# Patient Record
Sex: Male | Born: 1945
Health system: Southern US, Community
[De-identification: ages and names within clinical notes are randomized; demographics above are authoritative.]

## PROBLEM LIST (undated history)

## (undated) DIAGNOSIS — L720 Epidermal cyst: Secondary | ICD-10-CM

## (undated) DIAGNOSIS — Z79899 Other long term (current) drug therapy: Secondary | ICD-10-CM

## (undated) DIAGNOSIS — I2510 Atherosclerotic heart disease of native coronary artery without angina pectoris: Secondary | ICD-10-CM

## (undated) DIAGNOSIS — R0600 Dyspnea, unspecified: Secondary | ICD-10-CM

## (undated) DIAGNOSIS — I499 Cardiac arrhythmia, unspecified: Secondary | ICD-10-CM

## (undated) DIAGNOSIS — L02213 Cutaneous abscess of chest wall: Secondary | ICD-10-CM

## (undated) DIAGNOSIS — E785 Hyperlipidemia, unspecified: Secondary | ICD-10-CM

## (undated) DIAGNOSIS — K219 Gastro-esophageal reflux disease without esophagitis: Secondary | ICD-10-CM

## (undated) DIAGNOSIS — R222 Localized swelling, mass and lump, trunk: Secondary | ICD-10-CM

## (undated) DIAGNOSIS — I4891 Unspecified atrial fibrillation: Secondary | ICD-10-CM

## (undated) DIAGNOSIS — I517 Cardiomegaly: Secondary | ICD-10-CM

## (undated) DIAGNOSIS — S21109A Unspecified open wound of unspecified front wall of thorax without penetration into thoracic cavity, initial encounter: Secondary | ICD-10-CM

## (undated) DIAGNOSIS — C801 Malignant (primary) neoplasm, unspecified: Secondary | ICD-10-CM

## (undated) DIAGNOSIS — I25118 Atherosclerotic heart disease of native coronary artery with other forms of angina pectoris: Secondary | ICD-10-CM

## (undated) DIAGNOSIS — M1990 Unspecified osteoarthritis, unspecified site: Secondary | ICD-10-CM

## (undated) DIAGNOSIS — I480 Paroxysmal atrial fibrillation: Secondary | ICD-10-CM

## (undated) HISTORY — PX: CARDIAC CATHETERIZATION: SHX172

## (undated) HISTORY — DX: Epidermal cyst: L72.0

## (undated) HISTORY — PX: CHOLECYSTECTOMY: SHX55

## (undated) HISTORY — DX: Unspecified atrial fibrillation: I48.91

## (undated) HISTORY — PX: CYST EXCISION: SHX5701

## (undated) HISTORY — DX: Atherosclerotic heart disease of native coronary artery with other forms of angina pectoris: I25.118

## (undated) HISTORY — DX: Unspecified osteoarthritis, unspecified site: M19.90

## (undated) HISTORY — DX: Unspecified open wound of unspecified front wall of thorax without penetration into thoracic cavity, initial encounter: S21.109A

## (undated) HISTORY — DX: Cutaneous abscess of chest wall: L02.213

## (undated) HISTORY — DX: Paroxysmal atrial fibrillation: I48.0

## (undated) HISTORY — DX: Gastro-esophageal reflux disease without esophagitis: K21.9

## (undated) HISTORY — DX: Cardiomegaly: I51.7

## (undated) HISTORY — DX: Other long term (current) drug therapy: Z79.899

## (undated) HISTORY — DX: Hyperlipidemia, unspecified: E78.5

## (undated) HISTORY — PX: APPENDECTOMY: SHX54

## (undated) HISTORY — DX: Localized swelling, mass and lump, trunk: R22.2

---

## 2010-04-28 HISTORY — PX: LAPAROSCOPIC LYSIS INTESTINAL ADHESIONS: SUR778

## 2011-08-14 DIAGNOSIS — Z79899 Other long term (current) drug therapy: Secondary | ICD-10-CM | POA: Diagnosis not present

## 2011-08-14 DIAGNOSIS — E782 Mixed hyperlipidemia: Secondary | ICD-10-CM | POA: Diagnosis not present

## 2012-01-27 DIAGNOSIS — J209 Acute bronchitis, unspecified: Secondary | ICD-10-CM | POA: Diagnosis not present

## 2012-01-27 DIAGNOSIS — J019 Acute sinusitis, unspecified: Secondary | ICD-10-CM | POA: Diagnosis not present

## 2012-02-16 DIAGNOSIS — Z1211 Encounter for screening for malignant neoplasm of colon: Secondary | ICD-10-CM | POA: Diagnosis not present

## 2012-02-16 DIAGNOSIS — E782 Mixed hyperlipidemia: Secondary | ICD-10-CM | POA: Diagnosis not present

## 2012-02-16 DIAGNOSIS — Z23 Encounter for immunization: Secondary | ICD-10-CM | POA: Diagnosis not present

## 2012-02-16 DIAGNOSIS — Z79899 Other long term (current) drug therapy: Secondary | ICD-10-CM | POA: Diagnosis not present

## 2012-02-19 DIAGNOSIS — Z1211 Encounter for screening for malignant neoplasm of colon: Secondary | ICD-10-CM | POA: Diagnosis not present

## 2012-02-25 DIAGNOSIS — D1800 Hemangioma unspecified site: Secondary | ICD-10-CM | POA: Diagnosis not present

## 2012-02-25 DIAGNOSIS — D485 Neoplasm of uncertain behavior of skin: Secondary | ICD-10-CM | POA: Diagnosis not present

## 2012-05-03 DIAGNOSIS — B354 Tinea corporis: Secondary | ICD-10-CM | POA: Diagnosis not present

## 2012-05-03 DIAGNOSIS — N400 Enlarged prostate without lower urinary tract symptoms: Secondary | ICD-10-CM | POA: Diagnosis not present

## 2012-07-30 DIAGNOSIS — L259 Unspecified contact dermatitis, unspecified cause: Secondary | ICD-10-CM | POA: Diagnosis not present

## 2012-07-30 DIAGNOSIS — L820 Inflamed seborrheic keratosis: Secondary | ICD-10-CM | POA: Diagnosis not present

## 2012-09-15 DIAGNOSIS — K648 Other hemorrhoids: Secondary | ICD-10-CM | POA: Diagnosis not present

## 2012-10-01 DIAGNOSIS — D126 Benign neoplasm of colon, unspecified: Secondary | ICD-10-CM | POA: Diagnosis not present

## 2012-10-01 DIAGNOSIS — Z7982 Long term (current) use of aspirin: Secondary | ICD-10-CM | POA: Diagnosis not present

## 2012-10-01 DIAGNOSIS — Z1211 Encounter for screening for malignant neoplasm of colon: Secondary | ICD-10-CM | POA: Diagnosis not present

## 2012-10-01 DIAGNOSIS — Z79899 Other long term (current) drug therapy: Secondary | ICD-10-CM | POA: Diagnosis not present

## 2012-10-01 DIAGNOSIS — E7800 Pure hypercholesterolemia, unspecified: Secondary | ICD-10-CM | POA: Diagnosis not present

## 2012-10-01 DIAGNOSIS — K648 Other hemorrhoids: Secondary | ICD-10-CM | POA: Diagnosis not present

## 2012-10-27 DIAGNOSIS — Z79899 Other long term (current) drug therapy: Secondary | ICD-10-CM | POA: Diagnosis not present

## 2012-10-27 DIAGNOSIS — N139 Obstructive and reflux uropathy, unspecified: Secondary | ICD-10-CM | POA: Diagnosis not present

## 2012-10-27 DIAGNOSIS — N401 Enlarged prostate with lower urinary tract symptoms: Secondary | ICD-10-CM | POA: Diagnosis not present

## 2012-10-27 DIAGNOSIS — E782 Mixed hyperlipidemia: Secondary | ICD-10-CM | POA: Diagnosis not present

## 2012-10-27 DIAGNOSIS — N138 Other obstructive and reflux uropathy: Secondary | ICD-10-CM | POA: Diagnosis not present

## 2012-10-27 DIAGNOSIS — I4891 Unspecified atrial fibrillation: Secondary | ICD-10-CM | POA: Diagnosis not present

## 2012-12-23 DIAGNOSIS — E785 Hyperlipidemia, unspecified: Secondary | ICD-10-CM | POA: Diagnosis not present

## 2012-12-23 DIAGNOSIS — I4891 Unspecified atrial fibrillation: Secondary | ICD-10-CM | POA: Diagnosis not present

## 2013-02-11 DIAGNOSIS — Z23 Encounter for immunization: Secondary | ICD-10-CM | POA: Diagnosis not present

## 2013-03-01 DIAGNOSIS — H2510 Age-related nuclear cataract, unspecified eye: Secondary | ICD-10-CM | POA: Diagnosis not present

## 2013-03-01 DIAGNOSIS — H524 Presbyopia: Secondary | ICD-10-CM | POA: Diagnosis not present

## 2013-03-01 DIAGNOSIS — H547 Unspecified visual loss: Secondary | ICD-10-CM | POA: Diagnosis not present

## 2013-07-22 DIAGNOSIS — J301 Allergic rhinitis due to pollen: Secondary | ICD-10-CM | POA: Diagnosis not present

## 2013-11-10 DIAGNOSIS — N138 Other obstructive and reflux uropathy: Secondary | ICD-10-CM | POA: Diagnosis not present

## 2013-11-10 DIAGNOSIS — N139 Obstructive and reflux uropathy, unspecified: Secondary | ICD-10-CM | POA: Diagnosis not present

## 2013-11-10 DIAGNOSIS — L2089 Other atopic dermatitis: Secondary | ICD-10-CM | POA: Diagnosis not present

## 2013-11-10 DIAGNOSIS — N401 Enlarged prostate with lower urinary tract symptoms: Secondary | ICD-10-CM | POA: Diagnosis not present

## 2013-11-10 DIAGNOSIS — Z125 Encounter for screening for malignant neoplasm of prostate: Secondary | ICD-10-CM | POA: Diagnosis not present

## 2013-11-10 DIAGNOSIS — Z79899 Other long term (current) drug therapy: Secondary | ICD-10-CM | POA: Diagnosis not present

## 2013-11-10 DIAGNOSIS — E782 Mixed hyperlipidemia: Secondary | ICD-10-CM | POA: Diagnosis not present

## 2013-12-26 DIAGNOSIS — I4891 Unspecified atrial fibrillation: Secondary | ICD-10-CM | POA: Diagnosis not present

## 2013-12-26 DIAGNOSIS — E785 Hyperlipidemia, unspecified: Secondary | ICD-10-CM | POA: Diagnosis not present

## 2013-12-26 DIAGNOSIS — Z79899 Other long term (current) drug therapy: Secondary | ICD-10-CM | POA: Diagnosis not present

## 2014-03-02 DIAGNOSIS — H2513 Age-related nuclear cataract, bilateral: Secondary | ICD-10-CM | POA: Diagnosis not present

## 2014-03-10 DIAGNOSIS — Z23 Encounter for immunization: Secondary | ICD-10-CM | POA: Diagnosis not present

## 2014-08-25 DIAGNOSIS — L821 Other seborrheic keratosis: Secondary | ICD-10-CM | POA: Diagnosis not present

## 2014-08-25 DIAGNOSIS — B351 Tinea unguium: Secondary | ICD-10-CM | POA: Diagnosis not present

## 2014-08-25 DIAGNOSIS — L578 Other skin changes due to chronic exposure to nonionizing radiation: Secondary | ICD-10-CM | POA: Diagnosis not present

## 2014-08-25 DIAGNOSIS — L300 Nummular dermatitis: Secondary | ICD-10-CM | POA: Diagnosis not present

## 2014-08-25 DIAGNOSIS — L853 Xerosis cutis: Secondary | ICD-10-CM | POA: Diagnosis not present

## 2014-12-07 DIAGNOSIS — Z125 Encounter for screening for malignant neoplasm of prostate: Secondary | ICD-10-CM | POA: Diagnosis not present

## 2014-12-07 DIAGNOSIS — E782 Mixed hyperlipidemia: Secondary | ICD-10-CM | POA: Diagnosis not present

## 2014-12-07 DIAGNOSIS — N401 Enlarged prostate with lower urinary tract symptoms: Secondary | ICD-10-CM | POA: Diagnosis not present

## 2014-12-07 DIAGNOSIS — Z23 Encounter for immunization: Secondary | ICD-10-CM | POA: Diagnosis not present

## 2014-12-07 DIAGNOSIS — Z0000 Encounter for general adult medical examination without abnormal findings: Secondary | ICD-10-CM | POA: Diagnosis not present

## 2014-12-07 DIAGNOSIS — I4891 Unspecified atrial fibrillation: Secondary | ICD-10-CM | POA: Diagnosis not present

## 2014-12-07 DIAGNOSIS — Z79899 Other long term (current) drug therapy: Secondary | ICD-10-CM | POA: Diagnosis not present

## 2015-01-10 ENCOUNTER — Encounter: Payer: Self-pay | Admitting: Cardiology

## 2015-01-10 DIAGNOSIS — I480 Paroxysmal atrial fibrillation: Secondary | ICD-10-CM

## 2015-01-10 DIAGNOSIS — Z79899 Other long term (current) drug therapy: Secondary | ICD-10-CM | POA: Insufficient documentation

## 2015-01-10 HISTORY — DX: Paroxysmal atrial fibrillation: I48.0

## 2015-01-10 HISTORY — DX: Other long term (current) drug therapy: Z79.899

## 2015-03-08 DIAGNOSIS — H2513 Age-related nuclear cataract, bilateral: Secondary | ICD-10-CM | POA: Diagnosis not present

## 2015-03-20 DIAGNOSIS — J069 Acute upper respiratory infection, unspecified: Secondary | ICD-10-CM | POA: Diagnosis not present

## 2015-04-09 DIAGNOSIS — L300 Nummular dermatitis: Secondary | ICD-10-CM | POA: Diagnosis not present

## 2015-09-26 DIAGNOSIS — K648 Other hemorrhoids: Secondary | ICD-10-CM | POA: Diagnosis not present

## 2015-10-12 DIAGNOSIS — Z9049 Acquired absence of other specified parts of digestive tract: Secondary | ICD-10-CM | POA: Diagnosis not present

## 2015-10-12 DIAGNOSIS — Z86010 Personal history of colonic polyps: Secondary | ICD-10-CM | POA: Diagnosis not present

## 2015-10-12 DIAGNOSIS — I4891 Unspecified atrial fibrillation: Secondary | ICD-10-CM | POA: Diagnosis not present

## 2015-10-12 DIAGNOSIS — K648 Other hemorrhoids: Secondary | ICD-10-CM | POA: Diagnosis not present

## 2015-10-12 DIAGNOSIS — Z1211 Encounter for screening for malignant neoplasm of colon: Secondary | ICD-10-CM | POA: Diagnosis not present

## 2015-10-12 DIAGNOSIS — Z79899 Other long term (current) drug therapy: Secondary | ICD-10-CM | POA: Diagnosis not present

## 2015-10-12 DIAGNOSIS — K644 Residual hemorrhoidal skin tags: Secondary | ICD-10-CM | POA: Diagnosis not present

## 2015-11-14 DIAGNOSIS — L723 Sebaceous cyst: Secondary | ICD-10-CM | POA: Diagnosis not present

## 2015-12-04 DIAGNOSIS — L02213 Cutaneous abscess of chest wall: Secondary | ICD-10-CM | POA: Insufficient documentation

## 2015-12-04 DIAGNOSIS — L720 Epidermal cyst: Secondary | ICD-10-CM

## 2015-12-04 DIAGNOSIS — L723 Sebaceous cyst: Secondary | ICD-10-CM | POA: Diagnosis not present

## 2015-12-04 DIAGNOSIS — R222 Localized swelling, mass and lump, trunk: Secondary | ICD-10-CM

## 2015-12-04 HISTORY — DX: Localized swelling, mass and lump, trunk: R22.2

## 2015-12-04 HISTORY — DX: Epidermal cyst: L72.0

## 2015-12-04 HISTORY — DX: Cutaneous abscess of chest wall: L02.213

## 2015-12-18 DIAGNOSIS — L723 Sebaceous cyst: Secondary | ICD-10-CM | POA: Diagnosis not present

## 2016-01-03 DIAGNOSIS — L2089 Other atopic dermatitis: Secondary | ICD-10-CM | POA: Diagnosis not present

## 2016-01-03 DIAGNOSIS — Z125 Encounter for screening for malignant neoplasm of prostate: Secondary | ICD-10-CM | POA: Diagnosis not present

## 2016-01-03 DIAGNOSIS — E782 Mixed hyperlipidemia: Secondary | ICD-10-CM | POA: Diagnosis not present

## 2016-01-03 DIAGNOSIS — I4891 Unspecified atrial fibrillation: Secondary | ICD-10-CM | POA: Diagnosis not present

## 2016-01-03 DIAGNOSIS — L409 Psoriasis, unspecified: Secondary | ICD-10-CM | POA: Diagnosis not present

## 2016-01-03 DIAGNOSIS — Z0000 Encounter for general adult medical examination without abnormal findings: Secondary | ICD-10-CM | POA: Diagnosis not present

## 2016-01-03 DIAGNOSIS — Z79899 Other long term (current) drug therapy: Secondary | ICD-10-CM | POA: Diagnosis not present

## 2016-01-15 DIAGNOSIS — L723 Sebaceous cyst: Secondary | ICD-10-CM | POA: Diagnosis not present

## 2016-01-19 DIAGNOSIS — M5432 Sciatica, left side: Secondary | ICD-10-CM | POA: Diagnosis not present

## 2016-03-10 ENCOUNTER — Encounter: Payer: Self-pay | Admitting: Cardiology

## 2016-03-10 DIAGNOSIS — I480 Paroxysmal atrial fibrillation: Secondary | ICD-10-CM | POA: Diagnosis not present

## 2016-03-10 DIAGNOSIS — Z6829 Body mass index (BMI) 29.0-29.9, adult: Secondary | ICD-10-CM | POA: Diagnosis not present

## 2016-03-10 DIAGNOSIS — Z79899 Other long term (current) drug therapy: Secondary | ICD-10-CM | POA: Diagnosis not present

## 2016-03-11 DIAGNOSIS — L723 Sebaceous cyst: Secondary | ICD-10-CM | POA: Diagnosis not present

## 2016-03-11 DIAGNOSIS — L720 Epidermal cyst: Secondary | ICD-10-CM | POA: Diagnosis not present

## 2016-03-13 DIAGNOSIS — Z79899 Other long term (current) drug therapy: Secondary | ICD-10-CM | POA: Diagnosis not present

## 2016-03-13 DIAGNOSIS — I480 Paroxysmal atrial fibrillation: Secondary | ICD-10-CM | POA: Diagnosis not present

## 2016-03-13 DIAGNOSIS — H2513 Age-related nuclear cataract, bilateral: Secondary | ICD-10-CM | POA: Diagnosis not present

## 2016-03-13 DIAGNOSIS — H524 Presbyopia: Secondary | ICD-10-CM | POA: Diagnosis not present

## 2016-03-25 DIAGNOSIS — R222 Localized swelling, mass and lump, trunk: Secondary | ICD-10-CM | POA: Diagnosis not present

## 2016-04-01 DIAGNOSIS — S21109A Unspecified open wound of unspecified front wall of thorax without penetration into thoracic cavity, initial encounter: Secondary | ICD-10-CM

## 2016-04-01 HISTORY — DX: Unspecified open wound of unspecified front wall of thorax without penetration into thoracic cavity, initial encounter: S21.109A

## 2016-04-29 DIAGNOSIS — L723 Sebaceous cyst: Secondary | ICD-10-CM | POA: Diagnosis not present

## 2016-04-29 DIAGNOSIS — S21109S Unspecified open wound of unspecified front wall of thorax without penetration into thoracic cavity, sequela: Secondary | ICD-10-CM | POA: Diagnosis not present

## 2016-09-15 DIAGNOSIS — Z6828 Body mass index (BMI) 28.0-28.9, adult: Secondary | ICD-10-CM | POA: Diagnosis not present

## 2016-09-15 DIAGNOSIS — R1032 Left lower quadrant pain: Secondary | ICD-10-CM | POA: Diagnosis not present

## 2017-01-05 DIAGNOSIS — J309 Allergic rhinitis, unspecified: Secondary | ICD-10-CM | POA: Diagnosis not present

## 2017-01-05 DIAGNOSIS — Z23 Encounter for immunization: Secondary | ICD-10-CM | POA: Diagnosis not present

## 2017-01-05 DIAGNOSIS — Z6828 Body mass index (BMI) 28.0-28.9, adult: Secondary | ICD-10-CM | POA: Diagnosis not present

## 2017-01-15 DIAGNOSIS — E785 Hyperlipidemia, unspecified: Secondary | ICD-10-CM

## 2017-01-15 DIAGNOSIS — I517 Cardiomegaly: Secondary | ICD-10-CM

## 2017-01-15 DIAGNOSIS — M1990 Unspecified osteoarthritis, unspecified site: Secondary | ICD-10-CM

## 2017-01-15 DIAGNOSIS — K219 Gastro-esophageal reflux disease without esophagitis: Secondary | ICD-10-CM

## 2017-01-15 HISTORY — DX: Gastro-esophageal reflux disease without esophagitis: K21.9

## 2017-01-15 HISTORY — DX: Cardiomegaly: I51.7

## 2017-01-15 HISTORY — DX: Hyperlipidemia, unspecified: E78.5

## 2017-01-15 HISTORY — DX: Unspecified osteoarthritis, unspecified site: M19.90

## 2017-02-16 ENCOUNTER — Telehealth: Payer: Self-pay | Admitting: Cardiology

## 2017-02-16 ENCOUNTER — Other Ambulatory Visit: Payer: Self-pay

## 2017-02-16 MED ORDER — METOPROLOL SUCCINATE ER 50 MG PO TB24: 50.0000 mg | ORAL_TABLET | Freq: Every day | ORAL | 0 refills | Status: DC

## 2017-02-16 NOTE — Telephone Encounter (Signed)
°*  STAT* If patient is at the pharmacy, call can be transferred to refill team.   1. Which medications need to be refilled? (please list name of each medication and dose if known) Metoprolol 50mg   2. Which pharmacy/location (including street and city if local pharmacy) is medication to be sent to?  [facility]   3. Do they need a 30 day or 90 day supply?30  Patient is scheduled for 03/11/17

## 2017-02-16 NOTE — Telephone Encounter (Signed)
Refill sent.

## 2017-03-11 ENCOUNTER — Ambulatory Visit: Payer: Self-pay | Admitting: Cardiology

## 2017-03-12 ENCOUNTER — Ambulatory Visit (INDEPENDENT_AMBULATORY_CARE_PROVIDER_SITE_OTHER): Payer: Medicare Other | Admitting: Cardiology

## 2017-03-12 ENCOUNTER — Encounter: Payer: Self-pay | Admitting: Cardiology

## 2017-03-12 VITALS — BP 130/80 | HR 60 | Ht 72.0 in | Wt 223.0 lb

## 2017-03-12 DIAGNOSIS — Z79899 Other long term (current) drug therapy: Secondary | ICD-10-CM | POA: Diagnosis not present

## 2017-03-12 DIAGNOSIS — I480 Paroxysmal atrial fibrillation: Secondary | ICD-10-CM | POA: Diagnosis not present

## 2017-03-12 DIAGNOSIS — E785 Hyperlipidemia, unspecified: Secondary | ICD-10-CM

## 2017-03-12 MED ORDER — METOPROLOL SUCCINATE ER 50 MG PO TB24: 50.0000 mg | ORAL_TABLET | ORAL | 0 refills | Status: DC

## 2017-03-12 MED ORDER — FLECAINIDE ACETATE 50 MG PO TABS: 50.0000 mg | ORAL_TABLET | Freq: Two times a day (BID) | ORAL | 3 refills | Status: DC

## 2017-03-12 MED ORDER — SIMVASTATIN 40 MG PO TABS: 40.0000 mg | ORAL_TABLET | Freq: Every day | ORAL | 3 refills | Status: DC

## 2017-03-12 NOTE — Progress Notes (Signed)
Cardiology Office Note:    Date:  03/12/2017   ID:  Steve Frye, DOB 07-08-1945, MRN [id number]  PCP:  Myer Peer, MD  Cardiologist:  Shirlee More, MD    Referring MD: Myer Peer, MD    ASSESSMENT:    1. PAF (paroxysmal atrial fibrillation) ([place])   2. High risk medication use   3. Hyperlipidemia, unspecified hyperlipidemia type    PLAN:    In order of problems listed above:   1. Stable remains in sinus rhythm on the minimum dose of flecainide with a resting heart rate of 50 10 reduce his beta-blocker to every other day I have asked him to check heart rate at home at least once a week and contact me if he is having persistent bradycardia.  EKG shows no evidence of flecainide toxicity drug level be checked today. 2. Stable no evidence of toxicity on EKG check flecainide level 3. Stable check CMP for safety lipid profile for efficacy and continue his intermediate intensity statin.    Next appointment: One year in the [place] office   Medication Adjustments/Labs and Tests Ordered: Current medicines are reviewed at length with the patient today.  Concerns regarding medicines are outlined above.  Orders Placed This Encounter  Procedures  . Flecainide level  . Comprehensive Metabolic Panel (CMET)  . Lipid Profile  . EKG 12-Lead   Meds ordered this encounter  Medications  . DISCONTD: metoprolol succinate (TOPROL-XL) 50 MG 24 hr tablet    Sig: Take 1 tablet (50 mg total) every other day by mouth.    Dispense:  30 tablet    Refill:  0  . simvastatin (ZOCOR) 40 MG tablet    Sig: Take 1 tablet (40 mg total) daily by mouth.    Dispense:  90 tablet    Refill:  3  . metoprolol succinate (TOPROL-XL) 50 MG 24 hr tablet    Sig: Take 1 tablet (50 mg total) every other day by mouth.    Dispense:  90 tablet    Refill:  0  . flecainide (TAMBOCOR) 50 MG tablet    Sig: Take 1 tablet (50 mg total) 2 (two) times daily by mouth.    Dispense:  180 tablet    Refill:  3     Chief Complaint  Patient presents with  . Follow-up    atrial fibrillation    History of Present Illness:    Steve Frye is a 71 y.o. male with a hx of paroxysmal atrial fibrillation CHADS vasc 2 =1  last seen 1 year ago.. Compliance with diet, lifestyle and medications: Yes He has had no palpitation rapid heart rhythms syncope exercise intolerance chest pain shortness of breath or TIA.  He has had no perceived side effects from his cardiac medications.  He is unaware of a resting heart rate of 51 bpm today Past Medical History:  Diagnosis Date  . Arthritis 01/15/2017  . Chest mass 12/04/2015  . Cutaneous abscess of chest wall 12/04/2015  . Epidermoid cyst of skin 12/04/2015  . GERD (gastroesophageal reflux disease) 01/15/2017  . High risk medication use 01/10/2015   Overview:  Overview:  flecanide  . Hyperlipidemia 01/15/2017  . LAE (left atrial enlargement) 01/15/2017  . Open wound of chest wall 04/01/2016  . PAF (paroxysmal atrial fibrillation) ([place]) 01/10/2015   Overview:  CHADS2 vasc score=1    Past Surgical History:  Procedure Laterality Date  . APPENDECTOMY    . CHOLECYSTECTOMY    . LAPAROSCOPIC LYSIS INTESTINAL  ADHESIONS  2012    Current Medications: Current Meds  Medication Sig  . aspirin EC 81 MG tablet Take 81 mg daily by mouth.  . bifidobacterium infantis (ALIGN) capsule Take 1 capsule daily by mouth.  . finasteride (PROSCAR) 5 MG tablet Take 5 mg daily by mouth.  . flecainide (TAMBOCOR) 50 MG tablet Take 1 tablet (50 mg total) 2 (two) times daily by mouth.  . fluticasone (FLONASE) 50 MCG/ACT nasal spray Place 1 spray 2 (two) times daily into both nostrils.  . metoprolol succinate (TOPROL-XL) 50 MG 24 hr tablet Take 1 tablet (50 mg total) every other day by mouth.  . Nutritional Supplements (FRUIT & VEGETABLE DAILY PO) Take 2 tablets daily by mouth.  . simvastatin (ZOCOR) 40 MG tablet Take 1 tablet (40 mg total) daily by mouth.  . [DISCONTINUED] flecainide (TAMBOCOR)  50 MG tablet Take 50 mg 2 (two) times daily by mouth.  . [DISCONTINUED] metoprolol succinate (TOPROL-XL) 50 MG 24 hr tablet Take 1 tablet (50 mg total) by mouth daily.  . [DISCONTINUED] metoprolol succinate (TOPROL-XL) 50 MG 24 hr tablet Take 1 tablet (50 mg total) every other day by mouth.  . [DISCONTINUED] simvastatin (ZOCOR) 40 MG tablet Take 1 tablet daily by mouth.     Allergies:   Penicillins   Social History   Socioeconomic History  . Marital status: Married    Spouse name: None  . Number of children: None  . Years of education: None  . Highest education level: None  Social Needs  . Financial resource strain: None  . Food insecurity - worry: None  . Food insecurity - inability: None  . Transportation needs - medical: None  . Transportation needs - non-medical: None  Occupational History  . None  Tobacco Use  . Smoking status: Former Smoker    Years: 20.00    Types: Cigarettes    Last attempt to quit: 01/10/1979    Years since quitting: 38.1  . Smokeless tobacco: Never Used  Substance and Sexual Activity  . Alcohol use: No  . Drug use: No  . Sexual activity: None  Other Topics Concern  . None  Social History Narrative  . None     Family History: The patient's family history includes Heart disease in his father; Heart failure in his father. ROS:   Please see the history of present illness.    All other systems reviewed and are negative.  EKGs/Labs/Other Studies Reviewed:    The following studies were reviewed today:  EKG:  EKG ordered today.  The ekg ordered today demonstrates sinus rhythm normal including conduction intervals  Recent Labs: No results found for requested labs within last 8760 hours.  Recent Lipid Panel No results found for: CHOL, TRIG, HDL, CHOLHDL, VLDL, LDLCALC, LDLDIRECT  Physical Exam:    VS:  BP 130/80 (BP Location: Right Arm, Patient Position: Sitting)   Pulse 60   Ht 6' (1.829 m)   Wt 223 lb (101.2 kg)   SpO2 97%   BMI 30.24  kg/m     Wt Readings from Last 3 Encounters:  03/12/17 223 lb (101.2 kg)     GEN:  Well nourished, well developed in no acute distress HEENT: Normal NECK: No JVD; No carotid bruits LYMPHATICS: No lymphadenopathy CARDIAC: RRR, no murmurs, rubs, gallops RESPIRATORY:  Clear to auscultation without rales, wheezing or rhonchi  ABDOMEN: Soft, non-tender, non-distended MUSCULOSKELETAL:  No edema; No deformity  SKIN: Warm and dry NEUROLOGIC:  Alert and oriented x 3  PSYCHIATRIC:  Normal affect    Signed, Shirlee More, MD  03/12/2017 10:04 AM    [place]

## 2017-03-12 NOTE — Patient Instructions (Signed)
Medication Instructions:  Your physician has recommended you make the following change in your medication:  DECREASE metoprolol to every other day  Labwork: Your physician recommends that you return for lab work in: today. CMP, lipid, flecainide  Testing/Procedures: You had an EKG today.  Follow-Up: Your physician wants you to follow-up in: 1 year. You will receive a reminder letter in the mail two months in advance. If you don't receive a letter, please call our office to schedule the follow-up appointment.  Any Other Special Instructions Will Be Listed Below (If Applicable).     If you need a refill on your cardiac medications before your next appointment, please call your pharmacy.

## 2017-03-14 LAB — LIPID PANEL
CHOL/HDL RATIO: 3.8 ratio (ref 0.0–5.0)
Cholesterol, Total: 150 mg/dL (ref 100–199)
HDL: 40 mg/dL (ref >39)
LDL Calculated: 86 mg/dL (ref 0–99)
Triglycerides: 122 mg/dL (ref 0–149)
VLDL CHOLESTEROL CAL: 24 mg/dL (ref 5–40)

## 2017-03-14 LAB — COMPREHENSIVE METABOLIC PANEL
ALT: 29 IU/L (ref 0–44)
AST: 21 IU/L (ref 0–40)
Albumin/Globulin Ratio: 1.6 (ref 1.2–2.2)
Albumin: 4.2 g/dL (ref 3.5–4.8)
Alkaline Phosphatase: 73 IU/L (ref 39–117)
BUN/Creatinine Ratio: 9 — ABNORMAL LOW (ref 10–24)
BUN: 9 mg/dL (ref 8–27)
Bilirubin Total: 0.8 mg/dL (ref 0.0–1.2)
CALCIUM: 8.9 mg/dL (ref 8.6–10.2)
CO2: 24 mmol/L (ref 20–29)
Chloride: 103 mmol/L (ref 96–106)
Creatinine, Ser: 0.95 mg/dL (ref 0.76–1.27)
GFR, EST AFRICAN AMERICAN: 92 mL/min/{1.73_m2} (ref >59)
GFR, EST NON AFRICAN AMERICAN: 80 mL/min/{1.73_m2} (ref >59)
GLUCOSE: 106 mg/dL — AB (ref 65–99)
Globulin, Total: 2.6 g/dL (ref 1.5–4.5)
Potassium: 4.5 mmol/L (ref 3.5–5.2)
Sodium: 138 mmol/L (ref 134–144)
TOTAL PROTEIN: 6.8 g/dL (ref 6.0–8.5)

## 2017-03-14 LAB — FLECAINIDE LEVEL: Flecainide: 0.21 ug/mL (ref 0.20–1.00)

## 2017-07-02 DIAGNOSIS — H2513 Age-related nuclear cataract, bilateral: Secondary | ICD-10-CM | POA: Diagnosis not present

## 2017-07-02 DIAGNOSIS — H524 Presbyopia: Secondary | ICD-10-CM | POA: Diagnosis not present

## 2017-07-14 DIAGNOSIS — J310 Chronic rhinitis: Secondary | ICD-10-CM | POA: Diagnosis not present

## 2017-07-14 DIAGNOSIS — J0100 Acute maxillary sinusitis, unspecified: Secondary | ICD-10-CM | POA: Diagnosis not present

## 2017-07-14 DIAGNOSIS — J309 Allergic rhinitis, unspecified: Secondary | ICD-10-CM | POA: Diagnosis not present

## 2017-07-14 DIAGNOSIS — K219 Gastro-esophageal reflux disease without esophagitis: Secondary | ICD-10-CM | POA: Diagnosis not present

## 2017-08-14 ENCOUNTER — Other Ambulatory Visit: Payer: Self-pay

## 2017-08-14 ENCOUNTER — Telehealth: Payer: Self-pay | Admitting: Cardiology

## 2017-08-14 DIAGNOSIS — I480 Paroxysmal atrial fibrillation: Secondary | ICD-10-CM

## 2017-08-14 MED ORDER — METOPROLOL SUCCINATE ER 50 MG PO TB24: 50.0000 mg | ORAL_TABLET | ORAL | 1 refills | Status: DC

## 2017-08-14 NOTE — Telephone Encounter (Signed)
[facility] [address] [facility] [place]

## 2017-08-14 NOTE — Telephone Encounter (Signed)
Med refill sent

## 2017-08-21 DIAGNOSIS — L259 Unspecified contact dermatitis, unspecified cause: Secondary | ICD-10-CM | POA: Diagnosis not present

## 2017-09-28 DIAGNOSIS — C44619 Basal cell carcinoma of skin of left upper limb, including shoulder: Secondary | ICD-10-CM | POA: Diagnosis not present

## 2017-09-28 DIAGNOSIS — R233 Spontaneous ecchymoses: Secondary | ICD-10-CM | POA: Diagnosis not present

## 2017-10-07 DIAGNOSIS — C44619 Basal cell carcinoma of skin of left upper limb, including shoulder: Secondary | ICD-10-CM | POA: Diagnosis not present

## 2018-02-08 DIAGNOSIS — Z6827 Body mass index (BMI) 27.0-27.9, adult: Secondary | ICD-10-CM | POA: Diagnosis not present

## 2018-02-08 DIAGNOSIS — Z23 Encounter for immunization: Secondary | ICD-10-CM | POA: Diagnosis not present

## 2018-02-08 DIAGNOSIS — J309 Allergic rhinitis, unspecified: Secondary | ICD-10-CM | POA: Diagnosis not present

## 2018-02-08 DIAGNOSIS — Z0000 Encounter for general adult medical examination without abnormal findings: Secondary | ICD-10-CM | POA: Diagnosis not present

## 2018-02-17 DIAGNOSIS — J301 Allergic rhinitis due to pollen: Secondary | ICD-10-CM | POA: Diagnosis not present

## 2018-03-18 ENCOUNTER — Other Ambulatory Visit: Payer: Self-pay

## 2018-03-18 MED ORDER — SIMVASTATIN 40 MG PO TABS: 40.0000 mg | ORAL_TABLET | Freq: Every day | ORAL | 0 refills | Status: DC

## 2018-03-19 ENCOUNTER — Other Ambulatory Visit: Payer: Self-pay

## 2018-03-24 ENCOUNTER — Telehealth: Payer: Self-pay

## 2018-03-24 MED ORDER — FLECAINIDE ACETATE 50 MG PO TABS: 50.0000 mg | ORAL_TABLET | Freq: Two times a day (BID) | ORAL | 0 refills | Status: DC

## 2018-03-24 NOTE — Telephone Encounter (Signed)
Rx sent to pharmacy as requested.

## 2018-05-04 NOTE — Progress Notes (Signed)
Cardiology Office Note:    Date:  05/05/2018   ID:  Steve Frye, DOB 12-Feb-1946, MRN [id number]  PCP:  Myer Peer, MD  Cardiologist:  Shirlee More, MD    Referring MD: Myer Peer, MD    ASSESSMENT:    1. PAF (paroxysmal atrial fibrillation) ([place])   2. High risk medication use   3. Hyperlipidemia, unspecified hyperlipidemia type    PLAN:    In order of problems listed above:  1. Stable no clinical recurrence tolerates flecainide continue the same no evidence of toxicity and EKG and presently is not anticoagulated and I would not consider unless he has clinical recurrence.  He is a very low stroke risk and continue to take low-dose aspirin. 2. Stable no evidence of toxicity check CMP for liver function 3. Continue his statin he is well over a year from his last labs I will draw a CMP and lipid profile that can be shared with his primary care physician and continue his intermediate intensity statin   Next appointment: 1 year   Medication Adjustments/Labs and Tests Ordered: Current medicines are reviewed at length with the patient today.  Concerns regarding medicines are outlined above.  Orders Placed This Encounter  Procedures  . Comp Met (CMET)  . Lipid Profile  . EKG 12-Lead   No orders of the defined types were placed in this encounter.   Chief Complaint  Patient presents with  . Follow-up    on flecanide  . Atrial Fibrillation    History of Present Illness:    Steve Frye is a 73 y.o. male with a hx of PAF CHADSvasc 2 of 1 on flecanide last seen 03/12/17. Compliance with diet, lifestyle and medications: Yes  He is in a good lifestyle and exercises regularly and has had no exercise intolerance edema shortness of breath chest pain palpitations syncope or TIA.  He tolerates flecainide without side effects and has had no indication of recurrent atrial fibrillation Past Medical History:  Diagnosis Date  . Arthritis 01/15/2017  . Chest mass 12/04/2015  .  Cutaneous abscess of chest wall 12/04/2015  . Epidermoid cyst of skin 12/04/2015  . GERD (gastroesophageal reflux disease) 01/15/2017  . High risk medication use 01/10/2015   Overview:  Overview:  flecanide  . Hyperlipidemia 01/15/2017  . LAE (left atrial enlargement) 01/15/2017  . Open wound of chest wall 04/01/2016  . PAF (paroxysmal atrial fibrillation) ([place]) 01/10/2015   Overview:  CHADS2 vasc score=1    Past Surgical History:  Procedure Laterality Date  . APPENDECTOMY    . CHOLECYSTECTOMY    . CYST EXCISION    . LAPAROSCOPIC LYSIS INTESTINAL ADHESIONS  2012    Current Medications: Current Meds  Medication Sig  . aspirin EC 81 MG tablet Take 81 mg daily by mouth.  Marland Kitchen azelastine (ASTELIN) 0.1 % nasal spray Place 1 spray into both nostrils 2 (two) times daily.  . bifidobacterium infantis (ALIGN) capsule Take 1 capsule daily by mouth.  . fexofenadine (ALLEGRA) 180 MG tablet Take 180 mg by mouth daily.  . finasteride (PROSCAR) 5 MG tablet Take 5 mg daily by mouth.  . flecainide (TAMBOCOR) 50 MG tablet Take 1 tablet (50 mg total) by mouth 2 (two) times daily.  . fluticasone (FLONASE) 50 MCG/ACT nasal spray Place 1 spray 2 (two) times daily into both nostrils.  . metoprolol succinate (TOPROL-XL) 50 MG 24 hr tablet Take 1 tablet (50 mg total) by mouth every other day.  . montelukast (SINGULAIR) 10  MG tablet Take 10 mg by mouth at bedtime.  . Nutritional Supplements (FRUIT & VEGETABLE DAILY PO) Take 2 tablets daily by mouth.  . simvastatin (ZOCOR) 40 MG tablet Take 1 tablet (40 mg total) by mouth daily.     Allergies:   Penicillins   Social History   Socioeconomic History  . Marital status: Married    Spouse name: Not on file  . Number of children: Not on file  . Years of education: Not on file  . Highest education level: Not on file  Occupational History  . Not on file  Social Needs  . Financial resource strain: Not on file  . Food insecurity:    Worry: Not on file    Inability:  Not on file  . Transportation needs:    Medical: Not on file    Non-medical: Not on file  Tobacco Use  . Smoking status: Former Smoker    Years: 20.00    Types: Cigarettes    Last attempt to quit: 01/10/1979    Years since quitting: 39.3  . Smokeless tobacco: Never Used  Substance and Sexual Activity  . Alcohol use: No  . Drug use: No  . Sexual activity: Not on file  Lifestyle  . Physical activity:    Days per week: Not on file    Minutes per session: Not on file  . Stress: Not on file  Relationships  . Social connections:    Talks on phone: Not on file    Gets together: Not on file    Attends religious service: Not on file    Active member of club or organization: Not on file    Attends meetings of clubs or organizations: Not on file    Relationship status: Not on file  Other Topics Concern  . Not on file  Social History Narrative  . Not on file     Family History: The patient's family history includes Heart disease in his father; Heart failure in his father. ROS:   Please see the history of present illness.    All other systems reviewed and are negative.  EKGs/Labs/Other Studies Reviewed:    The following studies were reviewed today:  EKG:  EKG ordered today.  The ekg ordered today demonstrates SRTh and normal EKG  Recent Labs: No results found for requested labs within last 8760 hours.  Recent Lipid Panel    Component Value Date/Time   CHOL 150 03/12/2017 1034   TRIG 122 03/12/2017 1034   HDL 40 03/12/2017 1034   CHOLHDL 3.8 03/12/2017 1034   LDLCALC 86 03/12/2017 1034    Physical Exam:    VS:  BP 130/72 (BP Location: Right Arm, Patient Position: Sitting, Cuff Size: Normal)   Pulse 64   Ht 6' (1.829 m)   Wt 217 lb 2 oz (98.5 kg)   SpO2 96%   BMI 29.45 kg/m     Wt Readings from Last 3 Encounters:  05/05/18 217 lb 2 oz (98.5 kg)  03/12/17 223 lb (101.2 kg)     GEN:  Well nourished, well developed in no acute distress HEENT: Normal NECK: No  JVD; No carotid bruits LYMPHATICS: No lymphadenopathy CARDIAC: RRR, no murmurs, rubs, gallops RESPIRATORY:  Clear to auscultation without rales, wheezing or rhonchi  ABDOMEN: Soft, non-tender, non-distended MUSCULOSKELETAL:  No edema; No deformity  SKIN: Warm and dry NEUROLOGIC:  Alert and oriented x 3 PSYCHIATRIC:  Normal affect    Signed, Shirlee More, MD  05/05/2018 5:37 PM  [facility]

## 2018-05-05 ENCOUNTER — Ambulatory Visit (INDEPENDENT_AMBULATORY_CARE_PROVIDER_SITE_OTHER): Payer: Medicare Other | Admitting: Cardiology

## 2018-05-05 ENCOUNTER — Encounter: Payer: Self-pay | Admitting: Cardiology

## 2018-05-05 VITALS — BP 130/72 | HR 64 | Ht 72.0 in | Wt 217.1 lb

## 2018-05-05 DIAGNOSIS — Z79899 Other long term (current) drug therapy: Secondary | ICD-10-CM

## 2018-05-05 DIAGNOSIS — I480 Paroxysmal atrial fibrillation: Secondary | ICD-10-CM

## 2018-05-05 DIAGNOSIS — E785 Hyperlipidemia, unspecified: Secondary | ICD-10-CM

## 2018-05-05 NOTE — Patient Instructions (Addendum)
Medication Instructions:  Your physician recommends that you continue on your current medications as directed. Please refer to the Current Medication list given to you today.  If you need a refill on your cardiac medications before your next appointment, please call your pharmacy.   Lab work: You had a CMP drawn today.  If you have labs (blood work) drawn today and your tests are completely normal, you will receive your results only by: Marland Kitchen MyChart Message (if you have MyChart) OR . A paper copy in the mail If you have any lab test that is abnormal or we need to change your treatment, we will call you to review the results.  Testing/Procedures: None  Follow-Up: At [facility], you and your health needs are our priority.  As part of our continuing mission to provide you with exceptional heart care, we have created designated Provider Care Teams.  These Care Teams include your primary Cardiologist (physician) and Advanced Practice Providers (APPs -  Physician Assistants and Nurse Practitioners) who all work together to provide you with the care you need, when you need it. You will need a follow up appointment in 1 years.  Please call our office 2 months in advance to schedule this appointment.     Atrial Fibrillation  Atrial fibrillation is a type of heartbeat that is irregular or fast (rapid). If you have this condition, your heart beats without any order. This makes it hard for your heart to pump blood in a normal way. Having this condition gives you more risk for stroke, heart failure, and other heart problems. Atrial fibrillation may start all of a sudden and then stop on its own, or it may become a long-lasting problem. What are the causes? This condition may be caused by heart conditions, such as:  High blood pressure.  Heart failure.  Heart valve disease.  Heart surgery. Other causes include:  Pneumonia.  Obstructive sleep apnea.  Lung cancer.  Thyroid  disease.  Drinking too much alcohol. Sometimes the cause is not known. What increases the risk? You are more likely to develop this condition if:  You smoke.  You are older.  You have diabetes.  You are overweight.  You have a family history of this condition.  You exercise often and hard. What are the signs or symptoms? Common symptoms of this condition include:  A feeling like your heart is beating very fast.  Chest pain.  Feeling short of breath.  Feeling light-headed or weak.  Getting tired easily. Follow these instructions at home: Medicines  Take over-the-counter and prescription medicines only as told by your doctor.  If your doctor gives you a blood-thinning medicine, take it exactly as told. Taking too much of it can cause bleeding. Taking too little of it does not protect you against clots. Clots can cause a stroke. Lifestyle      Do not use any tobacco products. These include cigarettes, chewing tobacco, and e-cigarettes. If you need help quitting, ask your doctor.  Do not drink alcohol.  Do not drink beverages that have caffeine. These include coffee, soda, and tea.  Follow diet instructions as told by your doctor.  Exercise regularly as told by your doctor. General instructions  If you have a condition that causes breathing to stop for a short period of time (apnea), treat it as told by your doctor.  Keep a healthy weight. Do not use diet pills unless your doctor says they are safe for you. Diet pills may make heart  problems worse.  Keep all follow-up visits as told by your doctor. This is important. Contact a doctor if:  You notice a change in the speed, rhythm, or strength of your heartbeat.  You are taking a blood-thinning medicine and you see more bruising.  You get tired more easily when you move or exercise.  You have a sudden change in weight. Get help right away if:   You have pain in your chest or your belly (abdomen).  You  have trouble breathing.  You have blood in your vomit, poop, or pee (urine).  You have any signs of a stroke. "BE FAST" is an easy way to remember the main warning signs: ? B - Balance. Signs are dizziness, sudden trouble walking, or loss of balance. ? E - Eyes. Signs are trouble seeing or a change in how you see. ? F - Face. Signs are sudden weakness or loss of feeling in the face, or the face or eyelid drooping on one side. ? A - Arms. Signs are weakness or loss of feeling in an arm. This happens suddenly and usually on one side of the body. ? S - Speech. Signs are sudden trouble speaking, slurred speech, or trouble understanding what people say. ? T - Time. Time to call emergency services. Write down what time symptoms started.  You have other signs of a stroke, such as: ? A sudden, very bad headache with no known cause. ? Feeling sick to your stomach (nausea). ? Throwing up (vomiting). ? Jerky movements you cannot control (seizure). These symptoms may be an emergency. Do not wait to see if the symptoms will go away. Get medical help right away. Call your local emergency services (911 in the [place] Do not drive yourself to the hospital. Summary  Atrial fibrillation is a type of heartbeat that is irregular or fast (rapid).  You are at higher risk of this condition if you smoke, are older, have diabetes, or are overweight.  Follow your doctor's instructions about medicines, diet, exercise, and follow-up visits.  Get help right away if you think that you have signs of a stroke. This information is not intended to replace advice given to you by your health care provider. Make sure you discuss any questions you have with your health care provider. Document Released: 01/22/2008 Document Revised: 06/05/2017 Document Reviewed: 06/05/2017 Elsevier Interactive Patient Education  2019 [employer].

## 2018-05-06 LAB — COMPREHENSIVE METABOLIC PANEL
A/G RATIO: 1.7 (ref 1.2–2.2)
ALT: 26 IU/L (ref 0–44)
AST: 22 IU/L (ref 0–40)
Albumin: 4.1 g/dL (ref 3.5–4.8)
Alkaline Phosphatase: 72 IU/L (ref 39–117)
BILIRUBIN TOTAL: 0.5 mg/dL (ref 0.0–1.2)
BUN/Creatinine Ratio: 16 (ref 10–24)
BUN: 15 mg/dL (ref 8–27)
CO2: 23 mmol/L (ref 20–29)
Calcium: 9.2 mg/dL (ref 8.6–10.2)
Chloride: 103 mmol/L (ref 96–106)
Creatinine, Ser: 0.95 mg/dL (ref 0.76–1.27)
GFR calc Af Amer: 91 mL/min/{1.73_m2} (ref >59)
GFR calc non Af Amer: 79 mL/min/{1.73_m2} (ref >59)
Globulin, Total: 2.4 g/dL (ref 1.5–4.5)
Glucose: 103 mg/dL — ABNORMAL HIGH (ref 65–99)
POTASSIUM: 4.2 mmol/L (ref 3.5–5.2)
SODIUM: 138 mmol/L (ref 134–144)
Total Protein: 6.5 g/dL (ref 6.0–8.5)

## 2018-05-20 ENCOUNTER — Other Ambulatory Visit: Payer: Self-pay | Admitting: Cardiology

## 2018-05-31 DIAGNOSIS — J329 Chronic sinusitis, unspecified: Secondary | ICD-10-CM | POA: Diagnosis not present

## 2018-05-31 DIAGNOSIS — J309 Allergic rhinitis, unspecified: Secondary | ICD-10-CM | POA: Diagnosis not present

## 2018-05-31 DIAGNOSIS — E782 Mixed hyperlipidemia: Secondary | ICD-10-CM | POA: Diagnosis not present

## 2018-05-31 DIAGNOSIS — I4891 Unspecified atrial fibrillation: Secondary | ICD-10-CM | POA: Diagnosis not present

## 2018-06-12 ENCOUNTER — Other Ambulatory Visit: Payer: Self-pay | Admitting: Cardiology

## 2018-06-15 ENCOUNTER — Other Ambulatory Visit: Payer: Self-pay | Admitting: *Deleted

## 2018-06-15 MED ORDER — FLECAINIDE ACETATE 50 MG PO TABS: 50.0000 mg | ORAL_TABLET | Freq: Two times a day (BID) | ORAL | 3 refills | Status: DC

## 2018-06-21 DIAGNOSIS — J3489 Other specified disorders of nose and nasal sinuses: Secondary | ICD-10-CM | POA: Diagnosis not present

## 2018-06-21 DIAGNOSIS — R0981 Nasal congestion: Secondary | ICD-10-CM | POA: Diagnosis not present

## 2018-06-21 DIAGNOSIS — J342 Deviated nasal septum: Secondary | ICD-10-CM | POA: Diagnosis not present

## 2018-06-21 DIAGNOSIS — J309 Allergic rhinitis, unspecified: Secondary | ICD-10-CM | POA: Diagnosis not present

## 2018-06-21 DIAGNOSIS — J343 Hypertrophy of nasal turbinates: Secondary | ICD-10-CM | POA: Diagnosis not present

## 2018-06-21 DIAGNOSIS — H6121 Impacted cerumen, right ear: Secondary | ICD-10-CM | POA: Diagnosis not present

## 2018-06-30 DIAGNOSIS — H25813 Combined forms of age-related cataract, bilateral: Secondary | ICD-10-CM | POA: Diagnosis not present

## 2018-06-30 DIAGNOSIS — H40002 Preglaucoma, unspecified, left eye: Secondary | ICD-10-CM | POA: Diagnosis not present

## 2018-07-12 DIAGNOSIS — Z6827 Body mass index (BMI) 27.0-27.9, adult: Secondary | ICD-10-CM | POA: Diagnosis not present

## 2018-07-12 DIAGNOSIS — J329 Chronic sinusitis, unspecified: Secondary | ICD-10-CM | POA: Diagnosis not present

## 2018-07-12 DIAGNOSIS — J3089 Other allergic rhinitis: Secondary | ICD-10-CM | POA: Diagnosis not present

## 2018-08-09 ENCOUNTER — Other Ambulatory Visit: Payer: Self-pay

## 2018-08-09 DIAGNOSIS — I480 Paroxysmal atrial fibrillation: Secondary | ICD-10-CM

## 2018-08-09 MED ORDER — METOPROLOL SUCCINATE ER 50 MG PO TB24: 50.0000 mg | ORAL_TABLET | ORAL | 1 refills | Status: DC

## 2018-08-11 ENCOUNTER — Other Ambulatory Visit: Payer: Self-pay

## 2018-08-11 ENCOUNTER — Encounter: Payer: Self-pay | Admitting: Allergy and Immunology

## 2018-08-11 ENCOUNTER — Ambulatory Visit (INDEPENDENT_AMBULATORY_CARE_PROVIDER_SITE_OTHER): Payer: Medicare Other | Admitting: Allergy and Immunology

## 2018-08-11 VITALS — BP 140/88 | HR 56 | Temp 97.7°F | Resp 17 | Ht 70.5 in | Wt 213.0 lb

## 2018-08-11 DIAGNOSIS — K219 Gastro-esophageal reflux disease without esophagitis: Secondary | ICD-10-CM

## 2018-08-11 DIAGNOSIS — R0981 Nasal congestion: Secondary | ICD-10-CM

## 2018-08-11 DIAGNOSIS — J3089 Other allergic rhinitis: Secondary | ICD-10-CM | POA: Diagnosis not present

## 2018-08-11 MED ORDER — FAMOTIDINE 40 MG PO TABS: ORAL_TABLET | ORAL | 5 refills | Status: DC

## 2018-08-11 NOTE — Patient Instructions (Addendum)
  1.  Allergen avoidance measures  2.  Treat and prevent inflammation:   A. OTC Nasacort - 1 spray each nostril two times per day  3. Treat and prevent reflux:   A. Famotidine 40mg  tablet daily  B. Minimize caffeine and chocolate consumption.  4. Blood - CBC w/diff, area 2 aeroallergen profile  5. Stop montelukast, azelastine nasal spray, flonase nasal spray  6. If needed:   A. Nasal saline  B. OTC antihistamine  7. Return to clinic in 3 weeks or earlier if problem

## 2018-08-11 NOTE — Progress Notes (Signed)
[place] - [place] - [place] - [place] - [place]   Dear Dr. Venetia Maxon,  Thank you for referring Steve Frye to the [facility] on 08/11/2018.   Below is a summation of this patient's evaluation and recommendations.  Thank you for your referral. I will keep you informed about this patient's response to treatment.   If you have any questions please do not hesitate to contact me.   Sincerely,  Jiles Prows, MD Allergy / Immunology [place]   ______________________________________________________________________    NEW PATIENT NOTE  Referring Provider: Street, Sharon Mt, * Primary Provider: Street, Sharon Mt, MD Date of office visit: 08/11/2018    Subjective:   Chief Complaint:  Steve Frye (DOB: Aug 27, 1945) is a 73 y.o. male who presents to the clinic on 08/11/2018 with a chief complaint of Allergic Rhinitis  and Allergy Testing .     HPI: Steve Frye presents to this clinic in evaluation of allergies.  He has a long history of seasonal allergic rhinitis that changed its presentation about 2 years ago.  At that point in time he developed persistent nasal congestion along with occasional sneezing and clear rhinorrhea.  There was no significant environmental change that occurred at that point in time or any type of medication change that occurred at that point in time that may account for his persistent nasal congestion and other symptoms.  He has been treated with Flonase and nasal antihistamine and a leukotriene modifier and an oral antihistamine which has not resulted in good control of the symptoms.  He has also been treated with several systemic steroids including the administration of a systemic steroid this year.  He does not have any associated anosmia or headaches or ugly nasal discharge and he can sleep through the night without significant nasal disturbance.  He does  have a history of reflux that was more active in the past then it is presently.  About every 2 or 3 weeks he will develop episodes of regurgitation if he eats a "spicy meal".  He is using a probiotic to treat this issue at this point.  Past Medical History:  Diagnosis Date  . Arthritis 01/15/2017  . Atrial fibrillation ([place])   . Chest mass 12/04/2015  . Cutaneous abscess of chest wall 12/04/2015  . Epidermoid cyst of skin 12/04/2015  . GERD (gastroesophageal reflux disease) 01/15/2017  . High risk medication use 01/10/2015   Overview:  Overview:  flecanide  . Hyperlipidemia 01/15/2017  . LAE (left atrial enlargement) 01/15/2017  . Open wound of chest wall 04/01/2016  . PAF (paroxysmal atrial fibrillation) ([place]) 01/10/2015   Overview:  CHADS2 vasc score=1    Past Surgical History:  Procedure Laterality Date  . APPENDECTOMY    . CHOLECYSTECTOMY    . CYST EXCISION    . LAPAROSCOPIC LYSIS INTESTINAL ADHESIONS  2012    Allergies as of 08/11/2018      Reactions   Penicillins Hives      Medication List      aspirin EC 81 MG tablet Take 81 mg daily by mouth.   azelastine 0.1 % nasal spray Commonly known as:  ASTELIN Place 1 spray into both nostrils 2 (two) times daily.   bifidobacterium infantis capsule Take 1 capsule daily by mouth.   fexofenadine 180 MG tablet Commonly known as:  ALLEGRA Take 180 mg by mouth daily.   finasteride 5 MG tablet Commonly known as:  PROSCAR Take 5 mg daily by mouth.   flecainide 50 MG tablet Commonly known as:  TAMBOCOR Take 1 tablet (50 mg total) by mouth 2 (two) times daily.   fluticasone 50 MCG/ACT nasal spray Commonly known as:  FLONASE Place 1 spray 2 (two) times daily into both nostrils.   FRUIT & VEGETABLE DAILY PO Take 2 tablets daily by mouth.   metoprolol succinate 50 MG 24 hr tablet Commonly known as:  TOPROL-XL Take 1 tablet (50 mg total) by mouth every other day.   montelukast 10 MG tablet Commonly known as:  SINGULAIR Take 10  mg by mouth at bedtime.   simvastatin 40 MG tablet Commonly known as:  ZOCOR TAKE 1 TABLET BY MOUTH EVERY DAY       Review of systems negative except as noted in HPI / PMHx or noted below:  Review of Systems  Constitutional: Negative.   HENT: Negative.   Eyes: Negative.   Respiratory: Negative.   Cardiovascular: Negative.   Gastrointestinal: Negative.   Genitourinary: Negative.   Musculoskeletal: Negative.   Skin: Negative.   Neurological: Negative.   Endo/Heme/Allergies: Negative.   Psychiatric/Behavioral: Negative.     Family History  Problem Relation Age of Onset  . Heart disease Father   . Heart failure Father     Social History   Socioeconomic History  . Marital status: Married    Spouse name: Not on file  . Number of children: Not on file  . Years of education: Not on file  . Highest education level: Not on file  Occupational History  . Not on file  Social Needs  . Financial resource strain: Not on file  . Food insecurity:    Worry: Not on file    Inability: Not on file  . Transportation needs:    Medical: Not on file    Non-medical: Not on file  Tobacco Use  . Smoking status: Former Smoker    Years: 20.00    Types: Cigarettes    Last attempt to quit: 01/10/1979    Years since quitting: 39.6  . Smokeless tobacco: Never Used  Substance and Sexual Activity  . Alcohol use: No  . Drug use: No  . Sexual activity: Not on file  Lifestyle  . Physical activity:    Days per week: Not on file    Minutes per session: Not on file  . Stress: Not on file  Relationships  . Social connections:    Talks on phone: Not on file    Gets together: Not on file    Attends religious service: Not on file    Active member of club or organization: Not on file    Attends meetings of clubs or organizations: Not on file    Relationship status: Not on file  . Intimate partner violence:    Fear of current or ex partner: Not on file    Emotionally abused: Not on file     Physically abused: Not on file    Forced sexual activity: Not on file  Other Topics Concern  . Not on file  Social History Narrative  . Not on file    Environmental and Social history  Lives in a house with a dry environment, no animals located inside the household, no carpet in the bedroom, plastic on the bed, plastic on the pillow, and no smoking ongoing with inside the household.  Objective:   Vitals:   08/11/18 0904  BP: 140/88  Pulse: (!) 56  Resp: 17  Temp: 97.7 F (36.5 C)  SpO2: 97%   Height: 5' 10.5" (179.1 cm) Weight: 213 lb (96.6 kg)  Physical Exam Constitutional:      Appearance: He is not diaphoretic.  HENT:     Head: Normocephalic. No right periorbital erythema or left periorbital erythema.     Right Ear: Tympanic membrane, ear canal and external ear normal.     Left Ear: Tympanic membrane, ear canal and external ear normal.     Nose: Mucosal edema present. No rhinorrhea.     Mouth/Throat:     Pharynx: No oropharyngeal exudate.  Eyes:     General: Lids are normal.     Conjunctiva/sclera: Conjunctivae normal.     Pupils: Pupils are equal, round, and reactive to light.  Neck:     Thyroid: No thyromegaly.     Trachea: Trachea normal. No tracheal deviation.  Cardiovascular:     Rate and Rhythm: Normal rate and regular rhythm.     Heart sounds: Normal heart sounds, S1 normal and S2 normal. No murmur.  Pulmonary:     Effort: Pulmonary effort is normal. No respiratory distress.     Breath sounds: No stridor. No wheezing or rales.  Chest:     Chest wall: No tenderness.  Abdominal:     General: There is no distension.     Palpations: Abdomen is soft. There is no mass.     Tenderness: There is no abdominal tenderness. There is no guarding or rebound.  Musculoskeletal:        General: No tenderness.  Lymphadenopathy:     Head:     Right side of head: No tonsillar adenopathy.     Left side of head: No tonsillar adenopathy.     Cervical: No cervical  adenopathy.  Skin:    Coloration: Skin is not pale.     Findings: No erythema or rash.     Nails: There is no clubbing.   Neurological:     Mental Status: He is alert.     Diagnostics: Allergy skin tests were performed.  He demonstrated hypersensitivity to trees and molds.  Assessment and Plan:    1. Perennial allergic rhinitis   2. Chronic nasal congestion   3. Gastroesophageal reflux disease, esophagitis presence not specified     1.  Allergen avoidance measures  2.  Treat and prevent inflammation:   A. OTC Nasacort - 1 spray each nostril two times per day  3. Treat and prevent reflux:   A. Famotidine 40mg  tablet daily  B. Minimize caffeine and chocolate consumption.  4. Blood - CBC w/diff, area 2 aeroallergen profile  5. Stop montelukast, azelastine nasal spray, flonase nasal spray  6. If needed:   A. Nasal saline  B. OTC antihistamine  7. Return to clinic in 3 weeks or earlier if problem  Syris has an atopic component to some of his airway issue but I think there is other matters ongoing as well.  He does have reflux and he may be having some degree of upper airway irritation from acid exposure and we will treat him for reflux as noted above.  He may be having a iatrogenic irritant effect of his upper airway with the use of alcohol contained within [facility] and will eliminate that medication.  We will screen his blood for aero allergen hypersensitivity and make sure that his congestive issue is not tied up with mucosal engorgement sometimes found with polycythemia.  I will see him back in  this clinic in 3 weeks to make a determination about further evaluation and treatment pending his response to the therapy noted above and the results of his diagnostic testing.  Jiles Prows, MD Allergy / Immunology [facility]

## 2018-08-12 ENCOUNTER — Encounter: Payer: Self-pay | Admitting: Allergy and Immunology

## 2018-08-13 LAB — CBC WITH DIFFERENTIAL
Basophils Absolute: 0 10*3/uL (ref 0.0–0.2)
Basos: 0 %
EOS (ABSOLUTE): 0.4 10*3/uL (ref 0.0–0.4)
Eos: 4 %
Hematocrit: 44.5 % (ref 37.5–51.0)
Hemoglobin: 15.5 g/dL (ref 13.0–17.7)
Lymphocytes Absolute: 2.7 10*3/uL (ref 0.7–3.1)
Lymphs: 30 %
MCH: 30.8 pg (ref 26.6–33.0)
MCHC: 34.8 g/dL (ref 31.5–35.7)
MCV: 89 fL (ref 79–97)
Monocytes Absolute: 0.6 10*3/uL (ref 0.1–0.9)
Monocytes: 7 %
Neutrophils Absolute: 5.3 10*3/uL (ref 1.4–7.0)
Neutrophils: 59 %
RBC: 5.03 x10E6/uL (ref 4.14–5.80)
RDW: 13.4 % (ref 11.6–15.4)
WBC: 9 10*3/uL (ref 3.4–10.8)

## 2018-08-13 LAB — ALLERGENS W/TOTAL IGE AREA 2

## 2018-09-01 ENCOUNTER — Ambulatory Visit (INDEPENDENT_AMBULATORY_CARE_PROVIDER_SITE_OTHER): Payer: Medicare Other | Admitting: Allergy and Immunology

## 2018-09-01 ENCOUNTER — Other Ambulatory Visit: Payer: Self-pay

## 2018-09-01 ENCOUNTER — Encounter: Payer: Self-pay | Admitting: Allergy and Immunology

## 2018-09-01 VITALS — BP 158/90 | HR 60 | Resp 16

## 2018-09-01 DIAGNOSIS — J3089 Other allergic rhinitis: Secondary | ICD-10-CM | POA: Diagnosis not present

## 2018-09-01 DIAGNOSIS — R0981 Nasal congestion: Secondary | ICD-10-CM

## 2018-09-01 DIAGNOSIS — K219 Gastro-esophageal reflux disease without esophagitis: Secondary | ICD-10-CM | POA: Diagnosis not present

## 2018-09-01 NOTE — Progress Notes (Signed)
[place] - [place] - [place]   Follow-up Note  Referring Provider: Street, Sharon Mt, * Primary Provider: Street, Sharon Mt, MD Date of Office Visit: 09/01/2018  Subjective:   Steve Frye (DOB: Jul 20, 1945) is a 73 y.o. male who returns to the [facility] on 09/01/2018 in re-evaluation of the following:  HPI: [name] returns to this clinic in reevaluation of rhinitis and possible reflux induced respiratory disease.  His last evaluation was 11 August 2018 at which point time we attempted to address each issue.  Delfino is much better at this point in time.  His major complaint was persistent nasal congestion and rhinorrhea especially upon awakening in the morning.  At this point he has very little nasal congestion or rhinorrhea while utilizing a therapy of a nasal steroid other than Flonase and addressing issues with reflux by using a H2 receptor blocker and consolidating his caffeine by 50%.  Allergies as of 09/01/2018      Reactions   Penicillins Hives      Medication List      aspirin EC 81 MG tablet Take 81 mg daily by mouth.   bifidobacterium infantis capsule Take 1 capsule daily by mouth.   famotidine 40 MG tablet Commonly known as:  PEPCID Take 1 tablet by mouth once daily   finasteride 5 MG tablet Commonly known as:  PROSCAR Take 5 mg daily by mouth.   flecainide 50 MG tablet Commonly known as:  TAMBOCOR Take 1 tablet (50 mg total) by mouth 2 (two) times daily.   FRUIT & VEGETABLE DAILY PO Take 2 tablets daily by mouth.   metoprolol succinate 50 MG 24 hr tablet Commonly known as:  TOPROL-XL Take 1 tablet (50 mg total) by mouth every other day.   Nasacort Allergy 24HR 55 MCG/ACT Aero nasal inhaler Generic drug:  triamcinolone Place 2 sprays into the nose daily.   simvastatin 40 MG tablet Commonly known as:  ZOCOR TAKE 1 TABLET BY MOUTH EVERY DAY       Past Medical History:  Diagnosis Date  . Arthritis  01/15/2017  . Atrial fibrillation ([place])   . Chest mass 12/04/2015  . Cutaneous abscess of chest wall 12/04/2015  . Epidermoid cyst of skin 12/04/2015  . GERD (gastroesophageal reflux disease) 01/15/2017  . High risk medication use 01/10/2015   Overview:  Overview:  flecanide  . Hyperlipidemia 01/15/2017  . LAE (left atrial enlargement) 01/15/2017  . Open wound of chest wall 04/01/2016  . PAF (paroxysmal atrial fibrillation) ([place]) 01/10/2015   Overview:  CHADS2 vasc score=1    Past Surgical History:  Procedure Laterality Date  . APPENDECTOMY    . CHOLECYSTECTOMY    . CYST EXCISION    . LAPAROSCOPIC LYSIS INTESTINAL ADHESIONS  2012    Review of systems negative except as noted in HPI / PMHx or noted below:  Review of Systems  Constitutional: Negative.   HENT: Negative.   Eyes: Negative.   Respiratory: Negative.   Cardiovascular: Negative.   Gastrointestinal: Negative.   Genitourinary: Negative.   Musculoskeletal: Negative.   Skin: Negative.   Neurological: Negative.   Endo/Heme/Allergies: Negative.   Psychiatric/Behavioral: Negative.      Objective:   Vitals:   09/01/18 0905  BP: (!) 158/90  Pulse: 60  Resp: 16  SpO2: 98%          Physical Exam Constitutional:      Appearance: He is not diaphoretic.  HENT:  Head: Normocephalic.     Right Ear: Tympanic membrane, ear canal and external ear normal.     Left Ear: Tympanic membrane, ear canal and external ear normal.     Nose: Nose normal. No mucosal edema or rhinorrhea.     Mouth/Throat:     Pharynx: Uvula midline. No oropharyngeal exudate.  Eyes:     Conjunctiva/sclera: Conjunctivae normal.  Neck:     Thyroid: No thyromegaly.     Trachea: Trachea normal. No tracheal tenderness or tracheal deviation.  Cardiovascular:     Rate and Rhythm: Normal rate and regular rhythm.     Heart sounds: Normal heart sounds, S1 normal and S2 normal. No murmur.  Pulmonary:     Effort: No respiratory distress.     Breath sounds:  Normal breath sounds. No stridor. No wheezing or rales.  Lymphadenopathy:     Head:     Right side of head: No tonsillar adenopathy.     Left side of head: No tonsillar adenopathy.     Cervical: No cervical adenopathy.  Skin:    Findings: No erythema or rash.     Nails: There is no clubbing.   Neurological:     Mental Status: He is alert.     Diagnostics:    Results of blood tests obtained 11 August 2018 identified WBC 9.0, absolute eosinophil 400, absolute lymphocyte 2700, hemoglobin 15.5 with MCV 89, IgE 70 7U/mL, no IgE directed against a screening panel of aeroallergens,  Assessment and Plan:   1. Perennial allergic rhinitis   2. Chronic nasal congestion   3. Gastroesophageal reflux disease, esophagitis presence not specified     1.  Continue to Treat and prevent inflammation:   A. OTC Nasacort - 1 spray each nostril two times per day  2. Continue to Treat and prevent reflux:   A. Famotidine 40mg  tablet daily  B. Minimize caffeine and chocolate consumption.  3. If needed:   A. Nasal saline  B. OTC antihistamine  4. Return to clinic in 12 weeks or earlier if problem  [name] appears to be doing very well on his current plan which is a big improvement given his treatment failure with previous medical therapy.  He will remain on the therapy noted above and I will see him back in this clinic in 12 weeks and maybe there will be an opportunity to consolidate his treatment at that point.  Allena Katz, MD Allergy / Immunology [place]

## 2018-09-01 NOTE — Patient Instructions (Addendum)
  1.  Continue to Treat and prevent inflammation:   A. OTC Nasacort - 1 spray each nostril two times per day  2. Continue to Treat and prevent reflux:   A. Famotidine 40mg  tablet daily  B. Minimize caffeine and chocolate consumption.  3. If needed:   A. Nasal saline  B. OTC antihistamine  4. Return to clinic in 12 weeks or earlier if problem

## 2018-09-02 ENCOUNTER — Encounter: Payer: Self-pay | Admitting: Allergy and Immunology

## 2018-09-15 DIAGNOSIS — L300 Nummular dermatitis: Secondary | ICD-10-CM | POA: Diagnosis not present

## 2018-09-15 DIAGNOSIS — L853 Xerosis cutis: Secondary | ICD-10-CM | POA: Diagnosis not present

## 2018-09-15 DIAGNOSIS — L299 Pruritus, unspecified: Secondary | ICD-10-CM | POA: Diagnosis not present

## 2018-12-02 ENCOUNTER — Ambulatory Visit (INDEPENDENT_AMBULATORY_CARE_PROVIDER_SITE_OTHER): Payer: Medicare Other | Admitting: Allergy and Immunology

## 2018-12-02 ENCOUNTER — Other Ambulatory Visit: Payer: Self-pay

## 2018-12-02 ENCOUNTER — Encounter: Payer: Self-pay | Admitting: Allergy and Immunology

## 2018-12-02 VITALS — BP 138/90 | HR 67 | Temp 98.4°F | Resp 16

## 2018-12-02 DIAGNOSIS — J3089 Other allergic rhinitis: Secondary | ICD-10-CM

## 2018-12-02 DIAGNOSIS — K219 Gastro-esophageal reflux disease without esophagitis: Secondary | ICD-10-CM

## 2018-12-02 NOTE — Progress Notes (Signed)
[place] - [place] - [place]   Follow-up Note  Referring Provider: Street, Sharon Mt, * Primary Provider: Street, Sharon Mt, MD Date of Office Visit: 12/02/2018  Subjective:   Steve Frye (DOB: [birth year]) is a 73 y.o. male who returns to the [facility] on 12/02/2018 in re-evaluation of the following:  HPI: Steve Frye returns to this clinic in evaluation of rhinitis and a history of reflux induced respiratory disease.  He was last seen in this clinic on 01 Sep 2018.  He has continued to do quite well with his nose and his throat and drainage.  He does have a little congestion in the morning but otherwise feels as though his response to medical therapy has been very good.  Currently he uses a nasal steroid twice a day and continues to use famotidine.  He is careful about caffeine use although still continues to drink caffeine 1 time per day.  Allergies as of 12/02/2018      Reactions   Penicillins Hives      Medication List      aspirin EC 81 MG tablet Take 81 mg daily by mouth.   bifidobacterium infantis capsule Take 1 capsule daily by mouth.   famotidine 40 MG tablet Commonly known as: PEPCID Take 1 tablet by mouth once daily   finasteride 5 MG tablet Commonly known as: PROSCAR Take 5 mg daily by mouth.   flecainide 50 MG tablet Commonly known as: TAMBOCOR Take 1 tablet (50 mg total) by mouth 2 (two) times daily.   FRUIT & VEGETABLE DAILY PO Take 2 tablets daily by mouth.   metoprolol succinate 50 MG 24 hr tablet Commonly known as: TOPROL-XL Take 1 tablet (50 mg total) by mouth every other day.   Nasacort Allergy 24HR 55 MCG/ACT Aero nasal inhaler Generic drug: triamcinolone Place 2 sprays into the nose daily.   simvastatin 40 MG tablet Commonly known as: ZOCOR TAKE 1 TABLET BY MOUTH EVERY DAY       Past Medical History:  Diagnosis Date  . Arthritis 01/15/2017  . Atrial fibrillation ([place])   . Chest mass  12/04/2015  . Cutaneous abscess of chest wall 12/04/2015  . Epidermoid cyst of skin 12/04/2015  . GERD (gastroesophageal reflux disease) 01/15/2017  . High risk medication use 01/10/2015   Overview:  Overview:  flecanide  . Hyperlipidemia 01/15/2017  . LAE (left atrial enlargement) 01/15/2017  . Open wound of chest wall 04/01/2016  . PAF (paroxysmal atrial fibrillation) ([place]) 01/10/2015   Overview:  CHADS2 vasc score=1    Past Surgical History:  Procedure Laterality Date  . APPENDECTOMY    . CHOLECYSTECTOMY    . CYST EXCISION    . LAPAROSCOPIC LYSIS INTESTINAL ADHESIONS  2012    Review of systems negative except as noted in HPI / PMHx or noted below:  Review of Systems  Constitutional: Negative.   HENT: Negative.   Eyes: Negative.   Respiratory: Negative.   Cardiovascular: Negative.   Gastrointestinal: Negative.   Genitourinary: Negative.   Musculoskeletal: Negative.   Skin: Negative.   Neurological: Negative.   Endo/Heme/Allergies: Negative.   Psychiatric/Behavioral: Negative.      Objective:   Vitals:   12/02/18 0839  BP: 138/90  Pulse: 67  Resp: 16  Temp: 98.4 F (36.9 C)  SpO2: 97%          Physical Exam Constitutional:      Appearance: He is not diaphoretic.  HENT:  Head: Normocephalic.     Right Ear: Tympanic membrane, ear canal and external ear normal.     Left Ear: Tympanic membrane, ear canal and external ear normal.     Nose: Nose normal. No mucosal edema or rhinorrhea.     Mouth/Throat:     Pharynx: Uvula midline. No oropharyngeal exudate.  Eyes:     Conjunctiva/sclera: Conjunctivae normal.  Neck:     Thyroid: No thyromegaly.     Trachea: Trachea normal. No tracheal tenderness or tracheal deviation.  Cardiovascular:     Rate and Rhythm: Normal rate and regular rhythm.     Heart sounds: Normal heart sounds, S1 normal and S2 normal. No murmur.  Pulmonary:     Effort: No respiratory distress.     Breath sounds: Normal breath sounds. No stridor.  No wheezing or rales.  Lymphadenopathy:     Head:     Right side of head: No tonsillar adenopathy.     Left side of head: No tonsillar adenopathy.     Cervical: No cervical adenopathy.  Skin:    Findings: No erythema or rash.     Nails: There is no clubbing.   Neurological:     Mental Status: He is alert.     Diagnostics: none  Assessment and Plan:   1. Perennial allergic rhinitis   2. Gastroesophageal reflux disease, esophagitis presence not specified     1.  Continue to Treat and prevent inflammation:   A. OTC Nasacort - 1 spray each nostril 1-2 times per day  2. Continue to Treat and prevent reflux:   A. Famotidine 40mg  tablet daily  B. Minimize caffeine and chocolate consumption.  3. If needed:   A. Nasal saline  B. OTC antihistamine  4. Return to clinic in 6 months or earlier if problem  5.  Obtain fall flu vaccine (and COVID vaccine)  Steve Frye appears to be doing quite well on his current therapy which includes anti-inflammatory agents for his airway and therapy directed against reflux and he will continue on this plan and assuming he continues to do well with this plan I will see him back in this clinic in 6 months or earlier if there is a problem.  Allena Katz, MD Allergy / Immunology [place]

## 2018-12-02 NOTE — Patient Instructions (Signed)
  1.  Continue to Treat and prevent inflammation:   A. OTC Nasacort - 1 spray each nostril 1-2 times per day  2. Continue to Treat and prevent reflux:   A. Famotidine 40mg  tablet daily  B. Minimize caffeine and chocolate consumption.  3. If needed:   A. Nasal saline  B. OTC antihistamine  4. Return to clinic in 6 months or earlier if problem  5.  Obtain fall flu vaccine (and COVID vaccine)

## 2018-12-03 DIAGNOSIS — C4442 Squamous cell carcinoma of skin of scalp and neck: Secondary | ICD-10-CM | POA: Diagnosis not present

## 2018-12-03 DIAGNOSIS — B351 Tinea unguium: Secondary | ICD-10-CM | POA: Diagnosis not present

## 2018-12-03 DIAGNOSIS — B353 Tinea pedis: Secondary | ICD-10-CM | POA: Diagnosis not present

## 2018-12-06 ENCOUNTER — Encounter: Payer: Self-pay | Admitting: Allergy and Immunology

## 2018-12-07 ENCOUNTER — Telehealth: Payer: Self-pay | Admitting: *Deleted

## 2018-12-07 NOTE — Telephone Encounter (Signed)
Called patient and advised that since Ranitidine off market no other meds that same but can look over the counter to find it

## 2018-12-07 NOTE — Telephone Encounter (Signed)
Patient states that we prescribed Famotidine but his pharmacy does not have any in stock and is not sure when they will get any in. Patient is wondering if we can substitute with something else. Pharmacy is [facility] on [address]. Patient would like a call back to let him know what we decide.

## 2018-12-28 DIAGNOSIS — C4442 Squamous cell carcinoma of skin of scalp and neck: Secondary | ICD-10-CM | POA: Diagnosis not present

## 2019-02-08 DIAGNOSIS — R233 Spontaneous ecchymoses: Secondary | ICD-10-CM | POA: Diagnosis not present

## 2019-02-08 DIAGNOSIS — L821 Other seborrheic keratosis: Secondary | ICD-10-CM | POA: Diagnosis not present

## 2019-02-08 DIAGNOSIS — D1801 Hemangioma of skin and subcutaneous tissue: Secondary | ICD-10-CM | POA: Diagnosis not present

## 2019-02-08 DIAGNOSIS — Z23 Encounter for immunization: Secondary | ICD-10-CM | POA: Diagnosis not present

## 2019-02-08 DIAGNOSIS — C44319 Basal cell carcinoma of skin of other parts of face: Secondary | ICD-10-CM | POA: Diagnosis not present

## 2019-02-25 ENCOUNTER — Telehealth: Payer: Self-pay | Admitting: Cardiology

## 2019-02-25 NOTE — Telephone Encounter (Signed)
Has questions about stopping asprin

## 2019-02-28 NOTE — Telephone Encounter (Signed)
Patient called back and reports that he is scheduled for mohs surgery (skin cancer removal on his forehead) on 03/09/2019. Patient is wanting to know if he can stop his aspirin 5 days before this procedure. Please advise. Thanks!

## 2019-02-28 NOTE — Telephone Encounter (Signed)
Left message to return call 

## 2019-03-01 ENCOUNTER — Other Ambulatory Visit: Payer: Self-pay | Admitting: Cardiology

## 2019-03-01 NOTE — Telephone Encounter (Signed)
Yes

## 2019-03-01 NOTE — Telephone Encounter (Signed)
Patient advised that he can hold aspirin 5 days prior to Mohs surgery on 03/09/2019.  Patient agreed to plan and verbalized understanding. No further questions.

## 2019-03-09 DIAGNOSIS — D0439 Carcinoma in situ of skin of other parts of face: Secondary | ICD-10-CM | POA: Diagnosis not present

## 2019-03-23 ENCOUNTER — Other Ambulatory Visit: Payer: Self-pay

## 2019-04-16 ENCOUNTER — Other Ambulatory Visit: Payer: Self-pay | Admitting: Allergy and Immunology

## 2019-05-02 NOTE — Progress Notes (Signed)
Cardiology Office Note:    Date:  05/03/2019   ID:  Steve Frye, DOB November 12, 1945, MRN [id number]  PCP:  Street, Sharon Mt, MD  Cardiologist:  Shirlee More, MD    Referring MD: [address], Sharon Mt, *    ASSESSMENT:    1. PAF (paroxysmal atrial fibrillation) ([place])   2. High risk medication use   3. Hyperlipidemia, unspecified hyperlipidemia type   4. Educated about COVID-19 virus infection    PLAN:    In order of problems listed above:  1. Stable no clinical recurrence continue low-dose flecainide beta-blocker aspirin.  I encouraged him to purchase the smart phone adapter and if we have documented atrial fibrillation we need to transition to anticoagulant. 2. Continue flecainide no evidence of toxicity 3. Stable lipids are ideal continue his intermediate intensity statin 4. Courage him to use medical eye protection with a high evidence of Covid in our community   Next appointment: 1year   Medication Adjustments/Labs and Tests Ordered: Current medicines are reviewed at length with the patient today.  Concerns regarding medicines are outlined above.  No orders of the defined types were placed in this encounter.  No orders of the defined types were placed in this encounter.   Chief Complaint  Patient presents with  . Follow-up    He is on flecainide with  . Atrial Fibrillation    History of Present Illness:    Steve Frye is a 74 y.o. male with a hx of PAF CHADSvasc 2 of 1 on flecanide last seen 05/05/2018. Compliance with diet, lifestyle and medications: Yes  He is unaware of any recurrence of atrial fibrillation.  He has good healthcare knowledge and after discussion we will by the smart phone adapter and screen his heart rhythm 3 times a week at home if having atrial fibrillation he need anticoagulation.  He has had no TIA chest pain palpitation or syncope.  He has been seen by allergy for nasal symptoms ask if he can use Singulair and I do not see a contra  indication.  Recent labs performed at his primary care office are exemplary he takes a statin his lipids are at target cholesterol 146 HDL 39 LDL 64 A1c 5.5%.  He has had no side effects from flecainide visual changes headache and has no GI symptoms from aspirin. Past Medical History:  Diagnosis Date  . Arthritis 01/15/2017  . Atrial fibrillation ([place])   . Chest mass 12/04/2015  . Cutaneous abscess of chest wall 12/04/2015  . Epidermoid cyst of skin 12/04/2015  . GERD (gastroesophageal reflux disease) 01/15/2017  . High risk medication use 01/10/2015   Overview:  Overview:  flecanide  . Hyperlipidemia 01/15/2017  . LAE (left atrial enlargement) 01/15/2017  . Open wound of chest wall 04/01/2016  . PAF (paroxysmal atrial fibrillation) ([place]) 01/10/2015   Overview:  CHADS2 vasc score=1    Past Surgical History:  Procedure Laterality Date  . APPENDECTOMY    . CHOLECYSTECTOMY    . CYST EXCISION    . LAPAROSCOPIC LYSIS INTESTINAL ADHESIONS  2012    Current Medications: Current Meds  Medication Sig  . aspirin EC 81 MG tablet Take 81 mg daily by mouth.  . bifidobacterium infantis (ALIGN) capsule Take 1 capsule daily by mouth.  . famotidine (PEPCID) 40 MG tablet TAKE 1 TABLET BY MOUTH EVERY DAY  . finasteride (PROSCAR) 5 MG tablet Take 5 mg daily by mouth.  . flecainide (TAMBOCOR) 50 MG tablet Take 1 tablet (50 mg total) by mouth  2 (two) times daily.  . Loratadine (CLARITIN PO) Take by mouth.  . metoprolol succinate (TOPROL-XL) 50 MG 24 hr tablet Take 1 tablet (50 mg total) by mouth every other day.  . Nutritional Supplements (FRUIT & VEGETABLE DAILY PO) Take 2 tablets daily by mouth.  . simvastatin (ZOCOR) 40 MG tablet TAKE 1 TABLET BY MOUTH EVERY DAY  . triamcinolone (NASACORT ALLERGY 24HR) 55 MCG/ACT AERO nasal inhaler Place 2 sprays into the nose daily.     Allergies:   Penicillins   Social History   Socioeconomic History  . Marital status: Married    Spouse name: Not on file  . Number  of children: Not on file  . Years of education: Not on file  . Highest education level: Not on file  Occupational History  . Not on file  Tobacco Use  . Smoking status: Former Smoker    Years: 20.00    Types: Cigarettes    Quit date: 01/10/1979    Years since quitting: 40.3  . Smokeless tobacco: Never Used  Substance and Sexual Activity  . Alcohol use: No  . Drug use: No  . Sexual activity: Not on file  Other Topics Concern  . Not on file  Social History Narrative  . Not on file   Social Determinants of Health   Financial Resource Strain:   . Difficulty of Paying Living Expenses: Not on file  Food Insecurity:   . Worried About Charity fundraiser in the Last Year: Not on file  . Ran Out of Food in the Last Year: Not on file  Transportation Needs:   . Lack of Transportation (Medical): Not on file  . Lack of Transportation (Non-Medical): Not on file  Physical Activity:   . Days of Exercise per Week: Not on file  . Minutes of Exercise per Session: Not on file  Stress:   . Feeling of Stress : Not on file  Social Connections:   . Frequency of Communication with Friends and Family: Not on file  . Frequency of Social Gatherings with Friends and Family: Not on file  . Attends Religious Services: Not on file  . Active Member of Clubs or Organizations: Not on file  . Attends Archivist Meetings: Not on file  . Marital Status: Not on file     Family History: The patient's family history includes Heart disease in his father; Heart failure in his father. ROS:   Please see the history of present illness.    All other systems reviewed and are negative.  EKGs/Labs/Other Studies Reviewed:    The following studies were reviewed today:  EKG:  EKG ordered today and personally reviewed.  The ekg ordered today demonstrates sinus rhythm EKG is normal QRS duration is 104 ms no evidence of toxicity  Recent Labs: 05/05/2018: ALT 26; BUN 15; Creatinine, Ser 0.95; Potassium 4.2;  Sodium 138 08/11/2018: Hemoglobin 15.5  Recent Lipid Panel    Component Value Date/Time   CHOL 150 03/12/2017 1034   TRIG 122 03/12/2017 1034   HDL 40 03/12/2017 1034   CHOLHDL 3.8 03/12/2017 1034   LDLCALC 86 03/12/2017 1034    Physical Exam:    VS:  BP 134/72 (BP Location: Left Arm, Patient Position: Sitting, Cuff Size: Large)   Pulse 60   Ht 6' (1.829 m)   Wt 219 lb (99.3 kg)   SpO2 96%   BMI 29.70 kg/m     Wt Readings from Last 3 Encounters:  05/03/19 219  lb (99.3 kg)  08/11/18 213 lb (96.6 kg)  05/05/18 217 lb 2 oz (98.5 kg)     GEN:  Well nourished, well developed in no acute distress HEENT: Normal NECK: No JVD; No carotid bruits LYMPHATICS: No lymphadenopathy CARDIAC: RRR, no murmurs, rubs, gallops RESPIRATORY:  Clear to auscultation without rales, wheezing or rhonchi  ABDOMEN: Soft, non-tender, non-distended MUSCULOSKELETAL:  No edema; No deformity  SKIN: Warm and dry NEUROLOGIC:  Alert and oriented x 3 PSYCHIATRIC:  Normal affect    Signed, Shirlee More, MD  05/03/2019 10:14 AM    [place]

## 2019-05-03 ENCOUNTER — Ambulatory Visit (INDEPENDENT_AMBULATORY_CARE_PROVIDER_SITE_OTHER): Payer: Medicare Other | Admitting: Cardiology

## 2019-05-03 ENCOUNTER — Other Ambulatory Visit: Payer: Self-pay

## 2019-05-03 ENCOUNTER — Encounter: Payer: Self-pay | Admitting: Cardiology

## 2019-05-03 VITALS — BP 134/72 | HR 60 | Ht 72.0 in | Wt 219.0 lb

## 2019-05-03 DIAGNOSIS — E785 Hyperlipidemia, unspecified: Secondary | ICD-10-CM | POA: Diagnosis not present

## 2019-05-03 DIAGNOSIS — Z79899 Other long term (current) drug therapy: Secondary | ICD-10-CM

## 2019-05-03 DIAGNOSIS — I480 Paroxysmal atrial fibrillation: Secondary | ICD-10-CM

## 2019-05-03 DIAGNOSIS — Z7189 Other specified counseling: Secondary | ICD-10-CM

## 2019-05-03 DIAGNOSIS — Z5181 Encounter for therapeutic drug level monitoring: Secondary | ICD-10-CM

## 2019-05-03 MED ORDER — FLECAINIDE ACETATE 50 MG PO TABS: 50.0000 mg | ORAL_TABLET | Freq: Two times a day (BID) | ORAL | 3 refills | Status: DC

## 2019-05-03 NOTE — Patient Instructions (Addendum)
Medication Instructions:  Your physician recommends that you continue on your current medications as directed. Please refer to the Current Medication list given to you today.  *If you need a refill on your cardiac medications before your next appointment, please call your pharmacy*  Lab Work: None ordered   If you have labs (blood work) drawn today and your tests are completely normal, you will receive your results only by: Marland Kitchen MyChart Message (if you have MyChart) OR . A paper copy in the mail If you have any lab test that is abnormal or we need to change your treatment, we will call you to review the results.  Testing/Procedures: None ordered   Follow-Up: At [facility], you and your health needs are our priority.  As part of our continuing mission to provide you with exceptional heart care, we have created designated Provider Care Teams.  These Care Teams include your primary Cardiologist (physician) and Advanced Practice Providers (APPs -  Physician Assistants and Nurse Practitioners) who all work together to provide you with the care you need, when you need it.  Your next appointment:   12 month(s)  The format for your next appointment:   In Person  Provider:   Shirlee More, MD  Other Instructions [email or web address] [email or web address]        FDA-cleared, clinical grade mobile EKG monitor: Jodelle Red is the most clinically-validated mobile EKG used by the world's leading cardiac care medical professionals With Basic service, know instantly if your heart rhythm is normal or if atrial fibrillation is detected, and email the last single EKG recording to yourself or your doctor Premium service, available for purchase through the Kardia app for $9.99 per month or $99 per year, includes unlimited history and storage of your EKG recordings, a monthly EKG summary report to share with your doctor, along with the ability to track your blood pressure, activity and  weight Includes one KardiaMobile phone clip FREE SHIPPING: Standard delivery 1-3 business days. Orders placed by 11:00am PST will ship that afternoon. Otherwise, will ship next business day. All orders ship via [employer] from [place], [place] on line at [employer] or at [employer] on [address]

## 2019-05-04 ENCOUNTER — Encounter: Payer: Self-pay | Admitting: Allergy and Immunology

## 2019-05-04 ENCOUNTER — Ambulatory Visit (INDEPENDENT_AMBULATORY_CARE_PROVIDER_SITE_OTHER): Payer: Medicare Other | Admitting: Allergy and Immunology

## 2019-05-04 VITALS — BP 148/78 | HR 64 | Temp 97.6°F | Resp 16

## 2019-05-04 DIAGNOSIS — K219 Gastro-esophageal reflux disease without esophagitis: Secondary | ICD-10-CM | POA: Diagnosis not present

## 2019-05-04 DIAGNOSIS — J3089 Other allergic rhinitis: Secondary | ICD-10-CM | POA: Diagnosis not present

## 2019-05-04 DIAGNOSIS — R0981 Nasal congestion: Secondary | ICD-10-CM

## 2019-05-04 NOTE — Progress Notes (Signed)
[facility] [place] [facility] [place]   Follow-up Note  Referring Provider: Street, Sharon Mt, * Primary Provider: Street, Sharon Mt, MD Date of Office Visit: 05/04/2019  Subjective:   Steve Frye (DOB: December 20, 1945) is a 74 y.o. male who returns to the [facility] [place] on 05/04/2019 in re-evaluation of the following:  HPI: Ravion returns to this clinic in evaluation of his persistent rhinitis and a history of reflux induced respiratory disease.  He was last seen in this clinic on 02 December 2018 at which point in time he was doing very well on his therapy.  He has slowly redeveloped significant nasal congestion especially in the evening and the morning when he wakes up.  He does continue to use Nasacort on a consistent basis.  He has been using a "Vicks salve" under his nose on several evenings.  He uses saline all day.  He has not had any anosmia or ugly nasal discharge or headaches.  He continues on his therapy for reflux which is working very well and he has not had any significant issues with his throat.  He did receive the flu vaccine this year.  Allergies as of 05/04/2019      Reactions   Penicillins Hives      Medication List      aspirin EC 81 MG tablet Take 81 mg daily by mouth.   bifidobacterium infantis capsule Take 1 capsule daily by mouth.   CLARITIN PO Take by mouth.   famotidine 40 MG tablet Commonly known as: PEPCID TAKE 1 TABLET BY MOUTH EVERY DAY   finasteride 5 MG tablet Commonly known as: PROSCAR Take 5 mg daily by mouth.   flecainide 50 MG tablet Commonly known as: TAMBOCOR Take 1 tablet (50 mg total) by mouth 2 (two) times daily.   FRUIT & VEGETABLE DAILY PO Take 2 tablets daily by mouth.   metoprolol succinate 50 MG 24 hr tablet Commonly known as: TOPROL-XL Take 1 tablet (50 mg total) by mouth every other day.   Nasacort Allergy 24HR 55 MCG/ACT Aero nasal inhaler Generic drug: triamcinolone Place 2  sprays into the nose daily.   simvastatin 40 MG tablet Commonly known as: ZOCOR TAKE 1 TABLET BY MOUTH EVERY DAY       Past Medical History:  Diagnosis Date  . Arthritis 01/15/2017  . Atrial fibrillation ([place])   . Chest mass 12/04/2015  . Cutaneous abscess of chest wall 12/04/2015  . Epidermoid cyst of skin 12/04/2015  . GERD (gastroesophageal reflux disease) 01/15/2017  . High risk medication use 01/10/2015   Overview:  Overview:  flecanide  . Hyperlipidemia 01/15/2017  . LAE (left atrial enlargement) 01/15/2017  . Open wound of chest wall 04/01/2016  . PAF (paroxysmal atrial fibrillation) ([place]) 01/10/2015   Overview:  CHADS2 vasc score=1    Past Surgical History:  Procedure Laterality Date  . APPENDECTOMY    . CHOLECYSTECTOMY    . CYST EXCISION    . LAPAROSCOPIC LYSIS INTESTINAL ADHESIONS  2012    Review of systems negative except as noted in HPI / PMHx or noted below:  Review of Systems  Constitutional: Negative.   HENT: Negative.   Eyes: Negative.   Respiratory: Negative.   Cardiovascular: Negative.   Gastrointestinal: Negative.   Genitourinary: Negative.   Musculoskeletal: Negative.   Skin: Negative.   Neurological: Negative.   Endo/Heme/Allergies: Negative.   Psychiatric/Behavioral: Negative.      Objective:   Vitals:   05/04/19  0840  BP: (!) 148/78  Pulse: 64  Resp: 16  Temp: 97.6 F (36.4 C)  SpO2: 97%          Physical Exam Constitutional:      Appearance: He is not diaphoretic.  HENT:     Head: Normocephalic.     Right Ear: Tympanic membrane, ear canal and external ear normal.     Left Ear: Tympanic membrane, ear canal and external ear normal.     Nose: Septal deviation and mucosal edema present. No rhinorrhea.     Mouth/Throat:     Pharynx: Uvula midline. No oropharyngeal exudate.  Eyes:     Conjunctiva/sclera: Conjunctivae normal.  Neck:     Thyroid: No thyromegaly.     Trachea: Trachea normal. No tracheal tenderness or tracheal  deviation.  Cardiovascular:     Rate and Rhythm: Normal rate and regular rhythm.     Heart sounds: Normal heart sounds, S1 normal and S2 normal. No murmur.  Pulmonary:     Effort: No respiratory distress.     Breath sounds: Normal breath sounds. No stridor. No wheezing or rales.  Lymphadenopathy:     Head:     Right side of head: No tonsillar adenopathy.     Left side of head: No tonsillar adenopathy.     Cervical: No cervical adenopathy.  Skin:    Findings: No erythema or rash.     Nails: There is no clubbing.  Neurological:     Mental Status: He is alert.     Diagnostics: none  Assessment and Plan:   1. Perennial allergic rhinitis   2. Chronic nasal congestion   3. Gastroesophageal reflux disease, unspecified whether esophagitis present     1.  Utilize the following medications every night:   A.  Afrin-1 spray single nostril.  Alternate nostrils every evening  B.  Nasacort-2 sprays each nostril  2. Continue to Treat and prevent reflux:   A. Famotidine 40mg  tablet daily  B. Minimize caffeine and chocolate consumption.  3. If needed:   A. Nasal saline  B. OTC antihistamine  4.  Consider a course of immunotherapy  5. Return to clinic in 6 months or earlier if problem  6.  Obtain Covid vaccine  Cher will utilize a combination of a nasal decongestant spray and Nasacort.  With the use of Nasacort we should not get in any problem with rebound effect from using his nasal decongestant spray.  We will have him use a very low dose of Afrin alternating his nostrils every other day.  He will continue on therapy for reflux.  If he fails medical therapy he would be a candidate for immunotherapy and we have given him literature on this form of treatment during today's visit.  Allena Katz, MD Allergy / Immunology [place]

## 2019-05-04 NOTE — Patient Instructions (Addendum)
  1.  Utilize the following medications every night:   A.  Afrin-1 spray single nostril.  Alternate nostrils every evening  B.  Nasacort-2 sprays each nostril  2. Continue to Treat and prevent reflux:   A. Famotidine 40mg  tablet daily  B. Minimize caffeine and chocolate consumption.  3. If needed:   A. Nasal saline  B. OTC antihistamine  4.  Consider a course of immunotherapy  5. Return to clinic in 6 months or earlier if problem  6.  Obtain Covid vaccine

## 2019-05-05 ENCOUNTER — Encounter: Payer: Self-pay | Admitting: Allergy and Immunology

## 2019-07-21 ENCOUNTER — Other Ambulatory Visit: Payer: Self-pay | Admitting: Cardiology

## 2019-07-21 DIAGNOSIS — I480 Paroxysmal atrial fibrillation: Secondary | ICD-10-CM

## 2019-08-10 ENCOUNTER — Telehealth: Payer: Self-pay | Admitting: Cardiology

## 2019-08-10 NOTE — Telephone Encounter (Signed)
Called and scheduled [place] at [place] with you at 10:20.  Patient verbalized understanding, gave address and contact if concerns.   Thanks!

## 2019-08-10 NOTE — Telephone Encounter (Signed)
Contacted patient, he states for the past 2 weeks, he has had some SOB when doing any exertion. He states that when he is mowing- or walking long distance this is when it occurs the worse.  Patient denies chest pain, or swelling at this time.  He does have appointment on 4/19- I advised patient to keep this appointment. He is unaware if he is going in and out of AFIB at this time, I did advise patient if he became have severe difficulty breathing or unable to catch his breath, or any chest pains to go to the ER to be evaluated-  But I would send a message to MD to make aware and call back with any other recommendations.

## 2019-08-10 NOTE — Telephone Encounter (Signed)
Thank you, can you offer an appointment tomorrow Friday at [facility] if you come here I am a little concerned he may be in atrial fibrillation and heart failure if he does not want to drive to med center I will see him next week.

## 2019-08-10 NOTE — Telephone Encounter (Signed)
Pt c/o Shortness Of Breath: STAT if SOB developed within the last 24 hours or pt is noticeably SOB on the phone  1. Are you currently SOB (can you hear that pt is SOB on the phone)? no  2. How long have you been experiencing SOB? Last 2 weeks  3. Are you SOB when sitting or when up moving around? Moving around  4. Are you currently experiencing any other symptoms? No  Patient states he has been having SOB for the last 2 week and states he is not having any other symptoms. He has an appointment 08/15/2019.

## 2019-08-11 ENCOUNTER — Other Ambulatory Visit: Payer: Self-pay

## 2019-08-11 ENCOUNTER — Encounter: Payer: Self-pay | Admitting: Cardiology

## 2019-08-11 ENCOUNTER — Ambulatory Visit (INDEPENDENT_AMBULATORY_CARE_PROVIDER_SITE_OTHER): Payer: Medicare Other | Admitting: Cardiology

## 2019-08-11 ENCOUNTER — Ambulatory Visit (INDEPENDENT_AMBULATORY_CARE_PROVIDER_SITE_OTHER): Payer: Medicare Other

## 2019-08-11 VITALS — BP 136/70 | HR 57 | Ht 72.0 in | Wt 220.0 lb

## 2019-08-11 DIAGNOSIS — I480 Paroxysmal atrial fibrillation: Secondary | ICD-10-CM | POA: Diagnosis not present

## 2019-08-11 DIAGNOSIS — R0609 Other forms of dyspnea: Secondary | ICD-10-CM

## 2019-08-11 DIAGNOSIS — E785 Hyperlipidemia, unspecified: Secondary | ICD-10-CM | POA: Diagnosis not present

## 2019-08-11 DIAGNOSIS — Z79899 Other long term (current) drug therapy: Secondary | ICD-10-CM | POA: Diagnosis not present

## 2019-08-11 DIAGNOSIS — R0600 Dyspnea, unspecified: Secondary | ICD-10-CM | POA: Diagnosis not present

## 2019-08-11 DIAGNOSIS — R079 Chest pain, unspecified: Secondary | ICD-10-CM | POA: Diagnosis not present

## 2019-08-11 LAB — TROPONIN T: Troponin T (Highly Sensitive): 7 ng/L (ref 0–22)

## 2019-08-11 LAB — PRO B NATRIURETIC PEPTIDE: NT-Pro BNP: 178 pg/mL (ref 0–376)

## 2019-08-11 NOTE — Patient Instructions (Signed)
Medication Instructions:  Your physician recommends that you continue on your current medications as directed. Please refer to the Current Medication list given to you today.  *If you need a refill on your cardiac medications before your next appointment, please call your pharmacy*   Lab Work: Your physician recommends that you return for lab work in: TODAY STAT Troponin/ProBNP If you have labs (blood work) drawn today and your tests are completely normal, you will receive your results only by: Marland Kitchen MyChart Message (if you have MyChart) OR . A paper copy in the mail If you have any lab test that is abnormal or we need to change your treatment, we will call you to review the results.   Testing/Procedures: Your physician has requested that you have an echocardiogram. Echocardiography is a painless test that uses sound waves to create images of your heart. It provides your doctor with information about the size and shape of your heart and how well your heart's chambers and valves are working. This procedure takes approximately one hour. There are no restrictions for this procedure.  A zio monitor was ordered today. It will remain on for 7 days. You will then return monitor and event diary in provided box. It takes 1-2 weeks for report to be downloaded and returned to [place]. We will call you with the results. If monitor falls off or has orange flashing light, please call Zio for further instructions.     [facility] [address] [place], [place] [address] Phone:  [phone]    Please arrive 15 minutes prior to your appointment time for registration and insurance purposes.  The test will take approximately 3 to 4 hours to complete; you may bring reading material.  If someone comes with you to your appointment, they will need to remain in the main lobby due to limited space in the testing area. **If you are pregnant or breastfeeding, please notify the nuclear lab  prior to your appointment**  How to prepare for your Myocardial Perfusion Test: . Do not eat or drink 3 hours prior to your test, except you may have water. . Do not consume products containing caffeine (regular or decaffeinated) 12 hours prior to your test. (ex: coffee, chocolate, sodas, tea). Do bring a list of your current medications with you.  If not listed below, you may take your medications as normal. . Do not take metoprolol (Lopressor, Toprol) for 24 hours prior to the test.  Bring the medication to your appointment as you may be required to take it once the test is complete. . Do wear comfortable clothes (no dresses or overalls) and walking shoes, tennis shoes preferred (No heels or open toe shoes are allowed). . Do NOT wear cologne, perfume, aftershave, or lotions (deodorant is allowed). . If these instructions are not followed, your test will have to be rescheduled.  Please report to [address] for your test.  If you have questions or concerns about your appointment, you can call the [facility] at [phone].  If you cannot keep your appointment, please provide 24 hours notification to the Nuclear Lab, to avoid a possible $50 charge to your account.      Follow-Up: At [facility], you and your health needs are our priority.  As part of our continuing mission to provide you with exceptional heart care, we have created designated Provider Care Teams.  These Care Teams include your primary Cardiologist (physician) and Advanced Practice  Providers (APPs -  Physician Assistants and Nurse Practitioners) who all work together to provide you with the care you need, when you need it.  We recommend signing up for the patient portal called "MyChart".  Sign up information is provided on this After Visit Summary.  MyChart is used to connect with patients for Virtual Visits (Telemedicine).  Patients are able to view lab/test results, encounter  notes, upcoming appointments, etc.  Non-urgent messages can be sent to your provider as well.   To learn more about what you can do with MyChart, go to [email or web address].    Your next appointment:   2 week(s)  The format for your next appointment:   In Person  Provider:   Shirlee More, MD   Other Instructions

## 2019-08-11 NOTE — Progress Notes (Signed)
Cardiology Office Note:    Date:  08/11/2019   ID:  Steve Frye, DOB 1946-01-09, MRN [id number]  PCP:  Street, Sharon Mt, MD  Cardiologist:  Shirlee More, MD    Referring MD: [address], Sharon Mt, *    ASSESSMENT:    1. Dyspnea on exertion   2. PAF (paroxysmal atrial fibrillation) ([place])   3. High risk medication use   4. Hyperlipidemia, unspecified hyperlipidemia type   5. Chest pain of uncertain etiology    PLAN:    In order of problems listed above:  1. Differential diagnosis anginal equivalent versus recurrent atrial fibrillation versus cardiomyopathy in the setting of atrial fibrillation.  He will have a quick evaluation including a high-sensitivity troponin today along with a proBNP level to screen for heart failure and echocardiogram myocardial perfusion study and in the time being continue his flecainide treatment.  If high-sensitivity troponin is abnormal we will treat his acute coronary syndrome necessitating a shift from outpatient to inpatient evaluation.  Laboratory tests will be phoned to me as a stat result.. 2. Continue his statin lipids are at target   Next appointment: 2 weeks   Medication Adjustments/Labs and Tests Ordered: Current medicines are reviewed at length with the patient today.  Concerns regarding medicines are outlined above.  No orders of the defined types were placed in this encounter.  No orders of the defined types were placed in this encounter.   Chief Complaint  Patient presents with  . Shortness of Breath    History of Present Illness:    Steve Frye is a 74 y.o. male with a hx of  PAF CHADSvasc 2 of 1 on flecanide last seen by me 05/03/2019 in sinus rhythm..  He is being seen today at his request having called our office yesterday complaints of shortness of breath Compliance with diet, lifestyle and medications: Yes  In general he is done very well.  He has had 2 episodes in the last week with physical effort he develops severe  limiting shortness of breath and caused him to stop and rest pain he recovered.  He wondered if he is back in atrial fibrillation he had no palpitation with that the episode was brief he has had no fever cough or wheezing and has had no orthopnea or edema.  He has had remote CAD evaluations and echocardiogram probably done in the range of 10 years that were normal.  He is not having typical angina.  For further evaluation we will check a high-sensitivity troponin and if elevated treat him as acute coronary syndrome.  He will quickly have a myocardial perfusion study and echocardiogram and will apply a 7-day ZIO to see if he has recurrent atrial fibrillation along all this done quickly because of his flecainide therapy he is in sinus rhythm today and his EKG is normal. Past Medical History:  Diagnosis Date  . Arthritis 01/15/2017  . Atrial fibrillation ([place])   . Chest mass 12/04/2015  . Cutaneous abscess of chest wall 12/04/2015  . Epidermoid cyst of skin 12/04/2015  . GERD (gastroesophageal reflux disease) 01/15/2017  . High risk medication use 01/10/2015   Overview:  Overview:  flecanide  . Hyperlipidemia 01/15/2017  . LAE (left atrial enlargement) 01/15/2017  . Open wound of chest wall 04/01/2016  . PAF (paroxysmal atrial fibrillation) ([place]) 01/10/2015   Overview:  CHADS2 vasc score=1    Past Surgical History:  Procedure Laterality Date  . APPENDECTOMY    . CHOLECYSTECTOMY    . CYST  EXCISION    . LAPAROSCOPIC LYSIS INTESTINAL ADHESIONS  2012    Current Medications: Current Meds  Medication Sig  . aspirin EC 81 MG tablet Take 81 mg daily by mouth.  . bifidobacterium infantis (ALIGN) capsule Take 1 capsule daily by mouth.  . famotidine (PEPCID) 40 MG tablet TAKE 1 TABLET BY MOUTH EVERY DAY  . finasteride (PROSCAR) 5 MG tablet Take 5 mg daily by mouth.  . flecainide (TAMBOCOR) 50 MG tablet Take 1 tablet (50 mg total) by mouth 2 (two) times daily.  . Loratadine (CLARITIN PO) Take by mouth.  .  metoprolol succinate (TOPROL-XL) 50 MG 24 hr tablet TAKE 1 TABLET (50 MG TOTAL) BY MOUTH EVERY OTHER DAY.  Marland Kitchen Nutritional Supplements (FRUIT & VEGETABLE DAILY PO) Take 2 tablets daily by mouth.  . simvastatin (ZOCOR) 40 MG tablet TAKE 1 TABLET BY MOUTH EVERY DAY  . triamcinolone (NASACORT ALLERGY 24HR) 55 MCG/ACT AERO nasal inhaler Place 2 sprays into the nose daily.     Allergies:   Penicillins   Social History   Socioeconomic History  . Marital status: Married    Spouse name: Not on file  . Number of children: Not on file  . Years of education: Not on file  . Highest education level: Not on file  Occupational History  . Not on file  Tobacco Use  . Smoking status: Former Smoker    Years: 20.00    Types: Cigarettes    Quit date: 01/10/1979    Years since quitting: 40.6  . Smokeless tobacco: Never Used  Substance and Sexual Activity  . Alcohol use: No  . Drug use: No  . Sexual activity: Not on file  Other Topics Concern  . Not on file  Social History Narrative  . Not on file   Social Determinants of Health   Financial Resource Strain:   . Difficulty of Paying Living Expenses:   Food Insecurity:   . Worried About Charity fundraiser in the Last Year:   . Arboriculturist in the Last Year:   Transportation Needs:   . Film/video editor (Medical):   Marland Kitchen Lack of Transportation (Non-Medical):   Physical Activity:   . Days of Exercise per Week:   . Minutes of Exercise per Session:   Stress:   . Feeling of Stress :   Social Connections:   . Frequency of Communication with Friends and Family:   . Frequency of Social Gatherings with Friends and Family:   . Attends Religious Services:   . Active Member of Clubs or Organizations:   . Attends Archivist Meetings:   Marland Kitchen Marital Status:      Family History: The patient's family history includes Heart disease in his father; Heart failure in his father. ROS:   Please see the history of present illness.    All other  systems reviewed and are negative.  EKGs/Labs/Other Studies Reviewed:    The following studies were reviewed today:  EKG:  EKG ordered today and personally reviewed.  The ekg ordered today demonstrates sinus rhythm normal EKG  Recent Labs:  04/13/2019: Cholesterol 146 LDL at target 64 HDL 39 A1c is normal at 5.5% Creatinine normal 0.90. 08/11/2018: Hemoglobin 15.5  Recent Lipid Panel    Component Value Date/Time   CHOL 150 03/12/2017 1034   TRIG 122 03/12/2017 1034   HDL 40 03/12/2017 1034   CHOLHDL 3.8 03/12/2017 1034   LDLCALC 86 03/12/2017 1034    Physical Exam:  VS:  BP 136/70   Pulse (!) 57   Ht 6' (1.829 m)   Wt 220 lb (99.8 kg)   SpO2 98%   BMI 29.84 kg/m     Wt Readings from Last 3 Encounters:  08/11/19 220 lb (99.8 kg)  05/03/19 219 lb (99.3 kg)  08/11/18 213 lb (96.6 kg)     GEN: He does not look acutely ill well nourished, well developed in no acute distress HEENT: Normal NECK: No JVD; No carotid bruits LYMPHATICS: No lymphadenopathy CARDIAC: RRR, no murmurs, rubs, gallops RESPIRATORY:  Clear to auscultation without rales, wheezing or rhonchi  ABDOMEN: Soft, non-tender, non-distended MUSCULOSKELETAL:  No edema; No deformity  SKIN: Warm and dry NEUROLOGIC:  Alert and oriented x 3 PSYCHIATRIC:  Normal affect    Signed, Shirlee More, MD  08/11/2019 10:33 AM    [place]

## 2019-08-15 ENCOUNTER — Telehealth: Payer: Self-pay

## 2019-08-15 ENCOUNTER — Ambulatory Visit (INDEPENDENT_AMBULATORY_CARE_PROVIDER_SITE_OTHER): Payer: Medicare Other

## 2019-08-15 ENCOUNTER — Other Ambulatory Visit: Payer: Self-pay

## 2019-08-15 ENCOUNTER — Ambulatory Visit: Payer: Medicare Other | Admitting: Cardiology

## 2019-08-15 DIAGNOSIS — R0600 Dyspnea, unspecified: Secondary | ICD-10-CM

## 2019-08-15 NOTE — Telephone Encounter (Signed)
Left a detailed message on patients home phone number.

## 2019-08-15 NOTE — Telephone Encounter (Signed)
-----   Message from Richardo Priest, MD sent at 08/15/2019 12:32 PM EDT ----- Normal or stable result  This is normal good he is pending stress test

## 2019-08-15 NOTE — Progress Notes (Signed)
2D Echocardiogram has been performed. 11:43am 08/15/19 Raquel Sarna Deem Marmol RDCS

## 2019-08-17 ENCOUNTER — Encounter: Payer: Self-pay | Admitting: *Deleted

## 2019-08-17 ENCOUNTER — Telehealth: Payer: Self-pay | Admitting: *Deleted

## 2019-08-17 NOTE — Telephone Encounter (Signed)
Patient given detailed instructions per Myocardial Perfusion Study Information Sheet for the test on 08/23/2019 at 0745. Patient notified to arrive 15 minutes early and that it is imperative to arrive on time for appointment to keep from having the test rescheduled.  If you need to cancel or reschedule your appointment, please call the office within 24 hours of your appointment. . Patient verbalized understanding.Yulieth Carrender, Lucius Conn letter sent with instructions

## 2019-08-23 ENCOUNTER — Ambulatory Visit (INDEPENDENT_AMBULATORY_CARE_PROVIDER_SITE_OTHER): Payer: Medicare Other

## 2019-08-23 ENCOUNTER — Other Ambulatory Visit: Payer: Self-pay

## 2019-08-23 VITALS — Ht 72.0 in | Wt 220.0 lb

## 2019-08-23 DIAGNOSIS — R0600 Dyspnea, unspecified: Secondary | ICD-10-CM | POA: Diagnosis not present

## 2019-08-23 DIAGNOSIS — I480 Paroxysmal atrial fibrillation: Secondary | ICD-10-CM | POA: Diagnosis not present

## 2019-08-23 DIAGNOSIS — R0609 Other forms of dyspnea: Secondary | ICD-10-CM

## 2019-08-23 LAB — MYOCARDIAL PERFUSION IMAGING
LV dias vol: 99 mL (ref 62–150)
LV sys vol: 44 mL
Peak HR: 76 {beats}/min
Rest HR: 52 {beats}/min
SDS: 6
SRS: 1
SSS: 7
TID: 1.17

## 2019-08-23 MED ORDER — TECHNETIUM TC 99M TETROFOSMIN IV KIT
32.1000 | PACK | Freq: Once | INTRAVENOUS | Status: AC | PRN
  Administered 2019-08-23: 32.1 via INTRAVENOUS

## 2019-08-23 MED ORDER — REGADENOSON 0.4 MG/5ML IV SOLN
0.4000 mg | Freq: Once | INTRAVENOUS | Status: AC
  Administered 2019-08-23: 0.4 mg via INTRAVENOUS

## 2019-08-23 MED ORDER — TECHNETIUM TC 99M TETROFOSMIN IV KIT
10.5000 | PACK | Freq: Once | INTRAVENOUS | Status: AC | PRN
  Administered 2019-08-23: 10.5 via INTRAVENOUS

## 2019-08-29 ENCOUNTER — Other Ambulatory Visit: Payer: Self-pay

## 2019-08-29 NOTE — Progress Notes (Signed)
Cardiology Office Note:    Date:  08/30/2019   ID:  Steve Frye, DOB 02-Aug-1945, MRN [id number]  PCP:  Street, Sharon Mt, MD  Cardiologist:  Shirlee More, MD    Referring MD: [address], Sharon Mt, *    ASSESSMENT:    1. Abnormal myocardial perfusion study   2. PAF (paroxysmal atrial fibrillation) ([name])   3. High risk medication use    PLAN:    In order of problems listed above:  1. He did not have acute coronary syndrome, his perfusion study is best viewed is very mildly abnormal I think he is best served by cardiac CTA to define extent and severity of coronary disease I reviewed this with patient and will be arranged in my office.  Also helpful as he is a previous smoker for pulmonary disease in the interim continue aspirin beta-blocker 2. Continue flecainide await results of CTA along with beta-blocker   Next appointment: 6 weeks   Medication Adjustments/Labs and Tests Ordered: Current medicines are reviewed at length with the patient today.  Concerns regarding medicines are outlined above.  No orders of the defined types were placed in this encounter.  No orders of the defined types were placed in this encounter.   No chief complaint on file.   History of Present Illness:    Steve Frye is a 74 y.o. male with a hx of PAF CHADSvasc 2 of 1 on flecanide  last seen 11/20/2019 no shortness of breath and concern of acute coronary syndrome.  He underwent a facilitated evaluation with both normal 1 view and echocardiogram.  His high-sensitivity troponin was low range as well as his N-terminal proBNP.  His myocardial perfusion study showed a small reversible defect I have asked him undergo cardiac CTA is also helpful as he is a previous smoker with 25-pack-year smoking history. Compliance with diet, lifestyle and medications: Yes  I reviewed his testing with him.  Myoview 08/24/2019: Study Highlights  The left ventricular ejection fraction is normal (55-65%).  Nuclear  stress EF: 56%.  There was no ST segment deviation noted during stress.  No T wave inversion was noted during stress.  Defect 1: There is a small reversible defect of mild severity present in the mid anteroseptal location with normal wall motion.  Findings consistent with ischemia.  This is a low risk study.   Echo 08/15/2019 1. Left ventricular ejection fraction, by estimation, is 60 to 65%. The  left ventricle has normal function. The left ventricle has no regional  wall motion abnormalities. There is mild left ventricular hypertrophy.  2. Right ventricular systolic function is normal. The right ventricular  size is normal. There is moderately elevated pulmonary artery systolic  pressure.  3. Left atrial size was mildly dilated.  4. The mitral valve is normal in structure. Mild mitral valve  regurgitation. No evidence of mitral stenosis.    Ref Range & Units 2 wk ago  NT-Pro BNP 0 - 376 pg/mL 178     Ref Range & Units 2 wk ago  Troponin T (Highly Sensitive) 0 - 22 ng/L 7     Past Medical History:  Diagnosis Date  . Arthritis 01/15/2017  . Atrial fibrillation ([place])   . Chest mass 12/04/2015  . Cutaneous abscess of chest wall 12/04/2015  . Epidermoid cyst of skin 12/04/2015  . GERD (gastroesophageal reflux disease) 01/15/2017  . High risk medication use 01/10/2015   Overview:  Overview:  flecanide  . Hyperlipidemia 01/15/2017  . LAE (left atrial  enlargement) 01/15/2017  . Open wound of chest wall 04/01/2016  . PAF (paroxysmal atrial fibrillation) ([place]) 01/10/2015   Overview:  CHADS2 vasc score=1    Past Surgical History:  Procedure Laterality Date  . APPENDECTOMY    . CHOLECYSTECTOMY    . CYST EXCISION    . LAPAROSCOPIC LYSIS INTESTINAL ADHESIONS  2012    Current Medications: Current Meds  Medication Sig  . aspirin EC 81 MG tablet Take 81 mg daily by mouth.  . bifidobacterium infantis (ALIGN) capsule Take 1 capsule daily by mouth.  . famotidine (PEPCID) 40 MG tablet  TAKE 1 TABLET BY MOUTH EVERY DAY  . finasteride (PROSCAR) 5 MG tablet Take 5 mg daily by mouth.  . flecainide (TAMBOCOR) 50 MG tablet Take 1 tablet (50 mg total) by mouth 2 (two) times daily.  . Loratadine (CLARITIN PO) Take by mouth.  . metoprolol succinate (TOPROL-XL) 50 MG 24 hr tablet TAKE 1 TABLET (50 MG TOTAL) BY MOUTH EVERY OTHER DAY.  Marland Kitchen Nutritional Supplements (FRUIT & VEGETABLE DAILY PO) Take 2 tablets daily by mouth.  . simvastatin (ZOCOR) 40 MG tablet TAKE 1 TABLET BY MOUTH EVERY DAY  . triamcinolone (NASACORT ALLERGY 24HR) 55 MCG/ACT AERO nasal inhaler Place 2 sprays into the nose daily.  Marland Kitchen triamcinolone ointment (KENALOG) 0.1 % Apply 1 application topically 3 (three) times daily as needed.     Allergies:   Penicillins   Social History   Socioeconomic History  . Marital status: Married    Spouse name: Not on file  . Number of children: Not on file  . Years of education: Not on file  . Highest education level: Not on file  Occupational History  . Not on file  Tobacco Use  . Smoking status: Former Smoker    Years: 20.00    Types: Cigarettes    Quit date: 01/10/1979    Years since quitting: 40.6  . Smokeless tobacco: Never Used  Substance and Sexual Activity  . Alcohol use: No  . Drug use: No  . Sexual activity: Not on file  Other Topics Concern  . Not on file  Social History Narrative  . Not on file   Social Determinants of Health   Financial Resource Strain:   . Difficulty of Paying Living Expenses:   Food Insecurity:   . Worried About Charity fundraiser in the Last Year:   . Arboriculturist in the Last Year:   Transportation Needs:   . Film/video editor (Medical):   Marland Kitchen Lack of Transportation (Non-Medical):   Physical Activity:   . Days of Exercise per Week:   . Minutes of Exercise per Session:   Stress:   . Feeling of Stress :   Social Connections:   . Frequency of Communication with Friends and Family:   . Frequency of Social Gatherings with  Friends and Family:   . Attends Religious Services:   . Active Member of Clubs or Organizations:   . Attends Archivist Meetings:   Marland Kitchen Marital Status:      Family History: The patient's family history includes Heart disease in his father; Heart failure in his father. ROS:   Please see the history of present illness.    All other systems reviewed and are negative.  EKGs/Labs/Other Studies Reviewed:    The following studies were reviewed today:   Recent Labs: 08/11/2019: NT-Pro BNP 178  Recent Lipid Panel    Component Value Date/Time   CHOL 150 03/12/2017  1034   TRIG 122 03/12/2017 1034   HDL 40 03/12/2017 1034   CHOLHDL 3.8 03/12/2017 1034   LDLCALC 86 03/12/2017 1034    Physical Exam:    VS:  BP 120/70   Pulse 60   Ht 6' (1.829 m)   Wt 219 lb (99.3 kg)   BMI 29.70 kg/m     Wt Readings from Last 3 Encounters:  08/30/19 219 lb (99.3 kg)  08/23/19 220 lb (99.8 kg)  08/11/19 220 lb (99.8 kg)     GEN:  Well nourished, well developed in no acute distress HEENT: Normal NECK: No JVD; No carotid bruits LYMPHATICS: No lymphadenopathy CARDIAC: RRR, no murmurs, rubs, gallops RESPIRATORY:  Clear to auscultation without rales, wheezing or rhonchi  ABDOMEN: Soft, non-tender, non-distended MUSCULOSKELETAL:  No edema; No deformity  SKIN: Warm and dry NEUROLOGIC:  Alert and oriented x 3 PSYCHIATRIC:  Normal affect    Signed, Shirlee More, MD  08/30/2019 8:25 AM    [facility]

## 2019-08-30 ENCOUNTER — Other Ambulatory Visit: Payer: Self-pay

## 2019-08-30 ENCOUNTER — Encounter: Payer: Self-pay | Admitting: Cardiology

## 2019-08-30 ENCOUNTER — Ambulatory Visit (INDEPENDENT_AMBULATORY_CARE_PROVIDER_SITE_OTHER): Payer: Medicare Other | Admitting: Cardiology

## 2019-08-30 VITALS — BP 120/70 | HR 60 | Ht 72.0 in | Wt 219.0 lb

## 2019-08-30 DIAGNOSIS — R931 Abnormal findings on diagnostic imaging of heart and coronary circulation: Secondary | ICD-10-CM

## 2019-08-30 DIAGNOSIS — I480 Paroxysmal atrial fibrillation: Secondary | ICD-10-CM

## 2019-08-30 DIAGNOSIS — R9439 Abnormal result of other cardiovascular function study: Secondary | ICD-10-CM

## 2019-08-30 DIAGNOSIS — Z79899 Other long term (current) drug therapy: Secondary | ICD-10-CM

## 2019-08-30 DIAGNOSIS — R9431 Abnormal electrocardiogram [ECG] [EKG]: Secondary | ICD-10-CM | POA: Diagnosis not present

## 2019-08-30 DIAGNOSIS — R933 Abnormal findings on diagnostic imaging of other parts of digestive tract: Secondary | ICD-10-CM

## 2019-08-30 DIAGNOSIS — R9430 Abnormal result of cardiovascular function study, unspecified: Secondary | ICD-10-CM

## 2019-08-30 LAB — BASIC METABOLIC PANEL
BUN/Creatinine Ratio: 14 (ref 10–24)
BUN: 11 mg/dL (ref 8–27)
CO2: 22 mmol/L (ref 20–29)
Calcium: 9.2 mg/dL (ref 8.6–10.2)
Chloride: 104 mmol/L (ref 96–106)
Creatinine, Ser: 0.81 mg/dL (ref 0.76–1.27)
GFR calc Af Amer: 101 mL/min/{1.73_m2} (ref >59)
GFR calc non Af Amer: 88 mL/min/{1.73_m2} (ref >59)
Glucose: 90 mg/dL (ref 65–99)
Potassium: 4.3 mmol/L (ref 3.5–5.2)
Sodium: 137 mmol/L (ref 134–144)

## 2019-08-30 NOTE — Patient Instructions (Signed)
Medication Instructions:  Your physician recommends that you continue on your current medications as directed. Please refer to the Current Medication list given to you today.  *If you need a refill on your cardiac medications before your next appointment, please call your pharmacy*   Lab Work: Your physician recommends that you return for lab work in: TODAY  BMP If you have labs (blood work) drawn today and your tests are completely normal, you will receive your results only by: Marland Kitchen MyChart Message (if you have MyChart) OR . A paper copy in the mail If you have any lab test that is abnormal or we need to change your treatment, we will call you to review the results.   Testing/Procedures: Your cardiac CT will be scheduled at the below location:   [facility] [address] [place], [place] [address] [phone]   If scheduled at [facility], please arrive at the [facility] main entrance of [facility] 30 minutes prior to test start time. Proceed to the [facility] Radiology Department (first floor) to check-in and test prep.   Please follow these instructions carefully (unless otherwise directed):  Hold all erectile dysfunction medications at least 3 days (72 hrs) prior to test.  On the Night Before the Test: . Be sure to Drink plenty of water. . Do not consume any caffeinated/decaffeinated beverages or chocolate 12 hours prior to your test. . Do not take any antihistamines 12 hours prior to your test.  On the Day of the Test: . Drink plenty of water. Do not drink any water within one hour of the test. . Do not eat any food 4 hours prior to the test. . You may take your regular medications prior to the test.  . Take metoprolol (Lopressor) two hours prior to test.       After the Test: . Drink plenty of water. . After receiving IV contrast, you may experience a mild flushed feeling. This is normal. . On occasion, you may experience a mild rash  up to 24 hours after the test. This is not dangerous. If this occurs, you can take Benadryl 25 mg and increase your fluid intake. . If you experience trouble breathing, this can be serious. If it is severe call 911 IMMEDIATELY. If it is mild, please call our office. . If you take any of these medications: Glipizide/Metformin, Avandament, Glucavance, please do not take 48 hours after completing test unless otherwise instructed.   Once we have confirmed authorization from your insurance company, we will call you to set up a date and time for your test.   For non-scheduling related questions, please contact the cardiac imaging nurse navigator should you have any questions/concerns: Marchia Bond, RN Navigator Cardiac Imaging [facility] [phone] office  For scheduling needs, including cancellations and rescheduling, please call [phone].      Follow-Up: At [facility], you and your health needs are our priority.  As part of our continuing mission to provide you with exceptional heart care, we have created designated Provider Care Teams.  These Care Teams include your primary Cardiologist (physician) and Advanced Practice Providers (APPs -  Physician Assistants and Nurse Practitioners) who all work together to provide you with the care you need, when you need it.  We recommend signing up for the patient portal called "MyChart".  Sign up information is provided on this After Visit Summary.  MyChart is used to connect with patients for Virtual Visits (  Telemedicine).  Patients are able to view lab/test results, encounter notes, upcoming appointments, etc.  Non-urgent messages can be sent to your provider as well.   To learn more about what you can do with MyChart, go to [email or web address].    Your next appointment:   6 week(s)  The format for your next appointment:   In Person  Provider:   Shirlee More, MD   Other Instructions

## 2019-08-31 ENCOUNTER — Telehealth: Payer: Self-pay

## 2019-08-31 NOTE — Telephone Encounter (Signed)
-----   Message from Richardo Priest, MD sent at 08/30/2019  7:44 PM EDT ----- Normal or stable result

## 2019-08-31 NOTE — Telephone Encounter (Signed)
Spoke with patients wife regarding results and recommendation.  She verbalizes understanding and is agreeable to plan of care. Advised patient/wife to call back with any issues or concerns.  

## 2019-09-05 NOTE — Telephone Encounter (Signed)
Afrin should be used on as-needed basis.  Not daily.  I do recommend he use it daily for preventative measures.  He should see his PCP if he needs a daily inhaler discussed that but I do suggest that as long as he is using it as needed he should be fine.  In the meantime find out how he feels after he uses it is -does he have worsening palpitations when he uses medication?

## 2019-09-12 ENCOUNTER — Telehealth: Payer: Self-pay

## 2019-09-12 NOTE — Telephone Encounter (Signed)
-----   Message from Richardo Priest, MD sent at 09/10/2019 11:28 AM EDT ----- Normal or stable result  Good result no episodes of atrial fibrillation

## 2019-09-12 NOTE — Telephone Encounter (Signed)
Follow up   Patient is returning call in reference to test results.

## 2019-09-12 NOTE — Telephone Encounter (Signed)
Left message on patients voicemail to please return our call.   

## 2019-09-12 NOTE — Telephone Encounter (Signed)
Spoke with patient regarding results and recommendation.  Patient verbalizes understanding and is agreeable to plan of care. Advised patient to call back with any issues or concerns.  

## 2019-09-15 ENCOUNTER — Telehealth (HOSPITAL_COMMUNITY): Payer: Self-pay | Admitting: *Deleted

## 2019-09-15 NOTE — Telephone Encounter (Signed)
Attempted to call patient regarding upcoming cardiac CT appointment. Left message on voicemail with name and callback number  Deshannon Seide Tai RN Navigator Cardiac Imaging [facility] [phone] Office [phone] Cell  

## 2019-09-16 ENCOUNTER — Ambulatory Visit (HOSPITAL_COMMUNITY)
Admission: RE | Admit: 2019-09-16 | Discharge: 2019-09-16 | Disposition: A | Discharge status: Home / Self Care | Payer: Medicare Other | Source: Ambulatory Visit | Attending: Cardiology | Admitting: Cardiology

## 2019-09-16 ENCOUNTER — Other Ambulatory Visit: Payer: Self-pay

## 2019-09-16 DIAGNOSIS — I2510 Atherosclerotic heart disease of native coronary artery without angina pectoris: Secondary | ICD-10-CM | POA: Diagnosis not present

## 2019-09-16 DIAGNOSIS — R933 Abnormal findings on diagnostic imaging of other parts of digestive tract: Secondary | ICD-10-CM | POA: Diagnosis present

## 2019-09-16 DIAGNOSIS — R9430 Abnormal result of cardiovascular function study, unspecified: Secondary | ICD-10-CM | POA: Diagnosis present

## 2019-09-16 DIAGNOSIS — R931 Abnormal findings on diagnostic imaging of heart and coronary circulation: Secondary | ICD-10-CM | POA: Insufficient documentation

## 2019-09-16 MED ORDER — NITROGLYCERIN 0.4 MG SL SUBL
SUBLINGUAL_TABLET | SUBLINGUAL | Status: AC
  Filled 2019-09-16: qty 2

## 2019-09-16 MED ORDER — IOHEXOL 350 MG/ML SOLN
100.0000 mL | Freq: Once | INTRAVENOUS | Status: AC | PRN
  Administered 2019-09-16: 100 mL via INTRAVENOUS

## 2019-09-16 MED ORDER — NITROGLYCERIN 0.4 MG SL SUBL
0.8000 mg | SUBLINGUAL_TABLET | Freq: Once | SUBLINGUAL | Status: AC
  Administered 2019-09-16: 0.8 mg via SUBLINGUAL

## 2019-09-16 NOTE — Progress Notes (Signed)
CT scan completed. Tolerated well. D/C home ambulatory with wife, awake and alert. In no distress

## 2019-09-19 ENCOUNTER — Telehealth: Payer: Self-pay

## 2019-09-19 DIAGNOSIS — Z6829 Body mass index (BMI) 29.0-29.9, adult: Secondary | ICD-10-CM

## 2019-09-19 DIAGNOSIS — R931 Abnormal findings on diagnostic imaging of heart and coronary circulation: Secondary | ICD-10-CM

## 2019-09-19 NOTE — H&P (View-Only) (Signed)
Cardiology Office Note:    Date:  09/20/2019   ID:  Steve Frye, DOB 1945/05/21, MRN [id number]  PCP:  Street, Sharon Mt, MD  Cardiologist:  Shirlee More, MD    Referring MD: [address], Sharon Mt, *    ASSESSMENT:    1. Coronary artery disease of native artery of native heart with stable angina pectoris ([place])   2. PAF (paroxysmal atrial fibrillation) ([place])   3. High risk medication use   4. Hyperlipidemia, unspecified hyperlipidemia type    PLAN:    In order of problems listed above:  1. Despite mild ischemia on nuclear perfusion study CTA shows diffuse CAD high risk markers will undergo coronary angiography.  He is not having unstable symptoms.  Continue his aspirin beta-blocker and statin. 2. Angiography if it confirms severe CAD needs revascularization will need to discontinue flecainide. 3. Continue with statin   Next appointment: 6 weeks   Medication Adjustments/Labs and Tests Ordered: Current medicines are reviewed at length with the patient today.  Concerns regarding medicines are outlined above.  Orders Placed This Encounter  Procedures  . CBC  . Basic Metabolic Panel (BMET)  . EKG 12-Lead   No orders of the defined types were placed in this encounter.   Chief Complaint  Patient presents with  . Follow-up    Abnormal cardiac CTA    History of Present Illness:    Steve Frye is a 74 y.o. male with a hx of  PAF CHADSvasc 2 of 1 on flecanide  last seen 08/30/2019 with shortness of breath and a concern of acute coronary syndrome.  He underwent a facilitated evaluation with both troponin and echocardiogram.  His high-sensitivity troponin was low range as well as his N-terminal proBNP.  His myocardial perfusion study showed a small reversible defect  He was last seen 08/30/2019.  He is seen today as a work in with a very abnormal cardiac CTA showing severe multivessel CAD and concern for left main coronary artery stenosis.   IMPRESSION: 1. Coronary artery  calcium score 427 Agatston units. This places the patient in the 63rd percentile for age and gender, suggesting intermediate risk for future cardiac events. 2.  Suspected severe ostial RCA stenosis. 3. Extensive left main plaque, concern for severe stenosis proximally with a small aneurysmal area distally. 4.  Suspect moderate stenosis within the LCx system.Fractional flow reserve is also quite abnormal: 5.  Moderate stenosis proximal LAD.   FINDINGS: FFR 0.58 proximal RCA  FFR 0.7 ostial LAD  FFR 0.66 proximal LCx  IMPRESSION: This study is consistent with severe ostial RCA stenosis and severe stenosis of the left main coronary artery  Compliance with diet, lifestyle and medications: Yes  Review the results of his cardiac CTA with the patient he had actually looked at in my chart and understood it was abnormal.  He has a stable pattern of what seems like typical angina he was cutting the grass pushing a more rounded incline and had tightness in his chest and shortness of breath that resolved with rest.  We have previously checked a troponin is outpatient.  After reviewing the results of his cardiac CTA showing severe ostial right coronary artery stenosis and concerns of left main coronary stenosis risk benefits and options he agrees to undergo coronary angiography.  His wife is present participated in discussion and decision making.  He will be set up for this week.  He has no dye allergy or contraindication antiplatelet therapy.  Renal function is normal.  At  this time I would like A. fib on flecainide if angiography confirms severe CAD requires revascularization will need to be discontinued Past Medical History:  Diagnosis Date  . Arthritis 01/15/2017  . Atrial fibrillation ([place])   . Chest mass 12/04/2015  . Cutaneous abscess of chest wall 12/04/2015  . Epidermoid cyst of skin 12/04/2015  . GERD (gastroesophageal reflux disease) 01/15/2017  . High risk medication use 01/10/2015    Overview:  Overview:  flecanide  . Hyperlipidemia 01/15/2017  . LAE (left atrial enlargement) 01/15/2017  . Open wound of chest wall 04/01/2016  . PAF (paroxysmal atrial fibrillation) ([place]) 01/10/2015   Overview:  CHADS2 vasc score=1    Past Surgical History:  Procedure Laterality Date  . APPENDECTOMY    . CHOLECYSTECTOMY    . CYST EXCISION    . LAPAROSCOPIC LYSIS INTESTINAL ADHESIONS  2012    Current Medications: Current Meds  Medication Sig  . aspirin EC 81 MG tablet Take 81 mg daily by mouth.  . bifidobacterium infantis (ALIGN) capsule Take 1 capsule daily by mouth.  . famotidine (PEPCID) 40 MG tablet TAKE 1 TABLET BY MOUTH EVERY DAY  . finasteride (PROSCAR) 5 MG tablet Take 5 mg daily by mouth.  . flecainide (TAMBOCOR) 50 MG tablet Take 1 tablet (50 mg total) by mouth 2 (two) times daily.  . Loratadine (CLARITIN PO) Take by mouth.  . metoprolol succinate (TOPROL-XL) 50 MG 24 hr tablet TAKE 1 TABLET (50 MG TOTAL) BY MOUTH EVERY OTHER DAY.  Marland Kitchen Nutritional Supplements (FRUIT & VEGETABLE DAILY PO) Take 2 tablets daily by mouth.  . simvastatin (ZOCOR) 40 MG tablet TAKE 1 TABLET BY MOUTH EVERY DAY  . triamcinolone (NASACORT ALLERGY 24HR) 55 MCG/ACT AERO nasal inhaler Place 2 sprays into the nose daily.  Marland Kitchen triamcinolone ointment (KENALOG) 0.1 % Apply 1 application topically 3 (three) times daily as needed.     Allergies:   Penicillins   Social History   Socioeconomic History  . Marital status: Married    Spouse name: Not on file  . Number of children: Not on file  . Years of education: Not on file  . Highest education level: Not on file  Occupational History  . Not on file  Tobacco Use  . Smoking status: Former Smoker    Years: 20.00    Types: Cigarettes    Quit date: 01/10/1979    Years since quitting: 40.7  . Smokeless tobacco: Never Used  Substance and Sexual Activity  . Alcohol use: No  . Drug use: No  . Sexual activity: Not on file  Other Topics Concern  . Not on  file  Social History Narrative  . Not on file   Social Determinants of Health   Financial Resource Strain:   . Difficulty of Paying Living Expenses:   Food Insecurity:   . Worried About Charity fundraiser in the Last Year:   . Arboriculturist in the Last Year:   Transportation Needs:   . Film/video editor (Medical):   Marland Kitchen Lack of Transportation (Non-Medical):   Physical Activity:   . Days of Exercise per Week:   . Minutes of Exercise per Session:   Stress:   . Feeling of Stress :   Social Connections:   . Frequency of Communication with Friends and Family:   . Frequency of Social Gatherings with Friends and Family:   . Attends Religious Services:   . Active Member of Clubs or Organizations:   . Attends  Club or Organization Meetings:   Marland Kitchen Marital Status:      Family History: The patient's family history includes Heart disease in his father; Heart failure in his father. ROS:   Please see the history of present illness.    All other systems reviewed and are negative.  EKGs/Labs/Other Studies Reviewed:    The following studies were reviewed today:  EKG:  EKG ordered today and personally reviewed.  The ekg ordered today demonstrates sinus rhythm no evidence of 1C antiarrhythmic toxicity no ischemic changes  Recent Labs: 08/11/2019: NT-Pro BNP 178 08/30/2019: BUN 11; Creatinine, Ser 0.81; Potassium 4.3; Sodium 137  Recent Lipid Panel    Component Value Date/Time   CHOL 150 03/12/2017 1034   TRIG 122 03/12/2017 1034   HDL 40 03/12/2017 1034   CHOLHDL 3.8 03/12/2017 1034   LDLCALC 86 03/12/2017 1034    Physical Exam:    VS:  BP 130/78 (BP Location: Right Arm, Patient Position: Sitting, Cuff Size: Normal)   Pulse 62   Ht 6' (1.829 m)   Wt 218 lb (98.9 kg)   SpO2 96%   BMI 29.57 kg/m     Wt Readings from Last 3 Encounters:  09/20/19 218 lb (98.9 kg)  08/30/19 219 lb (99.3 kg)  08/23/19 220 lb (99.8 kg)     GEN:  Well nourished, well developed in no acute  distress HEENT: Normal NECK: No JVD; No carotid bruits LYMPHATICS: No lymphadenopathy CARDIAC:RRR, no murmurs, rubs, gallops RESPIRATORY:  Clear to auscultation without rales, wheezing or rhonchi  ABDOMEN: Soft, non-tender, non-distended MUSCULOSKELETAL:  No edema; No deformity  SKIN: Warm and dry NEUROLOGIC:  Alert and oriented x 3 PSYCHIATRIC:  Normal affect    Signed, Shirlee More, MD  09/20/2019 8:48 AM    [place]

## 2019-09-19 NOTE — Telephone Encounter (Signed)
Spoke with patient regarding results and recommendation. Patient scheduled to see Dr. Bettina Gavia at 8:20 on 09/20/19.  Patient verbalizes understanding and is agreeable to plan of care. Advised patient to call back with any issues or concerns.

## 2019-09-19 NOTE — Telephone Encounter (Signed)
-----   Message from Richardo Priest, MD sent at 09/19/2019  9:40 AM EDT ----- His cardiac CTA is quite abnormal please see if we can get him in the office to see me in the office tomorrow if need double book

## 2019-09-19 NOTE — Telephone Encounter (Signed)
Radiology called and wanted to make you aware that put an addendum on the CT FFR result.

## 2019-09-19 NOTE — Progress Notes (Signed)
Cardiology Office Note:    Date:  09/20/2019   ID:  Steve Frye, DOB [birth year], MRN [id number]  PCP:  Street, Sharon Mt, MD  Cardiologist:  Shirlee More, MD    Referring MD: [address], Sharon Mt, *    ASSESSMENT:    1. Coronary artery disease of native artery of native heart with stable angina pectoris ([place])   2. PAF (paroxysmal atrial fibrillation) ([place])   3. High risk medication use   4. Hyperlipidemia, unspecified hyperlipidemia type    PLAN:    In order of problems listed above:  1. Despite mild ischemia on nuclear perfusion study CTA shows diffuse CAD high risk markers will undergo coronary angiography.  He is not having unstable symptoms.  Continue his aspirin beta-blocker and statin. 2. Angiography if it confirms severe CAD needs revascularization will need to discontinue flecainide. 3. Continue with statin   Next appointment: 6 weeks   Medication Adjustments/Labs and Tests Ordered: Current medicines are reviewed at length with the patient today.  Concerns regarding medicines are outlined above.  Orders Placed This Encounter  Procedures  . CBC  . Basic Metabolic Panel (BMET)  . EKG 12-Lead   No orders of the defined types were placed in this encounter.   Chief Complaint  Patient presents with  . Follow-up    Abnormal cardiac CTA    History of Present Illness:    Steve Frye is a 74 y.o. male with a hx of  PAF CHADSvasc 2 of 1 on flecanide  last seen 08/30/2019 with shortness of breath and a concern of acute coronary syndrome.  He underwent a facilitated evaluation with both troponin and echocardiogram.  His high-sensitivity troponin was low range as well as his N-terminal proBNP.  His myocardial perfusion study showed a small reversible defect  He was last seen 08/30/2019.  He is seen today as a work in with a very abnormal cardiac CTA showing severe multivessel CAD and concern for left main coronary artery stenosis.   IMPRESSION: 1. Coronary artery  calcium score 427 Agatston units. This places the patient in the 63rd percentile for age and gender, suggesting intermediate risk for future cardiac events. 2.  Suspected severe ostial RCA stenosis. 3. Extensive left main plaque, concern for severe stenosis proximally with a small aneurysmal area distally. 4.  Suspect moderate stenosis within the LCx system.Fractional flow reserve is also quite abnormal: 5.  Moderate stenosis proximal LAD.   FINDINGS: FFR 0.58 proximal RCA  FFR 0.7 ostial LAD  FFR 0.66 proximal LCx  IMPRESSION: This study is consistent with severe ostial RCA stenosis and severe stenosis of the left main coronary artery  Compliance with diet, lifestyle and medications: Yes  Review the results of his cardiac CTA with the patient he had actually looked at in my chart and understood it was abnormal.  He has a stable pattern of what seems like typical angina he was cutting the grass pushing a more rounded incline and had tightness in his chest and shortness of breath that resolved with rest.  We have previously checked a troponin is outpatient.  After reviewing the results of his cardiac CTA showing severe ostial right coronary artery stenosis and concerns of left main coronary stenosis risk benefits and options he agrees to undergo coronary angiography.  His wife is present participated in discussion and decision making.  He will be set up for this week.  He has no dye allergy or contraindication antiplatelet therapy.  Renal function is normal.  At  this time I would like A. fib on flecainide if angiography confirms severe CAD requires revascularization will need to be discontinued Past Medical History:  Diagnosis Date  . Arthritis 01/15/2017  . Atrial fibrillation ([place])   . Chest mass 12/04/2015  . Cutaneous abscess of chest wall 12/04/2015  . Epidermoid cyst of skin 12/04/2015  . GERD (gastroesophageal reflux disease) 01/15/2017  . High risk medication use 01/10/2015    Overview:  Overview:  flecanide  . Hyperlipidemia 01/15/2017  . LAE (left atrial enlargement) 01/15/2017  . Open wound of chest wall 04/01/2016  . PAF (paroxysmal atrial fibrillation) ([place]) 01/10/2015   Overview:  CHADS2 vasc score=1    Past Surgical History:  Procedure Laterality Date  . APPENDECTOMY    . CHOLECYSTECTOMY    . CYST EXCISION    . LAPAROSCOPIC LYSIS INTESTINAL ADHESIONS  2012    Current Medications: Current Meds  Medication Sig  . aspirin EC 81 MG tablet Take 81 mg daily by mouth.  . bifidobacterium infantis (ALIGN) capsule Take 1 capsule daily by mouth.  . famotidine (PEPCID) 40 MG tablet TAKE 1 TABLET BY MOUTH EVERY DAY  . finasteride (PROSCAR) 5 MG tablet Take 5 mg daily by mouth.  . flecainide (TAMBOCOR) 50 MG tablet Take 1 tablet (50 mg total) by mouth 2 (two) times daily.  . Loratadine (CLARITIN PO) Take by mouth.  . metoprolol succinate (TOPROL-XL) 50 MG 24 hr tablet TAKE 1 TABLET (50 MG TOTAL) BY MOUTH EVERY OTHER DAY.  Marland Kitchen Nutritional Supplements (FRUIT & VEGETABLE DAILY PO) Take 2 tablets daily by mouth.  . simvastatin (ZOCOR) 40 MG tablet TAKE 1 TABLET BY MOUTH EVERY DAY  . triamcinolone (NASACORT ALLERGY 24HR) 55 MCG/ACT AERO nasal inhaler Place 2 sprays into the nose daily.  Marland Kitchen triamcinolone ointment (KENALOG) 0.1 % Apply 1 application topically 3 (three) times daily as needed.     Allergies:   Penicillins   Social History   Socioeconomic History  . Marital status: Married    Spouse name: Not on file  . Number of children: Not on file  . Years of education: Not on file  . Highest education level: Not on file  Occupational History  . Not on file  Tobacco Use  . Smoking status: Former Smoker    Years: 20.00    Types: Cigarettes    Quit date: 01/10/1979    Years since quitting: 40.7  . Smokeless tobacco: Never Used  Substance and Sexual Activity  . Alcohol use: No  . Drug use: No  . Sexual activity: Not on file  Other Topics Concern  . Not on  file  Social History Narrative  . Not on file   Social Determinants of Health   Financial Resource Strain:   . Difficulty of Paying Living Expenses:   Food Insecurity:   . Worried About Charity fundraiser in the Last Year:   . Arboriculturist in the Last Year:   Transportation Needs:   . Film/video editor (Medical):   Marland Kitchen Lack of Transportation (Non-Medical):   Physical Activity:   . Days of Exercise per Week:   . Minutes of Exercise per Session:   Stress:   . Feeling of Stress :   Social Connections:   . Frequency of Communication with Friends and Family:   . Frequency of Social Gatherings with Friends and Family:   . Attends Religious Services:   . Active Member of Clubs or Organizations:   . Attends  Club or Organization Meetings:   Marland Kitchen Marital Status:      Family History: The patient's family history includes Heart disease in his father; Heart failure in his father. ROS:   Please see the history of present illness.    All other systems reviewed and are negative.  EKGs/Labs/Other Studies Reviewed:    The following studies were reviewed today:  EKG:  EKG ordered today and personally reviewed.  The ekg ordered today demonstrates sinus rhythm no evidence of 1C antiarrhythmic toxicity no ischemic changes  Recent Labs: 08/11/2019: NT-Pro BNP 178 08/30/2019: BUN 11; Creatinine, Ser 0.81; Potassium 4.3; Sodium 137  Recent Lipid Panel    Component Value Date/Time   CHOL 150 03/12/2017 1034   TRIG 122 03/12/2017 1034   HDL 40 03/12/2017 1034   CHOLHDL 3.8 03/12/2017 1034   LDLCALC 86 03/12/2017 1034    Physical Exam:    VS:  BP 130/78 (BP Location: Right Arm, Patient Position: Sitting, Cuff Size: Normal)   Pulse 62   Ht 6' (1.829 m)   Wt 218 lb (98.9 kg)   SpO2 96%   BMI 29.57 kg/m     Wt Readings from Last 3 Encounters:  09/20/19 218 lb (98.9 kg)  08/30/19 219 lb (99.3 kg)  08/23/19 220 lb (99.8 kg)     GEN:  Well nourished, well developed in no acute  distress HEENT: Normal NECK: No JVD; No carotid bruits LYMPHATICS: No lymphadenopathy CARDIAC:RRR, no murmurs, rubs, gallops RESPIRATORY:  Clear to auscultation without rales, wheezing or rhonchi  ABDOMEN: Soft, non-tender, non-distended MUSCULOSKELETAL:  No edema; No deformity  SKIN: Warm and dry NEUROLOGIC:  Alert and oriented x 3 PSYCHIATRIC:  Normal affect    Signed, Shirlee More, MD  09/20/2019 8:48 AM    [place]

## 2019-09-20 ENCOUNTER — Other Ambulatory Visit: Payer: Self-pay

## 2019-09-20 ENCOUNTER — Other Ambulatory Visit (HOSPITAL_COMMUNITY)
Admission: RE | Admit: 2019-09-20 | Discharge: 2019-09-20 | Disposition: A | Discharge status: Home / Self Care | Payer: Medicare Other | Source: Ambulatory Visit | Attending: Cardiology | Admitting: Cardiology

## 2019-09-20 ENCOUNTER — Encounter: Payer: Self-pay | Admitting: Cardiology

## 2019-09-20 ENCOUNTER — Ambulatory Visit (INDEPENDENT_AMBULATORY_CARE_PROVIDER_SITE_OTHER): Payer: Medicare Other | Admitting: Cardiology

## 2019-09-20 VITALS — BP 130/78 | HR 62 | Ht 72.0 in | Wt 218.0 lb

## 2019-09-20 DIAGNOSIS — Z01812 Encounter for preprocedural laboratory examination: Secondary | ICD-10-CM | POA: Insufficient documentation

## 2019-09-20 DIAGNOSIS — Z20822 Contact with and (suspected) exposure to covid-19: Secondary | ICD-10-CM | POA: Insufficient documentation

## 2019-09-20 DIAGNOSIS — E785 Hyperlipidemia, unspecified: Secondary | ICD-10-CM

## 2019-09-20 DIAGNOSIS — Z79899 Other long term (current) drug therapy: Secondary | ICD-10-CM | POA: Diagnosis not present

## 2019-09-20 DIAGNOSIS — I480 Paroxysmal atrial fibrillation: Secondary | ICD-10-CM

## 2019-09-20 DIAGNOSIS — I25118 Atherosclerotic heart disease of native coronary artery with other forms of angina pectoris: Secondary | ICD-10-CM | POA: Diagnosis not present

## 2019-09-20 LAB — CBC
Hematocrit: 43.1 % (ref 37.5–51.0)
Hemoglobin: 15 g/dL (ref 13.0–17.7)
MCH: 30.9 pg (ref 26.6–33.0)
MCHC: 34.8 g/dL (ref 31.5–35.7)
MCV: 89 fL (ref 79–97)
Platelets: 204 10*3/uL (ref 150–450)
RBC: 4.85 x10E6/uL (ref 4.14–5.80)
RDW: 12.4 % (ref 11.6–15.4)
WBC: 6.9 10*3/uL (ref 3.4–10.8)

## 2019-09-20 LAB — SARS CORONAVIRUS 2 (TAT 6-24 HRS): SARS Coronavirus 2: NEGATIVE

## 2019-09-20 LAB — BASIC METABOLIC PANEL
BUN/Creatinine Ratio: 13 (ref 10–24)
BUN: 11 mg/dL (ref 8–27)
CO2: 20 mmol/L (ref 20–29)
Calcium: 9 mg/dL (ref 8.6–10.2)
Chloride: 104 mmol/L (ref 96–106)
Creatinine, Ser: 0.85 mg/dL (ref 0.76–1.27)
GFR calc Af Amer: 99 mL/min/{1.73_m2} (ref >59)
GFR calc non Af Amer: 86 mL/min/{1.73_m2} (ref >59)
Glucose: 142 mg/dL — ABNORMAL HIGH (ref 65–99)
Potassium: 4.2 mmol/L (ref 3.5–5.2)
Sodium: 138 mmol/L (ref 134–144)

## 2019-09-20 NOTE — Patient Instructions (Signed)
Medication Instructions:  Your physician recommends that you continue on your current medications as directed. Please refer to the Current Medication list given to you today.  *If you need a refill on your cardiac medications before your next appointment, please call your pharmacy*   Lab Work: Your physician recommends that you return for lab work in: TODAY CBC, BMP If you have labs (blood work) drawn today and your tests are completely normal, you will receive your results only by: Marland Kitchen MyChart Message (if you have MyChart) OR . A paper copy in the mail If you have any lab test that is abnormal or we need to change your treatment, we will call you to review the results.   Testing/Procedures:    [place] [address] Dept: [phone] Loc: [place]  09/20/2019  You are scheduled for a Cardiac Catheterization on Friday, May 28 with Dr. Glenetta Hew.  1. Please arrive at the [facility] at [facility]: [address] [place], [place] [address] at 5:30 AM (This time is two hours before your procedure to ensure your preparation). Free valet parking service is available.   Special note: Every effort is made to have your procedure done on time. Please understand that emergencies sometimes delay scheduled procedures.  2. Diet: Do not eat solid foods after midnight.  The patient may have clear liquids until 5am upon the day of the procedure.  3. Labs: You will need to have blood drawn on TODAY 4. Medication instructions in preparation for your procedure:   Contrast Allergy: No   On the morning of your procedure, take your Aspirin and any morning medicines NOT listed above.  You may use sips of water.  5. Plan for one night stay--bring personal belongings. 6. Bring a current list of your medications and current insurance cards. 7. You  MUST have a responsible person to drive you home. 8. Someone MUST be with you the first 24 hours after you arrive home or your discharge will be delayed. 9. Please wear clothes that are easy to get on and off and wear slip-on shoes.  Thank you for allowing [place] to care for you!   -- [place] [facility]     Follow-Up: At [facility], you and your health needs are our priority.  As part of our continuing mission to provide you with exceptional heart care, we have created designated Provider Care Teams.  These Care Teams include your primary Cardiologist (physician) and Advanced Practice Providers (APPs -  Physician Assistants and Nurse Practitioners) who all work together to provide you with the care you need, when you need it.  We recommend signing up for the patient portal called "MyChart".  Sign up information is provided on this After Visit Summary.  MyChart is used to connect with patients for Virtual Visits (Telemedicine).  Patients are able to view lab/test results, encounter notes, upcoming appointments, etc.  Non-urgent messages can be sent to your provider as well.   To learn more about what you can do with MyChart, go to [email or web address].    Your next appointment:   4 week(s)  The format for your next appointment:   In Person  Provider:   Shirlee More, MD   Other Instructions

## 2019-09-20 NOTE — Addendum Note (Signed)
Addended by: Shirlee More on: 09/20/2019 09:19 AM   Modules accepted: Orders, Level of Service, SmartSet

## 2019-09-22 ENCOUNTER — Telehealth: Payer: Self-pay | Admitting: *Deleted

## 2019-09-22 NOTE — Telephone Encounter (Signed)
Pt contacted pre-catheterization scheduled at [facility] for: Friday Sep 23, 2019 7:30 AM Verified arrival time and place: [place]) at: 5:30 AM   No solid food after midnight prior to cath, clear liquids until 5 AM day of procedure.   AM meds can be  taken pre-cath with sip of water including: ASA 81 mg   Confirmed patient has responsible adult to drive home post procedure and observe 24 hours after arriving home: yes  You are allowed ONE visitor in the waiting room during your procedure. Both you and your visitor must wear masks.      COVID-19 Pre-Screening Questions:  . In the past 7 to 10 days have you had a cough,  shortness of breath, headache, congestion, fever (100 or greater) body aches, chills, sore throat, or sudden loss of taste or sense of smell? no . Have you been around anyone with known Covid 19 in the past 7 to 10 days? no . Have you been around anyone who is awaiting Covid 19 test results in the past 7 to 10 days? no . Have you been around anyone who has mentioned symptoms of Covid 19 within the past 7 to 10 days? No  Reviewed procedure/mask/visitor instructions, COVID-19 screening questions with patient.

## 2019-09-23 ENCOUNTER — Other Ambulatory Visit: Payer: Self-pay

## 2019-09-23 ENCOUNTER — Other Ambulatory Visit: Payer: Self-pay | Admitting: Physician Assistant

## 2019-09-23 ENCOUNTER — Ambulatory Visit (HOSPITAL_COMMUNITY)
Admission: RE | Admit: 2019-09-23 | Discharge: 2019-09-23 | Disposition: A | Discharge status: Home / Self Care | Payer: Medicare Other | Attending: Interventional Cardiology | Admitting: Interventional Cardiology

## 2019-09-23 ENCOUNTER — Encounter (HOSPITAL_COMMUNITY)
Admission: RE | Disposition: A | Discharge status: Home / Self Care | Payer: Self-pay | Source: Home / Self Care | Attending: Interventional Cardiology

## 2019-09-23 DIAGNOSIS — Z880 Allergy status to penicillin: Secondary | ICD-10-CM | POA: Insufficient documentation

## 2019-09-23 DIAGNOSIS — R222 Localized swelling, mass and lump, trunk: Secondary | ICD-10-CM | POA: Diagnosis present

## 2019-09-23 DIAGNOSIS — I25118 Atherosclerotic heart disease of native coronary artery with other forms of angina pectoris: Secondary | ICD-10-CM

## 2019-09-23 DIAGNOSIS — Z7982 Long term (current) use of aspirin: Secondary | ICD-10-CM | POA: Insufficient documentation

## 2019-09-23 DIAGNOSIS — K219 Gastro-esophageal reflux disease without esophagitis: Secondary | ICD-10-CM | POA: Insufficient documentation

## 2019-09-23 DIAGNOSIS — Z79899 Other long term (current) drug therapy: Secondary | ICD-10-CM | POA: Insufficient documentation

## 2019-09-23 DIAGNOSIS — I480 Paroxysmal atrial fibrillation: Secondary | ICD-10-CM | POA: Diagnosis present

## 2019-09-23 DIAGNOSIS — E785 Hyperlipidemia, unspecified: Secondary | ICD-10-CM | POA: Diagnosis not present

## 2019-09-23 DIAGNOSIS — Z87891 Personal history of nicotine dependence: Secondary | ICD-10-CM | POA: Insufficient documentation

## 2019-09-23 HISTORY — PX: LEFT HEART CATH AND CORONARY ANGIOGRAPHY: CATH118249

## 2019-09-23 SURGERY — LEFT HEART CATH AND CORONARY ANGIOGRAPHY
Anesthesia: LOCAL

## 2019-09-23 MED ORDER — HEPARIN (PORCINE) IN NACL 1000-0.9 UT/500ML-% IV SOLN
INTRAVENOUS | Status: DC | PRN
  Administered 2019-09-23 (×2): 500 mL

## 2019-09-23 MED ORDER — SODIUM CHLORIDE 0.9 % WEIGHT BASED INFUSION: 1.0000 mL/kg/h | INTRAVENOUS | Status: DC

## 2019-09-23 MED ORDER — SODIUM CHLORIDE 0.9 % WEIGHT BASED INFUSION
3.0000 mL/kg/h | INTRAVENOUS | Status: AC
  Administered 2019-09-23: 3 mL/kg/h via INTRAVENOUS

## 2019-09-23 MED ORDER — FENTANYL CITRATE (PF) 100 MCG/2ML IJ SOLN
INTRAMUSCULAR | Status: DC | PRN
  Administered 2019-09-23: 25 ug via INTRAVENOUS

## 2019-09-23 MED ORDER — ONDANSETRON HCL 4 MG/2ML IJ SOLN: 4.0000 mg | Freq: Four times a day (QID) | INTRAMUSCULAR | Status: DC | PRN

## 2019-09-23 MED ORDER — LIDOCAINE HCL (PF) 1 % IJ SOLN
INTRAMUSCULAR | Status: AC
  Filled 2019-09-23: qty 30

## 2019-09-23 MED ORDER — ACETAMINOPHEN 325 MG PO TABS: 650.0000 mg | ORAL_TABLET | ORAL | Status: DC | PRN

## 2019-09-23 MED ORDER — SODIUM CHLORIDE 0.9 % IV SOLN: 250.0000 mL | INTRAVENOUS | Status: DC | PRN

## 2019-09-23 MED ORDER — IOHEXOL 350 MG/ML SOLN
INTRAVENOUS | Status: DC | PRN
  Administered 2019-09-23: 95 mL

## 2019-09-23 MED ORDER — SODIUM CHLORIDE 0.9% FLUSH: 3.0000 mL | INTRAVENOUS | Status: DC | PRN

## 2019-09-23 MED ORDER — HEPARIN SODIUM (PORCINE) 1000 UNIT/ML IJ SOLN
INTRAMUSCULAR | Status: AC
  Filled 2019-09-23: qty 1

## 2019-09-23 MED ORDER — HEPARIN (PORCINE) IN NACL 1000-0.9 UT/500ML-% IV SOLN
INTRAVENOUS | Status: AC
  Filled 2019-09-23: qty 1000

## 2019-09-23 MED ORDER — MIDAZOLAM HCL 2 MG/2ML IJ SOLN
INTRAMUSCULAR | Status: DC | PRN
  Administered 2019-09-23: 1 mg via INTRAVENOUS

## 2019-09-23 MED ORDER — FENTANYL CITRATE (PF) 100 MCG/2ML IJ SOLN
INTRAMUSCULAR | Status: AC
  Filled 2019-09-23: qty 2

## 2019-09-23 MED ORDER — ASPIRIN 81 MG PO CHEW: 81.0000 mg | CHEWABLE_TABLET | Freq: Every day | ORAL | Status: DC

## 2019-09-23 MED ORDER — NITROGLYCERIN 0.4 MG SL SUBL: 0.4000 mg | SUBLINGUAL_TABLET | SUBLINGUAL | 3 refills | Status: DC | PRN

## 2019-09-23 MED ORDER — ASPIRIN 81 MG PO CHEW: 81.0000 mg | CHEWABLE_TABLET | ORAL | Status: DC

## 2019-09-23 MED ORDER — LIDOCAINE HCL (PF) 1 % IJ SOLN
INTRAMUSCULAR | Status: DC | PRN
  Administered 2019-09-23: 2 mL

## 2019-09-23 MED ORDER — SODIUM CHLORIDE 0.9% FLUSH: 3.0000 mL | Freq: Two times a day (BID) | INTRAVENOUS | Status: DC

## 2019-09-23 MED ORDER — HEPARIN SODIUM (PORCINE) 1000 UNIT/ML IJ SOLN
INTRAMUSCULAR | Status: DC | PRN
  Administered 2019-09-23: 5000 [IU] via INTRAVENOUS

## 2019-09-23 MED ORDER — VERAPAMIL HCL 2.5 MG/ML IV SOLN
INTRAVENOUS | Status: AC
  Filled 2019-09-23: qty 2

## 2019-09-23 MED ORDER — SODIUM CHLORIDE 0.9 % IV SOLN: INTRAVENOUS | Status: DC

## 2019-09-23 MED ORDER — LABETALOL HCL 5 MG/ML IV SOLN: 10.0000 mg | INTRAVENOUS | Status: DC | PRN

## 2019-09-23 MED ORDER — HYDRALAZINE HCL 20 MG/ML IJ SOLN: 10.0000 mg | INTRAMUSCULAR | Status: DC | PRN

## 2019-09-23 MED ORDER — VERAPAMIL HCL 2.5 MG/ML IV SOLN
INTRAVENOUS | Status: DC | PRN
  Administered 2019-09-23: 10 mL via INTRA_ARTERIAL

## 2019-09-23 MED ORDER — NITROGLYCERIN 0.4 MG SL SUBL: 0.4000 mg | SUBLINGUAL_TABLET | SUBLINGUAL | Status: DC | PRN

## 2019-09-23 MED ORDER — MIDAZOLAM HCL 2 MG/2ML IJ SOLN
INTRAMUSCULAR | Status: AC
  Filled 2019-09-23: qty 2

## 2019-09-23 MED ORDER — OXYCODONE HCL 5 MG PO TABS: 5.0000 mg | ORAL_TABLET | ORAL | Status: DC | PRN

## 2019-09-23 SURGICAL SUPPLY — 14 items
CATH 5FR JL3.5 JR4 ANG PIG MP (CATHETERS) ×2 IMPLANT
CATH INFINITI 5 FR IM (CATHETERS) ×2 IMPLANT
CATH INFINITI 5FR AL1 (CATHETERS) ×2 IMPLANT
CATH LAUNCHER 5F RADR (CATHETERS) ×1 IMPLANT
CATHETER LAUNCHER 5F RADR (CATHETERS) ×2
DEVICE RAD COMP TR BAND LRG (VASCULAR PRODUCTS) ×2 IMPLANT
GLIDESHEATH SLEND A-KIT 6F 22G (SHEATH) ×2 IMPLANT
GUIDEWIRE INQWIRE 1.5J.035X260 (WIRE) ×1 IMPLANT
INQWIRE 1.5J .035X260CM (WIRE) ×2
KIT HEART LEFT (KITS) ×2 IMPLANT
PACK CARDIAC CATHETERIZATION (CUSTOM PROCEDURE TRAY) ×2 IMPLANT
SHEATH PROBE COVER 6X72 (BAG) ×2 IMPLANT
TRANSDUCER W/STOPCOCK (MISCELLANEOUS) ×2 IMPLANT
TUBING CIL FLEX 10 FLL-RA (TUBING) ×2 IMPLANT

## 2019-09-23 NOTE — Discharge Instructions (Signed)
Use nitroglycerin under the tongue if chest pain unresponsive to rest.    DRINK PLENTY OF FLUIDS FOR THE NEXT 2-3 DAYS.  KEEP ARM ELEVATED THE REMAINDER OF THE DAY.  Radial Site Care  This sheet gives you information about how to care for yourself after your procedure. Your health care provider may also give you more specific instructions. If you have problems or questions, contact your health care provider. What can I expect after the procedure? After the procedure, it is common to have:  Bruising and tenderness at the catheter insertion area. Follow these instructions at home: Medicines  Take over-the-counter and prescription medicines only as told by your health care provider. Insertion site care 1. Follow instructions from your health care provider about how to take care of your insertion site. Make sure you: ? Wash your hands with soap and water before you change your bandage (dressing). If soap and water are not available, use hand sanitizer. ? Change your dressing as told by your health care provider. 2. Check your insertion site every day for signs of infection. Check for: ? Redness, swelling, or pain. ? Fluid or blood. ? Pus or a bad smell. ? Warmth. 3. Do not take baths, swim, or use a hot tub for 5 days. 4. You may shower 24-48 hours after the procedure. ? Remove the dressing and gently wash the site with plain soap and water. ? Pat the area dry with a clean towel. ? Do not rub the site. That could cause bleeding. 5. Do not apply powder or lotion to the site. Activity  1. For 24 hours after the procedure, or as directed by your health care provider: ? Do not flex or bend the affected arm. ? Do not push or pull heavy objects with the affected arm. ? Do not drive yourself home from the hospital or clinic. You may drive 24 hours after the procedure. ? Do not operate machinery or power tools. 2. Do not push, pull or lift anything that is heavier than 10 lb for 5  days. 3. Ask your health care provider when it is okay to: ? Return to work or school. ? Resume usual physical activities or sports. ? Resume sexual activity. General instructions  If the catheter site starts to bleed, raise your arm and put firm pressure on the site. If the bleeding does not stop, get help right away. This is a medical emergency.  If you went home on the same day as your procedure, a responsible adult should be with you for the first 24 hours after you arrive home.  Keep all follow-up visits as told by your health care provider. This is important. Contact a health care provider if:  You have a fever.  You have redness, swelling, or yellow drainage around your insertion site. Get help right away if:  You have unusual pain at the radial site.  The catheter insertion area swells very fast.  The insertion area is bleeding, and the bleeding does not stop when you hold steady pressure on the area.  Your arm or hand becomes pale, cool, tingly, or numb. These symptoms may represent a serious problem that is an emergency. Do not wait to see if the symptoms will go away. Get medical help right away. Call your local emergency services (911 in the [place] Do not drive yourself to the hospital. Summary  After the procedure, it is common to have bruising and tenderness at the site.  Follow instructions from  your health care provider about how to take care of your radial site wound. Check the wound every day for signs of infection.  Do not push, pull or lift anything that is heavier than 10 lb for 5 days.  This information is not intended to replace advice given to you by your health care provider. Make sure you discuss any questions you have with your health care provider. Document Revised: 05/20/2017 Document Reviewed: 05/20/2017 Elsevier Patient Education  2020 [employer].

## 2019-09-23 NOTE — Progress Notes (Signed)
TR BAND REMOVAL  LOCATION:    right radial  DEFLATED PER PROTOCOL:    Yes.    TIME BAND OFF / DRESSING APPLIED:    1030   SITE UPON ARRIVAL:    Level 0  SITE AFTER BAND REMOVAL:    Level 0  CIRCULATION SENSATION AND MOVEMENT:    Within Normal Limits   Yes.

## 2019-09-23 NOTE — Interval H&P Note (Signed)
Cath Lab Visit (complete for each Cath Lab visit)  Clinical Evaluation Leading to the Procedure:   ACS: No.  Non-ACS:    Anginal Classification: CCS Frye  Anti-ischemic medical therapy: Minimal Therapy (1 class of medications)  Non-Invasive Test Results: High-risk stress test findings: cardiac mortality >3%/year  Prior CABG: No previous CABG      History and Physical Interval Note:  09/23/2019 7:41 AM  Steve Frye  has presented today for surgery, with the diagnosis of cad.  The various methods of treatment have been discussed with the patient and family. After consideration of risks, benefits and other options for treatment, the patient has consented to  Procedure(s): LEFT HEART CATH AND CORONARY ANGIOGRAPHY (N/A) as a surgical intervention.  The patient's history has been reviewed, patient examined, no change in status, stable for surgery.  I have reviewed the patient's chart and labs.  Questions were answered to the patient's satisfaction.     Steve Frye

## 2019-09-23 NOTE — CV Procedure (Signed)
   Left heart cath with coronary angio via right radial using real-time vascular ultrasound for access.  Tortuous subclavian/innominate artery made selective engagement of the right coronary impossible. Multiple catheters were used but could never selectively engaged.  Ostial 50% and distal 75% left main  Proximal 90% LAD before bifurcation into a large diagonal and the continuation of the LAD.  Aneurysmal distal left main  Widely patent circumflex.  Known ostial left main stenosis by CT. Could not selectively engage. Distal right coronary territory needs grafting if possible.

## 2019-09-28 ENCOUNTER — Other Ambulatory Visit: Payer: Self-pay | Admitting: *Deleted

## 2019-09-28 ENCOUNTER — Encounter: Payer: Self-pay | Admitting: Cardiothoracic Surgery

## 2019-09-28 ENCOUNTER — Institutional Professional Consult (permissible substitution) (INDEPENDENT_AMBULATORY_CARE_PROVIDER_SITE_OTHER): Payer: Medicare Other | Admitting: Cardiothoracic Surgery

## 2019-09-28 ENCOUNTER — Other Ambulatory Visit: Payer: Self-pay

## 2019-09-28 VITALS — BP 140/77 | HR 64 | Temp 97.7°F | Resp 20 | Ht 72.0 in | Wt 217.0 lb

## 2019-09-28 DIAGNOSIS — I2510 Atherosclerotic heart disease of native coronary artery without angina pectoris: Secondary | ICD-10-CM

## 2019-09-28 DIAGNOSIS — I249 Acute ischemic heart disease, unspecified: Secondary | ICD-10-CM

## 2019-09-28 HISTORY — DX: Acute ischemic heart disease, unspecified: I24.9

## 2019-09-28 NOTE — Progress Notes (Signed)
PCP is Street, Sharon Mt, MD Referring Provider is Steve Crome, MD  Chief Complaint  Patient presents with  . Coronary Artery Disease    Surgical eval, Cardiac Cath 09/23/19, ECHO 08/15/19, Coronary CT 09/19/19   Patient presents with class III exertional angina with documented significant left main and three-vessel coronary artery disease by recent cardiac cath.  LV function by echocardiogram is normal. The patient has been recommended for coronary bypass grafting by his cardiologist and presents for evaluation for surgery.  HPI: Patient evaluated and images of cardiac catheterization and echocardiogram personally reviewed and discussed with the patient and wife The patient has history of paroxysmal atrial fibrillation for over 10 years with her episodes of arrhythmia documented.  He is taking aspirin and flecainide.  Developed anginal chest pain and after a cardiac CT scan was evaluated with cardiac catheterization by Dr. Daneen Frye.  This demonstrated 75% stenosis of the left main and high-grade ostial RCA stenosis.  LV function is normal by echo.  Patient's risk factors include positive family history, dyslipidemia, and mild obesity.  He is not smoking he is not a diabetic.  Past Medical History:  Diagnosis Date  . Arthritis 01/15/2017  . Atrial fibrillation ([place])   . Chest mass 12/04/2015  . Cutaneous abscess of chest wall 12/04/2015  . Epidermoid cyst of skin 12/04/2015  . GERD (gastroesophageal reflux disease) 01/15/2017  . High risk medication use 01/10/2015   Overview:  Overview:  flecanide  . Hyperlipidemia 01/15/2017  . LAE (left atrial enlargement) 01/15/2017  . Open wound of chest wall 04/01/2016  . PAF (paroxysmal atrial fibrillation) ([place]) 01/10/2015   Overview:  CHADS2 vasc score=1    Past Surgical History:  Procedure Laterality Date  . APPENDECTOMY    . CHOLECYSTECTOMY    . CYST EXCISION    . LAPAROSCOPIC LYSIS INTESTINAL ADHESIONS  2012  . LEFT HEART CATH AND CORONARY  ANGIOGRAPHY N/A 09/23/2019   Procedure: LEFT HEART CATH AND CORONARY ANGIOGRAPHY;  Surgeon: Steve Crome, MD;  Location: [facility];  Service: Cardiovascular;  Laterality: N/A;    Family History  Problem Relation Age of Onset  . Heart disease Father   . Heart failure Father     Social History Social History   Tobacco Use  . Smoking status: Former Smoker    Years: 20.00    Types: Cigarettes    Quit date: 01/10/1979    Years since quitting: 40.7  . Smokeless tobacco: Never Used  Substance Use Topics  . Alcohol use: No  . Drug use: No    Current Outpatient Medications  Medication Sig Dispense Refill  . aspirin EC 81 MG tablet Take 81 mg daily by mouth.    . famotidine (PEPCID) 40 MG tablet TAKE 1 TABLET BY MOUTH EVERY DAY (Patient taking differently: Take 40 mg by mouth daily. ) 90 tablet 1  . finasteride (PROSCAR) 5 MG tablet Take 5 mg daily by mouth.  2  . flecainide (TAMBOCOR) 50 MG tablet Take 1 tablet (50 mg total) by mouth 2 (two) times daily. 180 tablet 3  . metoprolol succinate (TOPROL-XL) 50 MG 24 hr tablet TAKE 1 TABLET (50 MG TOTAL) BY MOUTH EVERY OTHER DAY. 45 tablet 2  . nitroGLYCERIN (NITROSTAT) 0.4 MG SL tablet Place 1 tablet (0.4 mg total) under the tongue every 5 (five) minutes as needed for chest pain. 25 tablet 3  . Nutritional Supplements (FRUIT & VEGETABLE DAILY PO) Take 2 tablets daily by mouth.    Marland Kitchen  oxymetazoline (AFRIN) 0.05 % nasal spray Place 1 spray into both nostrils 2 (two) times daily as needed for congestion.    . Probiotic Product (ALIGN PO) Take 1 tablet by mouth daily.    . simvastatin (ZOCOR) 40 MG tablet TAKE 1 TABLET BY MOUTH EVERY DAY (Patient taking differently: Take 40 mg by mouth at bedtime. ) 90 tablet 0  . triamcinolone (NASACORT ALLERGY 24HR) 55 MCG/ACT AERO nasal inhaler Place 2 sprays into the nose at bedtime.     . triamcinolone ointment (KENALOG) 0.1 % Apply 1 application topically 3 (three) times daily as needed (Dry skin).       No current facility-administered medications for this visit.    Allergies  Allergen Reactions  . Penicillins Hives    In his 20's    Review of Systems   Patient had a large sebaceous cyst in the area of his lower sternum which was treated with incision and drainage several years ago.  It is now a scarred area without active infection or cellulitis. he is right-hand dominant. History of thoracic trauma or rib fractures No history of DVT or phlebitis.                    Review of Systems :  [ y ] = yes, [  ] = no        General :  Weight gain [   ]    Weight loss  [   ]  Fatigue Blue.Reese  ]  Fever [  ]  Chills  [  ]                                          HEENT    Headache [  ]  Dizziness [  ]  Blurred vision [  ] Glaucoma  [  ]                          Nosebleeds [  ] Painful or loose teeth [  ]        Cardiac :  Chest pain/ pressure [ y ]  Resting SOB [  ] exertional SOB [  ]                        Orthopnea [  ]  Pedal edema  [  ]  Palpitations [  ] Syncope/presyncope [ ]                         Paroxysmal nocturnal dyspnea [  ]         Pulmonary : cough [  ]  wheezing [  ]  Hemoptysis [  ] Sputum [  ] Snoring [  ]                              Pneumothorax [  ]  Sleep apnea [  ]        GI : Vomiting [  ]  Dysphagia [  ]  Melena  [  ]  Abdominal pain [  ] BRBPR [  ]              Heart burn [  ]  Constipation [  ] Diarrhea  [  ]  Colonoscopy [   ]        GU : Hematuria [  ]  Dysuria [  ]  Nocturia [  ] UTI's [  ]        Vascular : Claudication [  ]  Rest pain [  ]  DVT [  ] Vein stripping [  ] leg ulcers [  ]                          TIA [  ] Stroke [  ]  Varicose veins [  ]        NEURO :  Headaches  [  ] Seizures [  ] Vision changes [  ] Paresthesias [  ]                                               Musculoskeletal :  Arthritis [  ] Gout  [  ]  Back pain [  ]  Joint pain [  ]        Skin :  Rash [  ]  Melanoma [  ] Sores [  ]        Heme : Bleeding problems [   ]Clotting Disorders [  ] Anemia [  ]Blood Transfusion [ ]         Endocrine : Diabetes [  ] Heat or Cold intolerance [  ] Polyuria [  ]excessive thirst [ ]         Psych : Depression [  ]  Anxiety [  ]  Psych hospitalizations [  ] Memory change [  ]                                                                            BP 140/77   Pulse 64   Temp 97.7 F (36.5 C) (Skin)   Resp 20   Ht 6' (1.829 m)   Wt 217 lb (98.4 kg)   SpO2 98% Comment: RA  BMI 29.43 kg/m       Physical Exam  General: Well-nourished 74 year old male no acute distress HEENT: Normocephalic pupils equal , dentition adequate Neck: Supple without JVD, adenopathy, or bruit Chest: Clear to auscultation, symmetrical breath sounds, no rhonchi, no tenderness             or deformity.  Large scarred area of skin with keloid over lower sternal region. Cardiovascular: Regular rate and rhythm, no murmur, no gallop, peripheral pulses             palpable in all extremities Abdomen:  Soft, nontender, no palpable mass or organomegaly Extremities: Warm, well-perfused, no clubbing cyanosis edema or tenderness,              no venous stasis changes of the legs Rectal/GU: Deferred Neuro: Grossly non--focal and symmetrical throughout Skin: Clean and dry without rash or ulceration   Diagnostic Tests: Coronary angiogram and echocardiogram images reviewed showing normal LV function with significant left main and three-vessel CAD.  Impression: Class III exertional angina Hypertension  and dyslipidemia Three-vessel coronary artery disease and left main stenosis with LV function  Plan: Patient will be scheduled for multivessel CABG on Tuesday, June 8 at [facility].  I discussed the benefits of surgery as well as the alternatives and the potential risks of MI, stroke, bleeding, infection, organ failure and death.  He agrees to proceed with surgery.   Len Childs, MD [facility] [phone]

## 2019-09-28 NOTE — Pre-Procedure Instructions (Signed)
Your procedure is scheduled on Tuesday, October 04, 2019 from 07:30 AM- 1:31 PM.  Report to [facility] Main Entrance "A" at 05:30 A.M., and check in at the Admitting office.  Call this number if you have problems the morning of surgery:  [phone]  Call [phone] if you have any questions prior to your surgery date Monday-Friday 8am-4pm.    Remember:  Do not eat or drink after midnight the night before your surgery.   Take these medicines the morning of surgery with A SIP OF WATER: famotidine (PEPCID) finasteride (PROSCAR) flecainide (TAMBOCOR) metoprolol succinate (TOPROL-XL)   IF NEEDED: nitroGLYCERIN (NITROSTAT) oxymetazoline (AFRIN) nasal spray  Follow your surgeon's instructions on when to stop Aspirin.  If no instructions were given by your surgeon then you will need to call the office to get those instructions.     As of today, STOP taking any Aspirin (unless otherwise instructed by your surgeon) and Aspirin containing products, Aleve, Naproxen, Ibuprofen, Motrin, Advil, Goody's, BC's, all herbal medications, fish oil, and all vitamins.          The Morning of Surgery:            Do not wear jewelry.            Do not wear lotions, powders, colognes, or deodorant.            Men may shave face and neck.            Do not bring valuables to the hospital.            [facility] is not responsible for any belongings or valuables.  Do NOT Smoke (Tobacco/Vapping) or drink Alcohol 24 hours prior to your procedure.  If you use a CPAP at night, you may bring all equipment for your overnight stay.   Contacts, glasses, dentures or bridgework may not be worn into surgery.      For patients admitted to the hospital, discharge time will be determined by your treatment team.   Patients discharged the day of surgery will not be allowed to drive home, and someone needs to stay with them for 24 hours.    Special instructions:   [place]- Preparing For Surgery  Before  surgery, you can play an important role. Because skin is not sterile, your skin needs to be as free of germs as possible. You can reduce the number of germs on your skin by washing with CHG (chlorahexidine gluconate) Soap before surgery.  CHG is an antiseptic cleaner which kills germs and bonds with the skin to continue killing germs even after washing.    Oral Hygiene is also important to reduce your risk of infection.  Remember - BRUSH YOUR TEETH THE MORNING OF SURGERY WITH YOUR REGULAR TOOTHPASTE  Please do not use if you have an allergy to CHG or antibacterial soaps. If your skin becomes reddened/irritated stop using the CHG.  Do not shave (including legs and underarms) for at least 48 hours prior to first CHG shower. It is OK to shave your face.  Please follow these instructions carefully.   1. Shower the NIGHT BEFORE SURGERY and the MORNING OF SURGERY with CHG Soap.   2. If you chose to wash your hair, wash your hair first as usual with your normal shampoo.  3. After you shampoo, rinse your hair and body thoroughly to remove the shampoo.  4. Use CHG as you would any other liquid soap. You can apply CHG directly to  the skin and wash gently with a scrungie or a clean washcloth.   5. Apply the CHG Soap to your body ONLY FROM THE NECK DOWN.  Do not use on open wounds or open sores. Avoid contact with your eyes, ears, mouth and genitals (private parts). Wash Face and genitals (private parts)  with your normal soap.   6. Wash thoroughly, paying special attention to the area where your surgery will be performed.  7. Thoroughly rinse your body with warm water from the neck down.  8. DO NOT shower/wash with your normal soap after using and rinsing off the CHG Soap.  9. Pat yourself dry with a CLEAN TOWEL.  10. Wear CLEAN PAJAMAS to bed the night before surgery, wear comfortable clothes the morning of surgery  11. Place CLEAN SHEETS on your bed the night of your first shower and DO NOT SLEEP  WITH PETS.   Day of Surgery:   Do not apply any deodorants/lotions.  Please wear clean clothes to the hospital/surgery center.   Remember to brush your teeth WITH YOUR REGULAR TOOTHPASTE.   Please read over the following fact sheets that you were given.

## 2019-09-29 ENCOUNTER — Other Ambulatory Visit: Payer: Self-pay

## 2019-09-29 ENCOUNTER — Encounter (HOSPITAL_COMMUNITY): Payer: Self-pay

## 2019-09-29 ENCOUNTER — Encounter (HOSPITAL_COMMUNITY)
Admission: RE | Admit: 2019-09-29 | Discharge: 2019-09-29 | Disposition: A | Discharge status: Home / Self Care | Payer: Medicare Other | Source: Ambulatory Visit | Attending: Cardiothoracic Surgery | Admitting: Cardiothoracic Surgery

## 2019-09-29 DIAGNOSIS — Z01818 Encounter for other preprocedural examination: Secondary | ICD-10-CM | POA: Insufficient documentation

## 2019-09-29 DIAGNOSIS — I2510 Atherosclerotic heart disease of native coronary artery without angina pectoris: Secondary | ICD-10-CM

## 2019-09-29 HISTORY — DX: Cardiac arrhythmia, unspecified: I49.9

## 2019-09-29 HISTORY — DX: Dyspnea, unspecified: R06.00

## 2019-09-29 HISTORY — DX: Malignant (primary) neoplasm, unspecified: C80.1

## 2019-09-29 HISTORY — DX: Atherosclerotic heart disease of native coronary artery without angina pectoris: I25.10

## 2019-09-29 LAB — BLOOD GAS, ARTERIAL
Acid-base deficit: 1.3 mmol/L (ref 0.0–2.0)
Bicarbonate: 22.8 mmol/L (ref 20.0–28.0)
Drawn by: 421801
FIO2: 21
O2 Saturation: 98.5 %
Patient temperature: 37
pCO2 arterial: 37 mmHg (ref 32.0–48.0)
pH, Arterial: 7.405 (ref 7.350–7.450)
pO2, Arterial: 124 mmHg — ABNORMAL HIGH (ref 83.0–108.0)

## 2019-09-29 LAB — URINALYSIS, ROUTINE W REFLEX MICROSCOPIC
Bacteria, UA: NONE SEEN
Bilirubin Urine: NEGATIVE
Glucose, UA: NEGATIVE mg/dL
Ketones, ur: NEGATIVE mg/dL
Leukocytes,Ua: NEGATIVE
Nitrite: NEGATIVE
Protein, ur: NEGATIVE mg/dL
Specific Gravity, Urine: 1.006 (ref 1.005–1.030)
pH: 7 (ref 5.0–8.0)

## 2019-09-29 LAB — ABO/RH
ABO/RH(D): A POS
PT AG Type: NEGATIVE

## 2019-09-29 LAB — SURGICAL PCR SCREEN
MRSA, PCR: NEGATIVE
Staphylococcus aureus: NEGATIVE

## 2019-09-29 LAB — COMPREHENSIVE METABOLIC PANEL
ALT: 29 U/L (ref 0–44)
AST: 23 U/L (ref 15–41)
Albumin: 3.9 g/dL (ref 3.5–5.0)
Alkaline Phosphatase: 76 U/L (ref 38–126)
Anion gap: 9 (ref 5–15)
BUN: 11 mg/dL (ref 8–23)
CO2: 20 mmol/L — ABNORMAL LOW (ref 22–32)
Calcium: 9.1 mg/dL (ref 8.9–10.3)
Chloride: 107 mmol/L (ref 98–111)
Creatinine, Ser: 0.75 mg/dL (ref 0.61–1.24)
GFR calc Af Amer: >60 mL/min (ref >60)
GFR calc non Af Amer: >60 mL/min (ref >60)
Glucose, Bld: 92 mg/dL (ref 70–99)
Potassium: 4.4 mmol/L (ref 3.5–5.1)
Sodium: 136 mmol/L (ref 135–145)
Total Bilirubin: 0.9 mg/dL (ref 0.3–1.2)
Total Protein: 7.2 g/dL (ref 6.5–8.1)

## 2019-09-29 LAB — CBC
HCT: 47.7 % (ref 39.0–52.0)
Hemoglobin: 16 g/dL (ref 13.0–17.0)
MCH: 30.8 pg (ref 26.0–34.0)
MCHC: 33.5 g/dL (ref 30.0–36.0)
MCV: 91.9 fL (ref 80.0–100.0)
Platelets: 229 10*3/uL (ref 150–400)
RBC: 5.19 MIL/uL (ref 4.22–5.81)
RDW: 12 % (ref 11.5–15.5)
WBC: 9.1 10*3/uL (ref 4.0–10.5)
nRBC: 0 % (ref 0.0–0.2)

## 2019-09-29 LAB — HEMOGLOBIN A1C
Hgb A1c MFr Bld: 5.8 % — ABNORMAL HIGH (ref 4.8–5.6)
Mean Plasma Glucose: 119.76 mg/dL

## 2019-09-29 LAB — PROTIME-INR
INR: 1 (ref 0.8–1.2)
Prothrombin Time: 13 seconds (ref 11.4–15.2)

## 2019-09-29 LAB — APTT: aPTT: 33 seconds (ref 24–36)

## 2019-09-29 NOTE — Progress Notes (Signed)
PCP - Sharon Mt. Street, MD Cardiologist - Shirlee More, MD  PPM/ICD - Denies  Chest x-ray - 09/29/19 EKG - 09/20/19 Stress Test - 08/23/19 ECHO - 08/15/19 Cardiac Cath - 09/23/19  Sleep Study - Denies  Patient denies being a diabetic.  Blood Thinner Instructions: N/A Aspirin Instructions: Patient instructed to call Dr. Lucianne Lei Tright's office after this visit for ASA instructions.  ERAS Protcol -N/A  COVID TEST- 09/30/19   Anesthesia review: Yes, cardiac hx.  Patient denies shortness of breath, fever, cough and chest pain at PAT appointment   All instructions explained to the patient, with a verbal understanding of the material. Patient agrees to go over the instructions while at home for a better understanding. Patient also instructed to self quarantine after being tested for COVID-19. The opportunity to ask questions was provided.

## 2019-09-30 ENCOUNTER — Other Ambulatory Visit (HOSPITAL_COMMUNITY)
Admission: RE | Admit: 2019-09-30 | Discharge: 2019-09-30 | Disposition: A | Discharge status: Home / Self Care | Payer: Medicare Other | Source: Ambulatory Visit | Attending: Cardiothoracic Surgery | Admitting: Cardiothoracic Surgery

## 2019-09-30 ENCOUNTER — Ambulatory Visit (HOSPITAL_COMMUNITY)
Admission: RE | Admit: 2019-09-30 | Discharge: 2019-09-30 | Disposition: A | Discharge status: Home / Self Care | Payer: Medicare Other | Source: Ambulatory Visit | Attending: Cardiothoracic Surgery | Admitting: Cardiothoracic Surgery

## 2019-09-30 DIAGNOSIS — Z01818 Encounter for other preprocedural examination: Secondary | ICD-10-CM | POA: Diagnosis present

## 2019-09-30 DIAGNOSIS — I2510 Atherosclerotic heart disease of native coronary artery without angina pectoris: Secondary | ICD-10-CM | POA: Insufficient documentation

## 2019-09-30 DIAGNOSIS — Z951 Presence of aortocoronary bypass graft: Secondary | ICD-10-CM | POA: Diagnosis not present

## 2019-09-30 DIAGNOSIS — Z20822 Contact with and (suspected) exposure to covid-19: Secondary | ICD-10-CM | POA: Diagnosis not present

## 2019-09-30 NOTE — Progress Notes (Signed)
Pre-CABG testing has been completed. Preliminary results can be found in CV Proc through chart review.   09/30/19 3:38 PM Steve Frye RVT

## 2019-10-01 LAB — SARS CORONAVIRUS 2 (TAT 6-24 HRS): SARS Coronavirus 2: NEGATIVE

## 2019-10-03 MED ORDER — SODIUM CHLORIDE 0.9 % IV SOLN
750.0000 mg | INTRAVENOUS | Status: AC
  Administered 2019-10-04: 750 mg via INTRAVENOUS
  Filled 2019-10-03: qty 750

## 2019-10-03 MED ORDER — EPINEPHRINE HCL 5 MG/250ML IV SOLN IN NS
0.0000 ug/min | INTRAVENOUS | Status: DC
  Filled 2019-10-03: qty 250

## 2019-10-03 MED ORDER — VANCOMYCIN HCL 1500 MG/300ML IV SOLN
1500.0000 mg | INTRAVENOUS | Status: AC
  Administered 2019-10-04: 1500 mg via INTRAVENOUS
  Filled 2019-10-03: qty 300

## 2019-10-03 MED ORDER — SODIUM CHLORIDE 0.9 % IV SOLN
1.5000 g | INTRAVENOUS | Status: AC
  Administered 2019-10-04: 1.5 g via INTRAVENOUS
  Filled 2019-10-03 (×2): qty 1.5

## 2019-10-03 MED ORDER — PHENYLEPHRINE HCL-NACL 20-0.9 MG/250ML-% IV SOLN
30.0000 ug/min | INTRAVENOUS | Status: AC
  Administered 2019-10-04: 20 ug/min via INTRAVENOUS
  Filled 2019-10-03: qty 250

## 2019-10-03 MED ORDER — MAGNESIUM SULFATE 50 % IJ SOLN
40.0000 meq | INTRAMUSCULAR | Status: DC
  Filled 2019-10-03: qty 9.85

## 2019-10-03 MED ORDER — MILRINONE LACTATE IN DEXTROSE 20-5 MG/100ML-% IV SOLN
0.3000 ug/kg/min | INTRAVENOUS | Status: DC
  Filled 2019-10-03: qty 100

## 2019-10-03 MED ORDER — DEXMEDETOMIDINE HCL IN NACL 400 MCG/100ML IV SOLN
0.1000 ug/kg/h | INTRAVENOUS | Status: AC
  Administered 2019-10-04: .5 ug/kg/h via INTRAVENOUS
  Filled 2019-10-03: qty 100

## 2019-10-03 MED ORDER — POTASSIUM CHLORIDE 2 MEQ/ML IV SOLN
80.0000 meq | INTRAVENOUS | Status: DC
  Filled 2019-10-03: qty 40

## 2019-10-03 MED ORDER — SODIUM CHLORIDE 0.9 % IV SOLN
INTRAVENOUS | Status: DC
  Filled 2019-10-03: qty 30

## 2019-10-03 MED ORDER — TRANEXAMIC ACID (OHS) PUMP PRIME SOLUTION
2.0000 mg/kg | INTRAVENOUS | Status: DC
  Filled 2019-10-03: qty 1.94

## 2019-10-03 MED ORDER — TRANEXAMIC ACID (OHS) BOLUS VIA INFUSION
15.0000 mg/kg | INTRAVENOUS | Status: AC
  Administered 2019-10-04: 1453.5 mg via INTRAVENOUS
  Filled 2019-10-03: qty 1454

## 2019-10-03 MED ORDER — NOREPINEPHRINE 4 MG/250ML-% IV SOLN
0.0000 ug/min | INTRAVENOUS | Status: DC
  Filled 2019-10-03: qty 250

## 2019-10-03 MED ORDER — NITROGLYCERIN IN D5W 200-5 MCG/ML-% IV SOLN
2.0000 ug/min | INTRAVENOUS | Status: DC
  Filled 2019-10-03: qty 250

## 2019-10-03 MED ORDER — PLASMA-LYTE 148 IV SOLN
INTRAVENOUS | Status: DC
  Filled 2019-10-03: qty 2.5

## 2019-10-03 MED ORDER — INSULIN REGULAR(HUMAN) IN NACL 100-0.9 UT/100ML-% IV SOLN
INTRAVENOUS | Status: AC
  Administered 2019-10-04: 1 [IU]/h via INTRAVENOUS
  Filled 2019-10-03: qty 100

## 2019-10-03 MED ORDER — TRANEXAMIC ACID 1000 MG/10ML IV SOLN
1.5000 mg/kg/h | INTRAVENOUS | Status: AC
  Administered 2019-10-04: 1.5 mg/kg/h via INTRAVENOUS
  Filled 2019-10-03: qty 25

## 2019-10-04 ENCOUNTER — Inpatient Hospital Stay (HOSPITAL_COMMUNITY): Payer: Medicare Other | Admitting: Physician Assistant

## 2019-10-04 ENCOUNTER — Inpatient Hospital Stay (HOSPITAL_COMMUNITY)
Admission: RE | Admit: 2019-10-04 | Discharge: 2019-10-12 | DRG: 236 | Disposition: A | Discharge status: Home / Self Care | Payer: Medicare Other | Attending: Cardiothoracic Surgery | Admitting: Cardiothoracic Surgery

## 2019-10-04 ENCOUNTER — Inpatient Hospital Stay (HOSPITAL_COMMUNITY): Payer: Medicare Other

## 2019-10-04 ENCOUNTER — Encounter (HOSPITAL_COMMUNITY): Payer: Self-pay | Admitting: Cardiothoracic Surgery

## 2019-10-04 ENCOUNTER — Inpatient Hospital Stay (HOSPITAL_COMMUNITY)
Admission: RE | Disposition: A | Discharge status: Home / Self Care | Payer: Self-pay | Source: Home / Self Care | Attending: Cardiothoracic Surgery

## 2019-10-04 ENCOUNTER — Other Ambulatory Visit: Payer: Self-pay

## 2019-10-04 ENCOUNTER — Inpatient Hospital Stay (HOSPITAL_COMMUNITY): Payer: Medicare Other | Admitting: Certified Registered Nurse Anesthetist

## 2019-10-04 DIAGNOSIS — Z87891 Personal history of nicotine dependence: Secondary | ICD-10-CM | POA: Diagnosis not present

## 2019-10-04 DIAGNOSIS — I471 Supraventricular tachycardia: Secondary | ICD-10-CM | POA: Diagnosis not present

## 2019-10-04 DIAGNOSIS — Z8249 Family history of ischemic heart disease and other diseases of the circulatory system: Secondary | ICD-10-CM

## 2019-10-04 DIAGNOSIS — I25118 Atherosclerotic heart disease of native coronary artery with other forms of angina pectoris: Secondary | ICD-10-CM | POA: Diagnosis present

## 2019-10-04 DIAGNOSIS — K219 Gastro-esophageal reflux disease without esophagitis: Secondary | ICD-10-CM | POA: Diagnosis present

## 2019-10-04 DIAGNOSIS — Z79899 Other long term (current) drug therapy: Secondary | ICD-10-CM | POA: Diagnosis not present

## 2019-10-04 DIAGNOSIS — Z9049 Acquired absence of other specified parts of digestive tract: Secondary | ICD-10-CM

## 2019-10-04 DIAGNOSIS — D62 Acute posthemorrhagic anemia: Secondary | ICD-10-CM | POA: Diagnosis not present

## 2019-10-04 DIAGNOSIS — Z85828 Personal history of other malignant neoplasm of skin: Secondary | ICD-10-CM

## 2019-10-04 DIAGNOSIS — K567 Ileus, unspecified: Secondary | ICD-10-CM | POA: Diagnosis not present

## 2019-10-04 DIAGNOSIS — Z7982 Long term (current) use of aspirin: Secondary | ICD-10-CM

## 2019-10-04 DIAGNOSIS — I2510 Atherosclerotic heart disease of native coronary artery without angina pectoris: Secondary | ICD-10-CM

## 2019-10-04 DIAGNOSIS — E876 Hypokalemia: Secondary | ICD-10-CM | POA: Diagnosis not present

## 2019-10-04 DIAGNOSIS — R42 Dizziness and giddiness: Secondary | ICD-10-CM | POA: Diagnosis not present

## 2019-10-04 DIAGNOSIS — Z951 Presence of aortocoronary bypass graft: Secondary | ICD-10-CM

## 2019-10-04 DIAGNOSIS — M1990 Unspecified osteoarthritis, unspecified site: Secondary | ICD-10-CM | POA: Diagnosis present

## 2019-10-04 DIAGNOSIS — I119 Hypertensive heart disease without heart failure: Secondary | ICD-10-CM | POA: Diagnosis present

## 2019-10-04 DIAGNOSIS — I252 Old myocardial infarction: Secondary | ICD-10-CM

## 2019-10-04 DIAGNOSIS — L910 Hypertrophic scar: Secondary | ICD-10-CM | POA: Diagnosis present

## 2019-10-04 DIAGNOSIS — E8770 Fluid overload, unspecified: Secondary | ICD-10-CM | POA: Diagnosis not present

## 2019-10-04 DIAGNOSIS — E785 Hyperlipidemia, unspecified: Secondary | ICD-10-CM | POA: Diagnosis present

## 2019-10-04 DIAGNOSIS — I480 Paroxysmal atrial fibrillation: Secondary | ICD-10-CM | POA: Diagnosis present

## 2019-10-04 DIAGNOSIS — R221 Localized swelling, mass and lump, neck: Secondary | ICD-10-CM | POA: Diagnosis not present

## 2019-10-04 DIAGNOSIS — Z6830 Body mass index (BMI) 30.0-30.9, adult: Secondary | ICD-10-CM

## 2019-10-04 DIAGNOSIS — E669 Obesity, unspecified: Secondary | ICD-10-CM | POA: Diagnosis present

## 2019-10-04 DIAGNOSIS — R1310 Dysphagia, unspecified: Secondary | ICD-10-CM | POA: Diagnosis not present

## 2019-10-04 DIAGNOSIS — Z419 Encounter for procedure for purposes other than remedying health state, unspecified: Secondary | ICD-10-CM

## 2019-10-04 HISTORY — PX: ENDOVEIN HARVEST OF GREATER SAPHENOUS VEIN: SHX5059

## 2019-10-04 HISTORY — DX: Presence of aortocoronary bypass graft: Z95.1

## 2019-10-04 HISTORY — PX: EXCISION OF KELOID: SHX6267

## 2019-10-04 HISTORY — PX: CORONARY ARTERY BYPASS GRAFT: SHX141

## 2019-10-04 HISTORY — PX: TEE WITHOUT CARDIOVERSION: SHX5443

## 2019-10-04 LAB — POCT I-STAT 7, (LYTES, BLD GAS, ICA,H+H)
Acid-base deficit: 1 mmol/L (ref 0.0–2.0)
Acid-base deficit: 3 mmol/L — ABNORMAL HIGH (ref 0.0–2.0)
Acid-base deficit: 6 mmol/L — ABNORMAL HIGH (ref 0.0–2.0)
Bicarbonate: 23 mmol/L (ref 20.0–28.0)
Bicarbonate: 22.6 mmol/L (ref 20.0–28.0)
Bicarbonate: 18.9 mmol/L — ABNORMAL LOW (ref 20.0–28.0)
Calcium, Ion: 1.08 mmol/L — ABNORMAL LOW (ref 1.15–1.40)
Calcium, Ion: 1.13 mmol/L — ABNORMAL LOW (ref 1.15–1.40)
Calcium, Ion: 1.06 mmol/L — ABNORMAL LOW (ref 1.15–1.40)
HCT: 34 % — ABNORMAL LOW (ref 39.0–52.0)
HCT: 30 % — ABNORMAL LOW (ref 39.0–52.0)
HCT: 25 % — ABNORMAL LOW (ref 39.0–52.0)
Hemoglobin: 11.6 g/dL — ABNORMAL LOW (ref 13.0–17.0)
Hemoglobin: 10.2 g/dL — ABNORMAL LOW (ref 13.0–17.0)
Hemoglobin: 8.5 g/dL — ABNORMAL LOW (ref 13.0–17.0)
O2 Saturation: 100 %
O2 Saturation: 100 %
O2 Saturation: 94 %
Patient temperature: 36.4
Patient temperature: 37
Patient temperature: 37.3
Potassium: 4.1 mmol/L (ref 3.5–5.1)
Potassium: 3.8 mmol/L (ref 3.5–5.1)
Potassium: 3.3 mmol/L — ABNORMAL LOW (ref 3.5–5.1)
Sodium: 141 mmol/L (ref 135–145)
Sodium: 140 mmol/L (ref 135–145)
Sodium: 143 mmol/L (ref 135–145)
TCO2: 24 mmol/L (ref 22–32)
TCO2: 24 mmol/L (ref 22–32)
TCO2: 20 mmol/L — ABNORMAL LOW (ref 22–32)
pCO2 arterial: 36 mmHg (ref 32.0–48.0)
pCO2 arterial: 40 mmHg (ref 32.0–48.0)
pCO2 arterial: 33.9 mmHg (ref 32.0–48.0)
pH, Arterial: 7.411 (ref 7.350–7.450)
pH, Arterial: 7.359 (ref 7.350–7.450)
pH, Arterial: 7.357 (ref 7.350–7.450)
pO2, Arterial: 181 mmHg — ABNORMAL HIGH (ref 83.0–108.0)
pO2, Arterial: 174 mmHg — ABNORMAL HIGH (ref 83.0–108.0)
pO2, Arterial: 75 mmHg — ABNORMAL LOW (ref 83.0–108.0)

## 2019-10-04 LAB — GLUCOSE, CAPILLARY
Glucose-Capillary: 141 mg/dL — ABNORMAL HIGH (ref 70–99)
Glucose-Capillary: 132 mg/dL — ABNORMAL HIGH (ref 70–99)
Glucose-Capillary: 138 mg/dL — ABNORMAL HIGH (ref 70–99)
Glucose-Capillary: 120 mg/dL — ABNORMAL HIGH (ref 70–99)
Glucose-Capillary: 121 mg/dL — ABNORMAL HIGH (ref 70–99)
Glucose-Capillary: 133 mg/dL — ABNORMAL HIGH (ref 70–99)
Glucose-Capillary: 123 mg/dL — ABNORMAL HIGH (ref 70–99)
Glucose-Capillary: 45 mg/dL — ABNORMAL LOW (ref 70–99)
Glucose-Capillary: 84 mg/dL (ref 70–99)

## 2019-10-04 LAB — BASIC METABOLIC PANEL
Anion gap: 5 (ref 5–15)
BUN: 8 mg/dL (ref 8–23)
CO2: 20 mmol/L — ABNORMAL LOW (ref 22–32)
Calcium: 7.2 mg/dL — ABNORMAL LOW (ref 8.9–10.3)
Chloride: 111 mmol/L (ref 98–111)
Creatinine, Ser: 0.72 mg/dL (ref 0.61–1.24)
GFR calc Af Amer: >60 mL/min (ref >60)
GFR calc non Af Amer: >60 mL/min (ref >60)
Glucose, Bld: 171 mg/dL — ABNORMAL HIGH (ref 70–99)
Potassium: 3.8 mmol/L (ref 3.5–5.1)
Sodium: 136 mmol/L (ref 135–145)

## 2019-10-04 LAB — CBC
HCT: 35.7 % — ABNORMAL LOW (ref 39.0–52.0)
HCT: 31 % — ABNORMAL LOW (ref 39.0–52.0)
Hemoglobin: 12.3 g/dL — ABNORMAL LOW (ref 13.0–17.0)
Hemoglobin: 10.7 g/dL — ABNORMAL LOW (ref 13.0–17.0)
MCH: 30.6 pg (ref 26.0–34.0)
MCH: 30.7 pg (ref 26.0–34.0)
MCHC: 34.5 g/dL (ref 30.0–36.0)
MCHC: 34.5 g/dL (ref 30.0–36.0)
MCV: 88.8 fL (ref 80.0–100.0)
MCV: 89.1 fL (ref 80.0–100.0)
Platelets: 153 10*3/uL (ref 150–400)
Platelets: 121 10*3/uL — ABNORMAL LOW (ref 150–400)
RBC: 4.02 MIL/uL — ABNORMAL LOW (ref 4.22–5.81)
RBC: 3.48 MIL/uL — ABNORMAL LOW (ref 4.22–5.81)
RDW: 11.9 % (ref 11.5–15.5)
RDW: 11.9 % (ref 11.5–15.5)
WBC: 14.3 10*3/uL — ABNORMAL HIGH (ref 4.0–10.5)
WBC: 10 10*3/uL (ref 4.0–10.5)
nRBC: 0 % (ref 0.0–0.2)
nRBC: 0 % (ref 0.0–0.2)

## 2019-10-04 LAB — HEMOGLOBIN AND HEMATOCRIT, BLOOD
HCT: 29.8 % — ABNORMAL LOW (ref 39.0–52.0)
Hemoglobin: 10.3 g/dL — ABNORMAL LOW (ref 13.0–17.0)

## 2019-10-04 LAB — PLATELET COUNT: Platelets: 155 K/uL (ref 150–400)

## 2019-10-04 LAB — ECHO INTRAOPERATIVE TEE
Height: 72 in
Weight: 3417.59 oz

## 2019-10-04 LAB — TYPE AND SCREEN
ABO/RH(D): A POS
Antibody Screen: NEGATIVE

## 2019-10-04 LAB — APTT: aPTT: 31 seconds (ref 24–36)

## 2019-10-04 LAB — MAGNESIUM: Magnesium: 2.5 mg/dL — ABNORMAL HIGH (ref 1.7–2.4)

## 2019-10-04 LAB — PROTIME-INR
INR: 1.4 — ABNORMAL HIGH (ref 0.8–1.2)
Prothrombin Time: 16.9 seconds — ABNORMAL HIGH (ref 11.4–15.2)

## 2019-10-04 SURGERY — CORONARY ARTERY BYPASS GRAFTING (CABG)
Anesthesia: General | Site: Chest | Laterality: Right

## 2019-10-04 MED ORDER — TRAMADOL HCL 50 MG PO TABS: 50.0000 mg | ORAL_TABLET | ORAL | Status: DC | PRN

## 2019-10-04 MED ORDER — DEXMEDETOMIDINE HCL IN NACL 400 MCG/100ML IV SOLN
0.0000 ug/kg/h | INTRAVENOUS | Status: DC
  Filled 2019-10-04: qty 100

## 2019-10-04 MED ORDER — VANCOMYCIN HCL IN DEXTROSE 1-5 GM/200ML-% IV SOLN
1000.0000 mg | Freq: Once | INTRAVENOUS | Status: AC
  Administered 2019-10-04: 1000 mg via INTRAVENOUS
  Filled 2019-10-04: qty 200

## 2019-10-04 MED ORDER — ORAL CARE MOUTH RINSE
15.0000 mL | Freq: Two times a day (BID) | OROMUCOSAL | Status: DC
  Administered 2019-10-04 – 2019-10-11 (×8): 15 mL via OROMUCOSAL

## 2019-10-04 MED ORDER — CHLORHEXIDINE GLUCONATE 0.12% ORAL RINSE (MEDLINE KIT)
15.0000 mL | Freq: Two times a day (BID) | OROMUCOSAL | Status: DC
  Administered 2019-10-04 – 2019-10-08 (×6): 15 mL via OROMUCOSAL

## 2019-10-04 MED ORDER — SODIUM CHLORIDE (PF) 0.9 % IJ SOLN: OROMUCOSAL | Status: DC | PRN

## 2019-10-04 MED ORDER — PROPOFOL 10 MG/ML IV BOLUS
INTRAVENOUS | Status: DC | PRN
  Administered 2019-10-04: 80 mg via INTRAVENOUS

## 2019-10-04 MED ORDER — HEPARIN SODIUM (PORCINE) 1000 UNIT/ML IJ SOLN
INTRAMUSCULAR | Status: AC
  Filled 2019-10-04: qty 1

## 2019-10-04 MED ORDER — METOPROLOL TARTRATE 12.5 MG HALF TABLET: 12.5000 mg | ORAL_TABLET | Freq: Once | ORAL | Status: DC

## 2019-10-04 MED ORDER — EPHEDRINE 5 MG/ML INJ
INTRAVENOUS | Status: AC
  Filled 2019-10-04: qty 10

## 2019-10-04 MED ORDER — SODIUM CHLORIDE 0.9 % IV SOLN: INTRAVENOUS | Status: DC | PRN

## 2019-10-04 MED ORDER — METOCLOPRAMIDE HCL 5 MG/ML IJ SOLN
10.0000 mg | Freq: Four times a day (QID) | INTRAMUSCULAR | Status: DC
  Administered 2019-10-04 – 2019-10-10 (×22): 10 mg via INTRAVENOUS
  Filled 2019-10-04 (×23): qty 2

## 2019-10-04 MED ORDER — ROCURONIUM BROMIDE 10 MG/ML (PF) SYRINGE
PREFILLED_SYRINGE | INTRAVENOUS | Status: AC
  Filled 2019-10-04: qty 20

## 2019-10-04 MED ORDER — PROTAMINE SULFATE 10 MG/ML IV SOLN
INTRAVENOUS | Status: AC
  Filled 2019-10-04: qty 10

## 2019-10-04 MED ORDER — PROTAMINE SULFATE 10 MG/ML IV SOLN
INTRAVENOUS | Status: DC | PRN
  Administered 2019-10-04: 50 mg via INTRAVENOUS
  Administered 2019-10-04: 300 mg via INTRAVENOUS

## 2019-10-04 MED ORDER — MIDAZOLAM HCL 5 MG/5ML IJ SOLN
INTRAMUSCULAR | Status: DC | PRN
  Administered 2019-10-04: 3 mg via INTRAVENOUS
  Administered 2019-10-04: 5 mg via INTRAVENOUS
  Administered 2019-10-04: 2 mg via INTRAVENOUS

## 2019-10-04 MED ORDER — SODIUM CHLORIDE 0.9 % IV SOLN
20.0000 ug | Freq: Once | INTRAVENOUS | Status: AC
  Administered 2019-10-04: 20 ug via INTRAVENOUS
  Filled 2019-10-04: qty 5

## 2019-10-04 MED ORDER — LACTATED RINGERS IV SOLN: INTRAVENOUS | Status: DC

## 2019-10-04 MED ORDER — MIDAZOLAM HCL 2 MG/2ML IJ SOLN: 2.0000 mg | INTRAMUSCULAR | Status: DC | PRN

## 2019-10-04 MED ORDER — NITROGLYCERIN IN D5W 200-5 MCG/ML-% IV SOLN: 0.0000 ug/min | INTRAVENOUS | Status: DC

## 2019-10-04 MED ORDER — HEMOSTATIC AGENTS (NO CHARGE) OPTIME
TOPICAL | Status: DC | PRN
  Administered 2019-10-04: 1 via TOPICAL

## 2019-10-04 MED ORDER — MIDAZOLAM HCL (PF) 10 MG/2ML IJ SOLN
INTRAMUSCULAR | Status: AC
  Filled 2019-10-04: qty 2

## 2019-10-04 MED ORDER — BISACODYL 5 MG PO TBEC
10.0000 mg | DELAYED_RELEASE_TABLET | Freq: Every day | ORAL | Status: DC
  Administered 2019-10-05 – 2019-10-09 (×5): 10 mg via ORAL
  Filled 2019-10-04 (×7): qty 2

## 2019-10-04 MED ORDER — DEXTROSE 50 % IV SOLN: 0.0000 mL | INTRAVENOUS | Status: DC | PRN

## 2019-10-04 MED ORDER — ACETAMINOPHEN 500 MG PO TABS
1000.0000 mg | ORAL_TABLET | Freq: Four times a day (QID) | ORAL | Status: AC
  Administered 2019-10-05 – 2019-10-09 (×14): 1000 mg via ORAL
  Filled 2019-10-04 (×18): qty 2

## 2019-10-04 MED ORDER — FENTANYL CITRATE (PF) 250 MCG/5ML IJ SOLN
INTRAMUSCULAR | Status: DC | PRN
  Administered 2019-10-04: 100 ug via INTRAVENOUS
  Administered 2019-10-04: 50 ug via INTRAVENOUS
  Administered 2019-10-04: 150 ug via INTRAVENOUS
  Administered 2019-10-04: 100 ug via INTRAVENOUS
  Administered 2019-10-04 (×2): 50 ug via INTRAVENOUS
  Administered 2019-10-04: 250 ug via INTRAVENOUS
  Administered 2019-10-04: 100 ug via INTRAVENOUS
  Administered 2019-10-04: 250 ug via INTRAVENOUS
  Administered 2019-10-04: 50 ug via INTRAVENOUS
  Administered 2019-10-04: 100 ug via INTRAVENOUS

## 2019-10-04 MED ORDER — ALBUMIN HUMAN 5 % IV SOLN: INTRAVENOUS | Status: DC | PRN

## 2019-10-04 MED ORDER — SODIUM CHLORIDE 0.9 % IV SOLN: 250.0000 mL | INTRAVENOUS | Status: DC

## 2019-10-04 MED ORDER — OXYCODONE HCL 5 MG PO TABS
5.0000 mg | ORAL_TABLET | ORAL | Status: DC | PRN
  Administered 2019-10-05 – 2019-10-06 (×5): 10 mg via ORAL
  Administered 2019-10-11: 5 mg via ORAL
  Filled 2019-10-04: qty 1
  Filled 2019-10-04 (×5): qty 2

## 2019-10-04 MED ORDER — AMIODARONE HCL 200 MG PO TABS
200.0000 mg | ORAL_TABLET | Freq: Two times a day (BID) | ORAL | Status: DC
  Administered 2019-10-05 – 2019-10-06 (×3): 200 mg via ORAL
  Filled 2019-10-04 (×3): qty 1

## 2019-10-04 MED ORDER — CHLORHEXIDINE GLUCONATE 4 % EX LIQD: 30.0000 mL | CUTANEOUS | Status: DC

## 2019-10-04 MED ORDER — PANTOPRAZOLE SODIUM 40 MG PO TBEC
40.0000 mg | DELAYED_RELEASE_TABLET | Freq: Every day | ORAL | Status: DC
  Administered 2019-10-06 – 2019-10-12 (×7): 40 mg via ORAL
  Filled 2019-10-04 (×8): qty 1

## 2019-10-04 MED ORDER — ACETAMINOPHEN 650 MG RE SUPP
650.0000 mg | Freq: Once | RECTAL | Status: AC
  Administered 2019-10-04: 650 mg via RECTAL

## 2019-10-04 MED ORDER — ROCURONIUM BROMIDE 10 MG/ML (PF) SYRINGE
PREFILLED_SYRINGE | INTRAVENOUS | Status: DC | PRN
  Administered 2019-10-04: 40 mg via INTRAVENOUS
  Administered 2019-10-04 (×2): 50 mg via INTRAVENOUS
  Administered 2019-10-04: 60 mg via INTRAVENOUS

## 2019-10-04 MED ORDER — CHLORHEXIDINE GLUCONATE 0.12 % MT SOLN
15.0000 mL | Freq: Once | OROMUCOSAL | Status: AC
  Administered 2019-10-04: 15 mL via OROMUCOSAL
  Filled 2019-10-04: qty 15

## 2019-10-04 MED ORDER — SODIUM CHLORIDE 0.9% FLUSH: 3.0000 mL | INTRAVENOUS | Status: DC | PRN

## 2019-10-04 MED ORDER — METOPROLOL TARTRATE 25 MG/10 ML ORAL SUSPENSION
12.5000 mg | Freq: Two times a day (BID) | ORAL | Status: DC
  Filled 2019-10-04: qty 5

## 2019-10-04 MED ORDER — ORAL CARE MOUTH RINSE
15.0000 mL | OROMUCOSAL | Status: DC
  Administered 2019-10-04: 15 mL via OROMUCOSAL

## 2019-10-04 MED ORDER — FENTANYL CITRATE (PF) 250 MCG/5ML IJ SOLN
INTRAMUSCULAR | Status: AC
  Filled 2019-10-04: qty 25

## 2019-10-04 MED ORDER — PROPOFOL 10 MG/ML IV BOLUS
INTRAVENOUS | Status: AC
  Filled 2019-10-04: qty 20

## 2019-10-04 MED ORDER — SODIUM CHLORIDE 0.9% FLUSH
3.0000 mL | Freq: Two times a day (BID) | INTRAVENOUS | Status: DC
  Administered 2019-10-05 – 2019-10-11 (×7): 3 mL via INTRAVENOUS

## 2019-10-04 MED ORDER — CHLORHEXIDINE GLUCONATE 0.12 % MT SOLN
15.0000 mL | OROMUCOSAL | Status: AC
  Administered 2019-10-04: 15 mL via OROMUCOSAL

## 2019-10-04 MED ORDER — ACETAMINOPHEN 160 MG/5ML PO SOLN
1000.0000 mg | Freq: Four times a day (QID) | ORAL | Status: AC
  Administered 2019-10-04: 1000 mg
  Filled 2019-10-04 (×2): qty 40.6

## 2019-10-04 MED ORDER — PLASMA-LYTE 148 IV SOLN: INTRAVENOUS | Status: DC | PRN

## 2019-10-04 MED ORDER — ASPIRIN EC 325 MG PO TBEC
325.0000 mg | DELAYED_RELEASE_TABLET | Freq: Every day | ORAL | Status: DC
  Administered 2019-10-05 – 2019-10-10 (×6): 325 mg via ORAL
  Filled 2019-10-04 (×6): qty 1

## 2019-10-04 MED ORDER — LACTATED RINGERS IV SOLN: 500.0000 mL | Freq: Once | INTRAVENOUS | Status: DC | PRN

## 2019-10-04 MED ORDER — ONDANSETRON HCL 4 MG/2ML IJ SOLN
4.0000 mg | Freq: Four times a day (QID) | INTRAMUSCULAR | Status: DC | PRN
  Administered 2019-10-05 – 2019-10-08 (×3): 4 mg via INTRAVENOUS
  Filled 2019-10-04 (×3): qty 2

## 2019-10-04 MED ORDER — PROTAMINE SULFATE 10 MG/ML IV SOLN
INTRAVENOUS | Status: AC
  Filled 2019-10-04: qty 25

## 2019-10-04 MED ORDER — SIMVASTATIN 20 MG PO TABS
40.0000 mg | ORAL_TABLET | Freq: Every day | ORAL | Status: DC
  Administered 2019-10-05 – 2019-10-12 (×8): 40 mg via ORAL
  Filled 2019-10-04 (×8): qty 2

## 2019-10-04 MED ORDER — DOCUSATE SODIUM 100 MG PO CAPS
200.0000 mg | ORAL_CAPSULE | Freq: Every day | ORAL | Status: DC
  Administered 2019-10-05 – 2019-10-12 (×7): 200 mg via ORAL
  Filled 2019-10-04 (×8): qty 2

## 2019-10-04 MED ORDER — LACTATED RINGERS IV SOLN
INTRAVENOUS | Status: DC | PRN
Start: 2019-10-04 — End: 2019-10-04

## 2019-10-04 MED ORDER — METOPROLOL TARTRATE 12.5 MG HALF TABLET
12.5000 mg | ORAL_TABLET | Freq: Two times a day (BID) | ORAL | Status: DC
  Administered 2019-10-05 – 2019-10-06 (×3): 12.5 mg via ORAL
  Filled 2019-10-04 (×4): qty 1

## 2019-10-04 MED ORDER — ASPIRIN 81 MG PO CHEW: 324.0000 mg | CHEWABLE_TABLET | Freq: Every day | ORAL | Status: DC

## 2019-10-04 MED ORDER — FAMOTIDINE IN NACL 20-0.9 MG/50ML-% IV SOLN
20.0000 mg | Freq: Two times a day (BID) | INTRAVENOUS | Status: DC
  Administered 2019-10-04: 20 mg via INTRAVENOUS
  Filled 2019-10-04 (×2): qty 50

## 2019-10-04 MED ORDER — SODIUM CHLORIDE 0.9 % IV SOLN: INTRAVENOUS | Status: DC

## 2019-10-04 MED ORDER — SODIUM CHLORIDE 0.9% IV SOLUTION: Freq: Once | INTRAVENOUS | Status: DC

## 2019-10-04 MED ORDER — LEVOFLOXACIN IN D5W 750 MG/150ML IV SOLN
750.0000 mg | INTRAVENOUS | Status: AC
  Administered 2019-10-05: 750 mg via INTRAVENOUS
  Filled 2019-10-04: qty 150

## 2019-10-04 MED ORDER — MAGNESIUM SULFATE 4 GM/100ML IV SOLN
4.0000 g | Freq: Once | INTRAVENOUS | Status: AC
  Administered 2019-10-04: 4 g via INTRAVENOUS
  Filled 2019-10-04: qty 100

## 2019-10-04 MED ORDER — SODIUM CHLORIDE 0.45 % IV SOLN: INTRAVENOUS | Status: DC | PRN

## 2019-10-04 MED ORDER — PHENYLEPHRINE HCL-NACL 20-0.9 MG/250ML-% IV SOLN: 0.0000 ug/min | INTRAVENOUS | Status: DC

## 2019-10-04 MED ORDER — ALBUMIN HUMAN 5 % IV SOLN
250.0000 mL | INTRAVENOUS | Status: AC | PRN
  Administered 2019-10-04 (×5): 12.5 g via INTRAVENOUS
  Filled 2019-10-04 (×2): qty 250

## 2019-10-04 MED ORDER — CHLORHEXIDINE GLUCONATE CLOTH 2 % EX PADS
6.0000 | MEDICATED_PAD | Freq: Every day | CUTANEOUS | Status: DC
  Administered 2019-10-05 – 2019-10-06 (×2): 6 via TOPICAL

## 2019-10-04 MED ORDER — INSULIN REGULAR(HUMAN) IN NACL 100-0.9 UT/100ML-% IV SOLN: INTRAVENOUS | Status: DC

## 2019-10-04 MED ORDER — MORPHINE SULFATE (PF) 2 MG/ML IV SOLN
1.0000 mg | INTRAVENOUS | Status: DC | PRN
  Administered 2019-10-04 (×2): 2 mg via INTRAVENOUS
  Filled 2019-10-04: qty 2

## 2019-10-04 MED ORDER — 0.9 % SODIUM CHLORIDE (POUR BTL) OPTIME
TOPICAL | Status: DC | PRN
  Administered 2019-10-04 (×6): 1000 mL

## 2019-10-04 MED ORDER — POTASSIUM CHLORIDE 10 MEQ/50ML IV SOLN: 10.0000 meq | INTRAVENOUS | Status: AC

## 2019-10-04 MED ORDER — BISACODYL 10 MG RE SUPP: 10.0000 mg | Freq: Every day | RECTAL | Status: DC

## 2019-10-04 MED ORDER — HEPARIN SODIUM (PORCINE) 1000 UNIT/ML IJ SOLN
INTRAMUSCULAR | Status: DC | PRN
  Administered 2019-10-04: 3000 [IU] via INTRAVENOUS
  Administered 2019-10-04: 30000 [IU] via INTRAVENOUS

## 2019-10-04 MED ORDER — ACETAMINOPHEN 160 MG/5ML PO SOLN: 650.0000 mg | Freq: Once | ORAL | Status: AC

## 2019-10-04 MED ORDER — PHENYLEPHRINE 40 MCG/ML (10ML) SYRINGE FOR IV PUSH (FOR BLOOD PRESSURE SUPPORT)
PREFILLED_SYRINGE | INTRAVENOUS | Status: AC
  Filled 2019-10-04: qty 10

## 2019-10-04 MED ORDER — METOPROLOL TARTRATE 5 MG/5ML IV SOLN
2.5000 mg | INTRAVENOUS | Status: DC | PRN
  Administered 2019-10-06 – 2019-10-08 (×4): 5 mg via INTRAVENOUS
  Administered 2019-10-08: 2.5 mg via INTRAVENOUS
  Administered 2019-10-09 – 2019-10-10 (×5): 5 mg via INTRAVENOUS
  Filled 2019-10-04 (×10): qty 5

## 2019-10-04 SURGICAL SUPPLY — 111 items
ADAPTER CARDIO PERF ANTE/RETRO (ADAPTER) ×5 IMPLANT
BAG DECANTER FOR FLEXI CONT (MISCELLANEOUS) ×5 IMPLANT
BLADE CLIPPER SURG (BLADE) IMPLANT
BLADE MINI RND TIP GREEN BEAV (BLADE) ×5 IMPLANT
BLADE STERNUM SYSTEM 6 (BLADE) ×5 IMPLANT
BLADE SURG 12 STRL SS (BLADE) ×5 IMPLANT
BNDG ELASTIC 4X5.8 VLCR STR LF (GAUZE/BANDAGES/DRESSINGS) ×5 IMPLANT
BNDG ELASTIC 6X5.8 VLCR STR LF (GAUZE/BANDAGES/DRESSINGS) ×5 IMPLANT
BNDG GAUZE ELAST 4 BULKY (GAUZE/BANDAGES/DRESSINGS) ×5 IMPLANT
CANISTER SUCT 3000ML PPV (MISCELLANEOUS) ×5 IMPLANT
CANNULA GUNDRY RCSP 15FR (MISCELLANEOUS) ×5 IMPLANT
CATH CPB KIT VANTRIGT (MISCELLANEOUS) ×5 IMPLANT
CATH ROBINSON RED A/P 18FR (CATHETERS) ×15 IMPLANT
CATH THORACIC 28FR RT ANG (CATHETERS) ×5 IMPLANT
CLIP FOGARTY SPRING 6M (CLIP) ×5 IMPLANT
CLIP VESOCCLUDE SM WIDE 24/CT (CLIP) ×5 IMPLANT
CNTNR URN SCR LID CUP LEK RST (MISCELLANEOUS) ×3 IMPLANT
CONN 3/8X1/2 ST GISH (MISCELLANEOUS) ×5 IMPLANT
CONT SPEC 4OZ STRL OR WHT (MISCELLANEOUS) ×2
DEFOGGER ANTIFOG KIT (MISCELLANEOUS) ×5 IMPLANT
DRAIN CHANNEL 32F RND 10.7 FF (WOUND CARE) ×5 IMPLANT
DRAPE CARDIOVASCULAR INCISE (DRAPES) ×2
DRAPE SLUSH/WARMER DISC (DRAPES) ×5 IMPLANT
DRAPE SRG 135X102X78XABS (DRAPES) ×3 IMPLANT
DRSG AQUACEL AG ADV 3.5X14 (GAUZE/BANDAGES/DRESSINGS) ×5 IMPLANT
ELECT BLADE 4.0 EZ CLEAN MEGAD (MISCELLANEOUS) ×5
ELECT BLADE 6.5 EXT (BLADE) ×5 IMPLANT
ELECT CAUTERY BLADE 6.4 (BLADE) ×5 IMPLANT
ELECT REM PT RETURN 9FT ADLT (ELECTROSURGICAL) ×10
ELECTRODE BLDE 4.0 EZ CLN MEGD (MISCELLANEOUS) ×3 IMPLANT
ELECTRODE REM PT RTRN 9FT ADLT (ELECTROSURGICAL) ×6 IMPLANT
FELT TEFLON 1X6 (MISCELLANEOUS) ×10 IMPLANT
GAUZE SPONGE 4X4 12PLY STRL (GAUZE/BANDAGES/DRESSINGS) ×10 IMPLANT
GAUZE SPONGE 4X4 12PLY STRL LF (GAUZE/BANDAGES/DRESSINGS) ×10 IMPLANT
GAUZE XEROFORM 5X9 LF (GAUZE/BANDAGES/DRESSINGS) ×5 IMPLANT
GLOVE BIO SURGEON STRL SZ 6.5 (GLOVE) ×12 IMPLANT
GLOVE BIO SURGEON STRL SZ7 (GLOVE) ×5 IMPLANT
GLOVE BIO SURGEON STRL SZ7.5 (GLOVE) ×30 IMPLANT
GLOVE BIO SURGEONS STRL SZ 6.5 (GLOVE) ×3
GLOVE BIOGEL PI IND STRL 6.5 (GLOVE) ×12 IMPLANT
GLOVE BIOGEL PI IND STRL 7.5 (GLOVE) ×3 IMPLANT
GLOVE BIOGEL PI IND STRL 8 (GLOVE) ×6 IMPLANT
GLOVE BIOGEL PI INDICATOR 6.5 (GLOVE) ×8
GLOVE BIOGEL PI INDICATOR 7.5 (GLOVE) ×2
GLOVE BIOGEL PI INDICATOR 8 (GLOVE) ×4
GLOVE SURG SS PI 6.0 STRL IVOR (GLOVE) ×10 IMPLANT
GLOVE SURG SS PI 7.0 STRL IVOR (GLOVE) ×10 IMPLANT
GOWN STRL REUS W/ TWL LRG LVL3 (GOWN DISPOSABLE) ×21 IMPLANT
GOWN STRL REUS W/ TWL XL LVL3 (GOWN DISPOSABLE) ×6 IMPLANT
GOWN STRL REUS W/TWL LRG LVL3 (GOWN DISPOSABLE) ×14
GOWN STRL REUS W/TWL XL LVL3 (GOWN DISPOSABLE) ×4
HEMOSTAT POWDER SURGIFOAM 1G (HEMOSTASIS) ×25 IMPLANT
HEMOSTAT SURGICEL 2X14 (HEMOSTASIS) ×5 IMPLANT
INSERT FOGARTY XLG (MISCELLANEOUS) IMPLANT
KIT BASIN OR (CUSTOM PROCEDURE TRAY) ×5 IMPLANT
KIT SUCTION CATH 14FR (SUCTIONS) ×5 IMPLANT
KIT TURNOVER KIT B (KITS) ×5 IMPLANT
KIT VASOVIEW HEMOPRO 2 VH 4000 (KITS) ×5 IMPLANT
LEAD PACING MYOCARDI (MISCELLANEOUS) ×5 IMPLANT
MARKER GRAFT CORONARY BYPASS (MISCELLANEOUS) ×20 IMPLANT
NS IRRIG 1000ML POUR BTL (IV SOLUTION) ×25 IMPLANT
PACK E OPEN HEART (SUTURE) ×5 IMPLANT
PACK OPEN HEART (CUSTOM PROCEDURE TRAY) ×5 IMPLANT
PAD ARMBOARD 7.5X6 YLW CONV (MISCELLANEOUS) ×10 IMPLANT
PAD ELECT DEFIB RADIOL ZOLL (MISCELLANEOUS) ×5 IMPLANT
PENCIL BUTTON HOLSTER BLD 10FT (ELECTRODE) ×5 IMPLANT
POSITIONER HEAD DONUT 9IN (MISCELLANEOUS) ×5 IMPLANT
PUNCH AORTIC ROTATE 4.0MM (MISCELLANEOUS) IMPLANT
PUNCH AORTIC ROTATE 4.5MM 8IN (MISCELLANEOUS) ×5 IMPLANT
PUNCH AORTIC ROTATE 5MM 8IN (MISCELLANEOUS) IMPLANT
SET ADHESIVE SKIN CLSR ABRA (MISCELLANEOUS) ×15 IMPLANT
SET CARDIOPLEGIA MPS 5001102 (MISCELLANEOUS) ×5 IMPLANT
SPONGE LAP 18X18 RF (DISPOSABLE) ×5 IMPLANT
SPONGE LAP 4X18 RFD (DISPOSABLE) ×5 IMPLANT
SUPPORT HEART JANKE-BARRON (MISCELLANEOUS) ×5 IMPLANT
SURGIFLO W/THROMBIN 8M KIT (HEMOSTASIS) ×5 IMPLANT
SUT BONE WAX W31G (SUTURE) ×5 IMPLANT
SUT ETHILON 3 0 FSL (SUTURE) ×10 IMPLANT
SUT MNCRL AB 4-0 PS2 18 (SUTURE) IMPLANT
SUT PROLENE 3 0 SH DA (SUTURE) ×5 IMPLANT
SUT PROLENE 3 0 SH1 36 (SUTURE) IMPLANT
SUT PROLENE 4 0 RB 1 (SUTURE) ×4
SUT PROLENE 4 0 SH DA (SUTURE) ×5 IMPLANT
SUT PROLENE 4-0 RB1 .5 CRCL 36 (SUTURE) ×6 IMPLANT
SUT PROLENE 5 0 C 1 36 (SUTURE) IMPLANT
SUT PROLENE 6 0 C 1 30 (SUTURE) ×20 IMPLANT
SUT PROLENE 6 0 CC (SUTURE) ×50 IMPLANT
SUT PROLENE 7 0 BV1 MDA (SUTURE) IMPLANT
SUT PROLENE 8 0 BV175 6 (SUTURE) IMPLANT
SUT PROLENE BLUE 7 0 (SUTURE) ×10 IMPLANT
SUT SILK  1 MH (SUTURE)
SUT SILK 1 MH (SUTURE) IMPLANT
SUT SILK 2 0 SH CR/8 (SUTURE) ×5 IMPLANT
SUT SILK 3 0 SH CR/8 (SUTURE) IMPLANT
SUT STEEL 6MS V (SUTURE) ×5 IMPLANT
SUT STEEL SZ 6 DBL 3X14 BALL (SUTURE) ×10 IMPLANT
SUT VIC AB 1 CTX 36 (SUTURE) ×10
SUT VIC AB 1 CTX36XBRD ANBCTR (SUTURE) ×15 IMPLANT
SUT VIC AB 2-0 CT1 27 (SUTURE)
SUT VIC AB 2-0 CT1 TAPERPNT 27 (SUTURE) IMPLANT
SUT VIC AB 2-0 CTX 27 (SUTURE) IMPLANT
SUT VIC AB 3-0 SH 8-18 (SUTURE) ×15 IMPLANT
SUT VIC AB 3-0 X1 27 (SUTURE) ×5 IMPLANT
SYSTEM SAHARA CHEST DRAIN ATS (WOUND CARE) ×5 IMPLANT
TAPE CLOTH SURG 4X10 WHT LF (GAUZE/BANDAGES/DRESSINGS) ×5 IMPLANT
TOWEL GREEN STERILE (TOWEL DISPOSABLE) ×5 IMPLANT
TOWEL GREEN STERILE FF (TOWEL DISPOSABLE) ×5 IMPLANT
TRAY FOLEY SLVR 16FR TEMP STAT (SET/KITS/TRAYS/PACK) ×5 IMPLANT
TUBING LAP HI FLOW INSUFFLATIO (TUBING) ×5 IMPLANT
UNDERPAD 30X36 HEAVY ABSORB (UNDERPADS AND DIAPERS) ×5 IMPLANT
WATER STERILE IRR 1000ML POUR (IV SOLUTION) ×10 IMPLANT

## 2019-10-04 NOTE — Anesthesia Procedure Notes (Signed)
Central Venous Catheter Insertion Performed by: Belinda Block, MD, anesthesiologist Start/End6/11/2019 6:45 AM, 10/04/2019 7:00 AM Preanesthetic checklist: patient identified, IV checked, site marked, risks and benefits discussed, surgical consent, pre-op evaluation and timeout performed Position: Trendelenburg Lidocaine 1% used for infiltration and patient sedated Hand hygiene performed , maximum sterile barriers used  and Seldinger technique used PA cath was placed.MAC introducer Swan type:thermodilution Procedure performed using ultrasound guided technique. Ultrasound Notes:anatomy identified Attempts: 1 Following insertion, line sutured, dressing applied and Biopatch. Post procedure assessment: blood return through all ports  Patient tolerated the procedure well with no immediate complications.

## 2019-10-04 NOTE — OR Nursing (Signed)
Chest xray taken in room.  Read by Dr. Lawson Fiscal  Results negative.

## 2019-10-04 NOTE — Op Note (Signed)
NAMERUDDY, SWIRE MEDICAL RECORD CZ:[id number] ACCOUNT [id number] DATE OF BIRTH:01-04-1946 FACILITY: [facility] LOCATION: [facility] PHYSICIAN:Johnmatthew Solorio VAN TRIGT III, MD  OPERATIVE REPORT  DATE OF PROCEDURE:  10/04/2019  OPERATIONS: 1.  Coronary artery bypass grafting x3 (left internal mammary artery to left anterior descending, saphenous vein graft to OM1, saphenous vein graft to right coronary artery). 2.  Endoscopic harvest of right leg greater saphenous vein. 3.  Excision of large keloid scar of the lower midsternum, thoracic region.  SURGEON:  Ivin Poot, MD  ASSISTANT:  Nicholes Rough, PA-C.  ANESTHESIA:  General by Dr. Roberts Gaudy.  PREOPERATIVE DIAGNOSIS:  Left main and 3-vessel coronary disease with class III accelerating angina.  POSTOPERATIVE DIAGNOSES:  Left main and 3-vessel coronary disease with class III accelerating angina.  CLINICAL NOTE:  The patient is a 74 year old male recently evaluated by Dr. Tamala Julian for symptoms of accelerating angina.  He found a significant 80% left main stenosis, as well as high-grade ostial stenosis of the RCA and recommended coronary artery bypass  surgery.  I saw the patient in the office and agreed with that recommendation.  I discussed with the patient and wife the details of coronary bypass surgery, including the use of general anesthesia and cardiopulmonary bypass, the location of the  surgical incisions, and the expected postoperative hospital recovery.  I also discussed with him the risks of stroke, bleeding, infection, organ failure, arrhythmia, and death.  The patient understood these issues and agreed to proceed with surgery under  what I felt was informed consent.  OPERATIVE FINDINGS: 1.  Adequate conduit, although the mammary artery was small size. 2.  No packed cell transfusions required. 3.  Wide excision of keloid-type scar in the lower midsternum submitted to pathology.  DESCRIPTION OF PROCEDURE:  The patient was brought from  preop holding where informed consent was documented and final issues discussed with the patient.  He was placed supine on the operating table and general anesthesia was induced under invasive  hemodynamic monitoring.  An echocardiogram was performed by the anesthesiologist, which confirmed the preoperative diagnosis of normal LV function without valvular disease.  The patient was prepped and draped as a sterile field.  A proper time-out was  performed.  A sternotomy was performed and the saphenous vein was harvested as a pedicle graft from its origin at the subclavian vessels while the saphenous vein was harvested endoscopically from the right leg.  The sternal retractor was placed using the  deep blades because of the patient's obese body habitus.  The pericardium was opened and suspended.  Pursestrings were placed in the ascending aorta and right atrium and after the vein had been harvested, the patient was heparinized and cannulated for  bypass.  The coronaries were identified for grafting and the mammary artery and vein grafts were prepared for the distal anastomoses.  Cardioplegic cannulas were placed both antegrade and retrograde, cold blood cardioplegia.  The patient was cooled to 32  degrees.  The aortic crossclamp was applied and 1 L of cold blood cardioplegia was delivered in split doses between the antegrade aortic and retrograde coronary sinus catheters.  There was good cardioplegic arrest and supple temperature dropped less  than 12 degrees.  Cardioplegia was delivered every 20 minutes.  The distal coronary anastomoses were performed.  The first distal anastomosis was to the distal RCA.  This was a 1.5 mm vessel.  There is an ostial 99% stenosis.  Reverse saphenous vein was sewn end-to-side with running 7-0 Prolene with good flow  through  the graft.  Cardioplegia was redosed.  The second distal anastomosis was to the OM1 branch of the left coronary.  There was a proximal 85% left main  stenosis.  Reverse saphenous vein was sewn end-to-side with running 7-0 Prolene with good flow through the graft.  Cardioplegia was redosed.  The third distal anastomosis was thee distal LAD.  Proximally, it was deeply intramyocardial.  It was 1.4 mm vessel.  The left IMA pedicle was brought through an opening in the left lateral pericardium and was brought down onto the LAD and sewn  end-to-side with running 8-0 Prolene.  There was good flow through the anastomosis after briefly releasing the pedicle bulldog on the mammary vessel.  The bulldog was reapplied and the pedicle was secured to the epicardium.  Cardioplegia was redosed.  While the crossclamp was still in place, 2 proximal vein anastomoses were performed on the ascending aorta using a 4.5 mm punch and running 6-0 Prolene.  Prior to tying down the final proximal anastomosis, air was vented from the coronaries with a dose  of retrograde warm blood cardioplegia.  After the crossclamp was removed, the heart was cardioverted back to a regular rhythm.  The vein grafts were de-aired and opened.  Each had good flow and hemostasis was documented at the proximal and distal sites.  The patient was rewarmed and  reperfused.  Temporary pacing wires were applied.  The lungs reexpanded.  The ventilator was resumed.  The patient was then weaned off cardiopulmonary bypass without difficulty and no inotropic support was needed.  Protamine was administered without  adverse reaction.  The cannulas were removed.  The mediastinum was irrigated.  The superior pericardial fat was closed over the aorta.  The anterior mediastinal and left pleural chest tubes were placed and brought out through separate incisions.  The sternum was closed with interrupted steel wire.  The patient remained stable.  The pectoralis fascia was closed with a running #1 Vicryl.  The subcutaneous and skin layers were closed in layers using Vicryl.  In the area of the lower sternum, there was a  large transverse keloid -type scar, which was excised as it was incorporated into the sternal incision.  The subcutaneous tissue was mobilized to allow some flexibility to the skin for direct closure.  The  subcutaneous tissue in the area of the excised scar was closed with interrupted 3-0 monofilament suture.  The skin was closed with interrupted nylon and the ABRA system was used on the skin to reduce skin tension on that part of the incision which had  been closed after excision of the large keloid.  Total cardiopulmonary bypass time 120 minutes.  The patient returned to the ICU in stable condition.  VN/NUANCE  D:10/04/2019 T:10/04/2019 JOB:[id number]

## 2019-10-04 NOTE — Procedures (Signed)
Extubation Procedure Note  Patient Details:   Name: Steve Frye DOB: [birth year] MRN: [id number]   Airway Documentation:    Vent end date: 10/04/19 Vent end time: 2117   Evaluation  O2 sats: stable throughout Complications: No apparent complications Patient did tolerate procedure well. Bilateral Breath Sounds: Clear    Patient extubated to 4L [name].  NIF -40  VC 1.9L  Positive for cuff leak.. Patient able to vocalize post extubation.  Catha Brow 10/04/2019, 9:20 PM

## 2019-10-04 NOTE — Anesthesia Procedure Notes (Signed)
Procedure Name: Intubation Date/Time: 10/04/2019 8:25 AM Performed by: Valda Favia, CRNA Pre-anesthesia Checklist: Patient identified, Emergency Drugs available, Suction available and Patient being monitored Patient Re-evaluated:Patient Re-evaluated prior to induction Oxygen Delivery Method: Circle System Utilized Preoxygenation: Pre-oxygenation with 100% oxygen Induction Type: IV induction Ventilation: Mask ventilation without difficulty and Oral airway inserted - appropriate to patient size Laryngoscope Size: Mac and 4 Grade View: Grade II Tube type: Oral Tube size: 8.0 mm Number of attempts: 1 Airway Equipment and Method: Stylet and Oral airway Placement Confirmation: ETT inserted through vocal cords under direct vision,  positive ETCO2 and breath sounds checked- equal and bilateral Secured at: 22 cm Tube secured with: Tape Dental Injury: Teeth and Oropharynx as per pre-operative assessment

## 2019-10-04 NOTE — Progress Notes (Signed)
Spoke to Dr. Lawson Fiscal.  Patient does not need PFT's.

## 2019-10-04 NOTE — Progress Notes (Signed)
  Echocardiogram Echocardiogram Transesophageal has been performed.  Steve Frye 10/04/2019, 9:06 AM

## 2019-10-04 NOTE — Anesthesia Preprocedure Evaluation (Signed)
Anesthesia Evaluation  Patient identified by MRN, date of birth, ID band Patient awake    Reviewed: Allergy & Precautions, NPO status , Patient's Chart, lab work & pertinent test results  Airway Mallampati: II  TM Distance: >3 FB Neck ROM: Full    Dental  (+) Teeth Intact, Dental Advisory Given   Pulmonary former smoker,    breath sounds clear to auscultation       Cardiovascular  Rhythm:Regular Rate:Normal     Neuro/Psych    GI/Hepatic   Endo/Other    Renal/GU      Musculoskeletal   Abdominal   Peds  Hematology   Anesthesia Other Findings   Reproductive/Obstetrics                             Anesthesia Physical Anesthesia Plan  ASA: III  Anesthesia Plan: General   Post-op Pain Management:    Induction: Intravenous  PONV Risk Score and Plan: Ondansetron and Dexamethasone  Airway Management Planned: Oral ETT  Additional Equipment: Arterial line, 3D TEE, Ultrasound Guidance Line Placement and PA Cath  Intra-op Plan:   Post-operative Plan: Post-operative intubation/ventilation  Informed Consent: I have reviewed the patients History and Physical, chart, labs and discussed the procedure including the risks, benefits and alternatives for the proposed anesthesia with the patient or authorized representative who has indicated his/her understanding and acceptance.     Dental advisory given  Plan Discussed with: CRNA and Anesthesiologist  Anesthesia Plan Comments:         Anesthesia Quick Evaluation

## 2019-10-04 NOTE — Transfer of Care (Signed)
Immediate Anesthesia Transfer of Care Note  Patient: Steve Frye  Procedure(s) Performed: CORONARY ARTERY BYPASS GRAFTING (CABG) times three using right greater saphenous vein and left internal mammary  (N/A Chest) TRANSESOPHAGEAL ECHOCARDIOGRAM (TEE) (N/A ) Excision Of Sternal Keloid (N/A ) Endovein Harvest Of Greater Saphenous Vein (Right )  Patient Location: SICU  Anesthesia Type:General  Level of Consciousness: Patient remains intubated per anesthesia plan  Airway & Oxygen Therapy: Patient remains intubated per anesthesia plan and Patient placed on Ventilator (see vital sign flow sheet for setting)  Post-op Assessment: Report given to RN and Post -op Vital signs reviewed and stable  Post vital signs: Reviewed and stable  Last Vitals:  Vitals Value Taken Time  BP    Temp 36.4 C 10/04/19 1504  Pulse 80 10/04/19 1504  Resp 12 10/04/19 1504  SpO2 100 % 10/04/19 1504  Vitals shown include unvalidated device data.  Last Pain:  Vitals:   10/04/19 0614  TempSrc: Oral  PainSc:          Complications: No apparent anesthesia complications

## 2019-10-04 NOTE — Progress Notes (Signed)
Paged Dr. Nils Pyle regarding PFT's.  Awaiting call back.

## 2019-10-04 NOTE — Anesthesia Procedure Notes (Signed)
Arterial Line Insertion Performed by: Candis Shine, CRNA, CRNA  Patient location: Pre-op. Preanesthetic checklist: patient identified, IV checked, site marked, risks and benefits discussed, surgical consent, monitors and equipment checked, pre-op evaluation, timeout performed and anesthesia consent Lidocaine 1% used for infiltration and patient sedated Left, radial was placed Catheter size: 20 G Hand hygiene performed , maximum sterile barriers used  and Seldinger technique used Allen's test indicative of satisfactory collateral circulation Attempts: 2 Procedure performed without using ultrasound guided technique. Following insertion, dressing applied and Biopatch. Post procedure assessment: normal  Patient tolerated the procedure well with no immediate complications.

## 2019-10-04 NOTE — Progress Notes (Signed)
CT surgery p.m. Rounds  Status post CABG for left main severe CAD Starting to wake up but still apnea not ready for extubation Cardiac index 2.2 on minimal inotropic support Chest tube output Pacing at 80

## 2019-10-04 NOTE — Brief Op Note (Signed)
10/04/2019  9:09 AM  PATIENT:  Steve Frye  74 y.o. male  PRE-OPERATIVE DIAGNOSIS:  CORONARY ARTERY DISEASE  POST-OPERATIVE DIAGNOSIS:  CORONARY ARTERY DISEASE  PROCEDURE:  Procedure(s): CORONARY ARTERY BYPASS GRAFTING (CABG) times three using right greater saphenous vein and left internal mammary  (N/A) TRANSESOPHAGEAL ECHOCARDIOGRAM (TEE) (N/A) Excision Of Sternal Keloid (N/A) Endovein Harvest Of Greater Saphenous Vein (Right) LIMA to LAD SVG to PDA SVG to OM1  SURGEON:  Surgeon(s) and Role:    Ivin Poot, MD - Primary  PHYSICIAN ASSISTANT:  Nicholes Rough, PA-C   ANESTHESIA:   general  EBL:  800 mL   BLOOD ADMINISTERED:none  DRAINS: ROUTINE   LOCAL MEDICATIONS USED:  NONE  SPECIMEN:  No Specimen  DISPOSITION OF SPECIMEN:  N/A  COUNTS:  YES  DICTATION: .Dragon Dictation  PLAN OF CARE: Admit to inpatient   PATIENT DISPOSITION:  ICU - intubated and hemodynamically stable.   Delay start of Pharmacological VTE agent (>24hrs) due to surgical blood loss or risk of bleeding: yes

## 2019-10-04 NOTE — Progress Notes (Signed)
Pre Procedure note for inpatients:   Steve Frye has been scheduled for Procedure(s): CORONARY ARTERY BYPASS GRAFTING (CABG) (N/A) TRANSESOPHAGEAL ECHOCARDIOGRAM (TEE) (N/A) today. The various methods of treatment have been discussed with the patient. After consideration of the risks, benefits and treatment options the patient has consented to the planned procedure.   The patient has been seen and labs reviewed. There are no changes in the patient's condition to prevent proceeding with the planned procedure today.  Recent labs:  Lab Results  Component Value Date   WBC 9.1 09/29/2019   HGB 16.0 09/29/2019   HCT 47.7 09/29/2019   PLT 229 09/29/2019   GLUCOSE 92 09/29/2019   CHOL 150 03/12/2017   TRIG 122 03/12/2017   HDL 40 03/12/2017   LDLCALC 86 03/12/2017   ALT 29 09/29/2019   AST 23 09/29/2019   NA 136 09/29/2019   K 4.4 09/29/2019   CL 107 09/29/2019   CREATININE 0.75 09/29/2019   BUN 11 09/29/2019   CO2 20 (L) 09/29/2019   INR 1.0 09/29/2019   HGBA1C 5.8 (H) 09/29/2019    Len Childs, MD 10/04/2019 8:08 AM

## 2019-10-05 ENCOUNTER — Inpatient Hospital Stay (HOSPITAL_COMMUNITY): Payer: Medicare Other

## 2019-10-05 LAB — GLUCOSE, CAPILLARY
Glucose-Capillary: 112 mg/dL — ABNORMAL HIGH (ref 70–99)
Glucose-Capillary: 120 mg/dL — ABNORMAL HIGH (ref 70–99)
Glucose-Capillary: 126 mg/dL — ABNORMAL HIGH (ref 70–99)
Glucose-Capillary: 128 mg/dL — ABNORMAL HIGH (ref 70–99)
Glucose-Capillary: 132 mg/dL — ABNORMAL HIGH (ref 70–99)
Glucose-Capillary: 132 mg/dL — ABNORMAL HIGH (ref 70–99)
Glucose-Capillary: 134 mg/dL — ABNORMAL HIGH (ref 70–99)
Glucose-Capillary: 135 mg/dL — ABNORMAL HIGH (ref 70–99)
Glucose-Capillary: 135 mg/dL — ABNORMAL HIGH (ref 70–99)
Glucose-Capillary: 137 mg/dL — ABNORMAL HIGH (ref 70–99)
Glucose-Capillary: 142 mg/dL — ABNORMAL HIGH (ref 70–99)
Glucose-Capillary: 163 mg/dL — ABNORMAL HIGH (ref 70–99)
Glucose-Capillary: 164 mg/dL — ABNORMAL HIGH (ref 70–99)
Glucose-Capillary: 174 mg/dL — ABNORMAL HIGH (ref 70–99)

## 2019-10-05 LAB — BASIC METABOLIC PANEL
Anion gap: 7 (ref 5–15)
Anion gap: 7 (ref 5–15)
BUN: 9 mg/dL (ref 8–23)
BUN: 11 mg/dL (ref 8–23)
CO2: 21 mmol/L — ABNORMAL LOW (ref 22–32)
CO2: 22 mmol/L (ref 22–32)
Calcium: 7.3 mg/dL — ABNORMAL LOW (ref 8.9–10.3)
Calcium: 8.1 mg/dL — ABNORMAL LOW (ref 8.9–10.3)
Chloride: 109 mmol/L (ref 98–111)
Chloride: 107 mmol/L (ref 98–111)
Creatinine, Ser: 0.76 mg/dL (ref 0.61–1.24)
Creatinine, Ser: 0.83 mg/dL (ref 0.61–1.24)
GFR calc Af Amer: >60 mL/min (ref >60)
GFR calc Af Amer: >60 mL/min (ref >60)
GFR calc non Af Amer: >60 mL/min (ref >60)
GFR calc non Af Amer: >60 mL/min (ref >60)
Glucose, Bld: 162 mg/dL — ABNORMAL HIGH (ref 70–99)
Glucose, Bld: 144 mg/dL — ABNORMAL HIGH (ref 70–99)
Potassium: 4 mmol/L (ref 3.5–5.1)
Potassium: 4 mmol/L (ref 3.5–5.1)
Sodium: 137 mmol/L (ref 135–145)
Sodium: 136 mmol/L (ref 135–145)

## 2019-10-05 LAB — BPAM FFP
Blood Product Expiration Date: 202106102359
Blood Product Expiration Date: 202106112359
ISSUE DATE / TIME: 202106081303
ISSUE DATE / TIME: 202106081303
Unit Type and Rh: 600
Unit Type and Rh: 6200

## 2019-10-05 LAB — CBC
HCT: 26.9 % — ABNORMAL LOW (ref 39.0–52.0)
HCT: 30.7 % — ABNORMAL LOW (ref 39.0–52.0)
Hemoglobin: 9.5 g/dL — ABNORMAL LOW (ref 13.0–17.0)
Hemoglobin: 10.4 g/dL — ABNORMAL LOW (ref 13.0–17.0)
MCH: 31.3 pg (ref 26.0–34.0)
MCH: 30.6 pg (ref 26.0–34.0)
MCHC: 35.3 g/dL (ref 30.0–36.0)
MCHC: 33.9 g/dL (ref 30.0–36.0)
MCV: 88.5 fL (ref 80.0–100.0)
MCV: 90.3 fL (ref 80.0–100.0)
Platelets: 110 10*3/uL — ABNORMAL LOW (ref 150–400)
Platelets: 160 10*3/uL (ref 150–400)
RBC: 3.04 MIL/uL — ABNORMAL LOW (ref 4.22–5.81)
RBC: 3.4 MIL/uL — ABNORMAL LOW (ref 4.22–5.81)
RDW: 12 % (ref 11.5–15.5)
RDW: 12.6 % (ref 11.5–15.5)
WBC: 9.1 10*3/uL (ref 4.0–10.5)
WBC: 17.1 10*3/uL — ABNORMAL HIGH (ref 4.0–10.5)
nRBC: 0 % (ref 0.0–0.2)
nRBC: 0 % (ref 0.0–0.2)

## 2019-10-05 LAB — SURGICAL PATHOLOGY

## 2019-10-05 LAB — POCT I-STAT 7, (LYTES, BLD GAS, ICA,H+H)
Acid-base deficit: 3 mmol/L — ABNORMAL HIGH (ref 0.0–2.0)
Bicarbonate: 21.6 mmol/L (ref 20.0–28.0)
Calcium, Ion: 1.16 mmol/L (ref 1.15–1.40)
HCT: 26 % — ABNORMAL LOW (ref 39.0–52.0)
Hemoglobin: 8.8 g/dL — ABNORMAL LOW (ref 13.0–17.0)
O2 Saturation: 94 %
Patient temperature: 37
Potassium: 3.8 mmol/L (ref 3.5–5.1)
Sodium: 140 mmol/L (ref 135–145)
TCO2: 23 mmol/L (ref 22–32)
pCO2 arterial: 36 mmHg (ref 32.0–48.0)
pH, Arterial: 7.386 (ref 7.350–7.450)
pO2, Arterial: 71 mmHg — ABNORMAL LOW (ref 83.0–108.0)

## 2019-10-05 LAB — PREPARE FRESH FROZEN PLASMA
Unit division: 0
Unit division: 0

## 2019-10-05 LAB — MAGNESIUM
Magnesium: 2.2 mg/dL (ref 1.7–2.4)
Magnesium: 1.8 mg/dL (ref 1.7–2.4)

## 2019-10-05 MED ORDER — MIDAZOLAM HCL 2 MG/2ML IJ SOLN: 2.0000 mg | INTRAMUSCULAR | Status: DC | PRN

## 2019-10-05 MED ORDER — FUROSEMIDE 10 MG/ML IJ SOLN
20.0000 mg | Freq: Two times a day (BID) | INTRAMUSCULAR | Status: DC
  Administered 2019-10-05 (×2): 20 mg via INTRAVENOUS
  Filled 2019-10-05 (×2): qty 2

## 2019-10-05 MED ORDER — POTASSIUM CHLORIDE CRYS ER 20 MEQ PO TBCR
20.0000 meq | EXTENDED_RELEASE_TABLET | Freq: Two times a day (BID) | ORAL | Status: DC
  Administered 2019-10-05 (×2): 20 meq via ORAL
  Filled 2019-10-05 (×2): qty 1

## 2019-10-05 MED ORDER — INSULIN ASPART 100 UNIT/ML ~~LOC~~ SOLN
0.0000 [IU] | SUBCUTANEOUS | Status: DC
  Administered 2019-10-05: 2 [IU] via SUBCUTANEOUS
  Administered 2019-10-05: 4 [IU] via SUBCUTANEOUS
  Administered 2019-10-06 (×3): 2 [IU] via SUBCUTANEOUS

## 2019-10-05 NOTE — Progress Notes (Signed)
[facility] DAILY ICU PROGRESS NOTE                   [address]            [place] [address]          [phone]   1 Day Post-Op Procedure(s) (LRB): CORONARY ARTERY BYPASS GRAFTING (CABG) times three using right greater saphenous vein and left internal mammary  (N/A) TRANSESOPHAGEAL ECHOCARDIOGRAM (TEE) (N/A) Excision Of Sternal Keloid (N/A) Endovein Harvest Of Greater Saphenous Vein (Right)  Total Length of Stay:  LOS: 1 day   Subjective: Feels okay this morning. Pain is well controlled.   Objective: Vital signs in last 24 hours: Temp:  [96.6 F (35.9 C)-99.3 F (37.4 C)] 98.8 F (37.1 C) (06/09 0830) Pulse Rate:  [69-84] 72 (06/09 0830) Cardiac Rhythm: Atrial paced (06/09 0800) Resp:  [11-23] 12 (06/09 0830) BP: (91-143)/(52-84) 116/58 (06/09 0830) SpO2:  [95 %-100 %] 97 % (06/09 0830) Arterial Line BP: (94-165)/(44-79) 161/52 (06/09 0830) FiO2 (%):  [40 %-50 %] 40 % (06/08 2026) Weight:  [100.6 kg] 100.6 kg (06/09 0500)  Filed Weights   10/04/19 0614 10/05/19 0500  Weight: 96.9 kg 100.6 kg    Weight change: 3.712 kg   Hemodynamic parameters for last 24 hours: PAP: (19-46)/(1-21) 40/16 CO:  [4.2 L/min-7.1 L/min] 5.7 L/min CI:  [1.9 L/min/m2-3.2 L/min/m2] 2.6 L/min/m2  Intake/Output from previous day: 06/08 0701 - 06/09 0700 In: 7406.3 [I.V.:4998.7; Blood:539; IV Piggyback:1868.7] Out: 5427 [Urine:2050; Blood:800; Chest Tube:570]  Intake/Output this shift: Total I/O In: 47.8 [I.V.:47.8] Out: 0   Current Meds: Scheduled Meds: . sodium chloride   Intravenous Once  . acetaminophen  1,000 mg Oral Q6H   Or  . acetaminophen (TYLENOL) oral liquid 160 mg/5 mL  1,000 mg Per Tube Q6H  . amiodarone  200 mg Oral BID  . aspirin EC  325 mg Oral Daily   Or  . aspirin  324 mg Per Tube Daily  . bisacodyl  10 mg Oral Daily   Or  . bisacodyl  10 mg Rectal Daily  . chlorhexidine gluconate (MEDLINE KIT)  15 mL Mouth Rinse BID  . Chlorhexidine Gluconate  Cloth  6 each Topical Q0600  . docusate sodium  200 mg Oral Daily  . furosemide  20 mg Intravenous BID  . insulin aspart  0-24 Units Subcutaneous Q4H  . mouth rinse  15 mL Mouth Rinse BID  . metoCLOPramide (REGLAN) injection  10 mg Intravenous Q6H  . metoprolol tartrate  12.5 mg Oral BID   Or  . metoprolol tartrate  12.5 mg Per Tube BID  . [START ON 10/06/2019] pantoprazole  40 mg Oral Daily  . potassium chloride  20 mEq Oral BID  . simvastatin  40 mg Oral Daily  . sodium chloride flush  3 mL Intravenous Q12H   Continuous Infusions: . sodium chloride    . sodium chloride    . sodium chloride 20 mL/hr at 10/05/19 0400  . albumin human Stopped (10/04/19 2304)  . famotidine (PEPCID) IV Stopped (10/04/19 1603)  . insulin 0.5 Units/hr (10/04/19 1500)  . lactated ringers    . lactated ringers 20 mL/hr at 10/04/19 1629  . levofloxacin (LEVAQUIN) IV    . nitroGLYCERIN 0 mcg/min (10/04/19 1500)  . phenylephrine (NEO-SYNEPHRINE) Adult infusion Stopped (10/05/19 0737)   PRN Meds:.sodium chloride, albumin human, dextrose, metoprolol tartrate, midazolam, morphine injection, ondansetron (ZOFRAN) IV, oxyCODONE, sodium chloride flush, traMADol  General appearance: alert, cooperative  and no distress Heart: regular rate and rhythm, S1, S2 normal, no murmur, click, rub or gallop Lungs: clear to auscultation bilaterally Abdomen: soft, non-tender; bowel sounds normal; no masses,  no organomegaly Extremities: extremities normal, atraumatic, no cyanosis or edema Wound: clean and dry dressed with a sterile dressing  Lab Results: CBC: Recent Labs    10/04/19 2108 10/04/19 2113 10/05/19 0205 10/05/19 0554  WBC 10.0  --  9.1  --   HGB 10.7*   < > 9.5* 8.8*  HCT 31.0*   < > 26.9* 26.0*  PLT 121*  --  110*  --    < > = values in this interval not displayed.   BMET:  Recent Labs    10/04/19 2108 10/04/19 2113 10/05/19 0205 10/05/19 0554  NA 136   < > 137 140  K 3.8   < > 4.0 3.8  CL 111   --  109  --   CO2 20*  --  21*  --   GLUCOSE 171*  --  162*  --   BUN 8  --  9  --   CREATININE 0.72  --  0.76  --   CALCIUM 7.2*  --  7.3*  --    < > = values in this interval not displayed.    CMET: Lab Results  Component Value Date   WBC 9.1 10/05/2019   HGB 8.8 (L) 10/05/2019   HCT 26.0 (L) 10/05/2019   PLT 110 (L) 10/05/2019   GLUCOSE 162 (H) 10/05/2019   CHOL 150 03/12/2017   TRIG 122 03/12/2017   HDL 40 03/12/2017   LDLCALC 86 03/12/2017   ALT 29 09/29/2019   AST 23 09/29/2019   NA 140 10/05/2019   K 3.8 10/05/2019   CL 109 10/05/2019   CREATININE 0.76 10/05/2019   BUN 9 10/05/2019   CO2 21 (L) 10/05/2019   INR 1.4 (H) 10/04/2019   HGBA1C 5.8 (H) 09/29/2019      PT/INR:  Recent Labs    10/04/19 1507  LABPROT 16.9*  INR 1.4*   Radiology: DG Chest Portable 1 View  Result Date: 10/04/2019 CLINICAL DATA:  Status post CABG. EXAM: PORTABLE CHEST 1 VIEW COMPARISON:  September 29, 2019 FINDINGS: An endotracheal tube is seen with its distal tip approximately 5.1 cm from the carina. A right internal jugular Swan-Ganz catheter is noted with its distal tip overlying the left hilum. There is a single left-sided chest tube. Its distal tip is seen overlying the perihilar region. Mild atelectasis is seen within the left lung base. There is no evidence of a pleural effusion or pneumothorax. The cardiac silhouette is mildly enlarged. The visualized skeletal structures are unremarkable. IMPRESSION: 1. Mild left basilar atelectasis. 2. Interval median sternotomy/CABG since the prior study dated September 29, 2019. Electronically Signed   By: Virgina Norfolk M.D.   On: 10/04/2019 15:12     Assessment/Plan: S/P Procedure(s) (LRB): CORONARY ARTERY BYPASS GRAFTING (CABG) times three using right greater saphenous vein and left internal mammary  (N/A) TRANSESOPHAGEAL ECHOCARDIOGRAM (TEE) (N/A) Excision Of Sternal Keloid (N/A) Endovein Harvest Of Greater Saphenous Vein (Right)   1. CV-A-paced  in the 70s, BP well controlled.  2. Pulm- tolerating room air with good oxygen saturation. CXR stable without obvious pneumo or significant pleural effusion.  3. Renal-creatinine 0.76, electrolytes okay. Continue IV lasix for fluid overload 4. H and H 8.8/26.0, expected acute blood loss anemia  5. Endo-blood glucose well controlled. Change over from insulin gtt to  SSI and levemir.   Plan: See progression orders. OOB to chair. Okay to remove arterial line since he is off vasoactive drips, discontinue swan-ganz catheter. Keep chest tubes and foley for now. Transition insulin gtt. Starting diuresis for fluid overload. Progressing nicely POD 1.     Elgie Collard 10/05/2019 9:09 AM

## 2019-10-05 NOTE — Progress Notes (Signed)
° °   °  [address]       [place] [address]             [phone]      Resting comfortably  BP 126/64    Pulse 81    Temp 98 F (36.7 C)    Resp 20    Ht 6' (1.829 m)    Wt 100.6 kg    SpO2 98%    BMI 30.08 kg/m   Intake/Output Summary (Last 24 hours) at 10/05/2019 1843 Last data filed at 10/05/2019 1700 Gross per 24 hour  Intake 2304.11 ml  Output 2380 ml  Net -75.89 ml   CBG 120-174 K= 4.0 Hct= 31  Doing well POD # 1  Jarin Cornfield C. Roxan Hockey, MD [facility] [phone]

## 2019-10-05 NOTE — Progress Notes (Signed)
1 Day Post-Op Procedure(s) (LRB): CORONARY ARTERY BYPASS GRAFTING (CABG) times three using right greater saphenous vein and left internal mammary  (N/A) TRANSESOPHAGEAL ECHOCARDIOGRAM (TEE) (N/A) Excision Of Sternal Keloid (N/A) Endovein Harvest Of Greater Saphenous Vein (Right) Subjective:stable with mild chest discomfort  Objective: Vital signs in last 24 hours: Temp:  [96.6 F (35.9 C)-99.3 F (37.4 C)] 98.8 F (37.1 C) (06/09 0830) Pulse Rate:  [69-84] 72 (06/09 0830) Cardiac Rhythm: Atrial paced (06/09 0800) Resp:  [11-23] 12 (06/09 0830) BP: (91-143)/(52-84) 116/58 (06/09 0830) SpO2:  [95 %-100 %] 97 % (06/09 0830) Arterial Line BP: (94-165)/(44-79) 161/52 (06/09 0830) FiO2 (%):  [40 %-50 %] 40 % (06/08 2026) Weight:  [100.6 kg] 100.6 kg (06/09 0500)  Hemodynamic parameters for last 24 hours: PAP: (19-46)/(1-21) 40/16 CO:  [4.2 L/min-7.1 L/min] 5.7 L/min CI:  [1.9 L/min/m2-3.2 L/min/m2] 2.6 L/min/m2  Intake/Output from previous day: 06/08 0701 - 06/09 0700 In: 7406.3 [I.V.:4998.7; Blood:539; IV Piggyback:1868.7] Out: 3420 [Urine:2050; Blood:800; Chest Tube:570] Intake/Output this shift: Total I/O In: 47.8 [I.V.:47.8] Out: 0        Exam    General- alert and comfortable    Neck- no JVD, no cervical adenopathy palpable, no carotid bruit   Lungs- clear without rales, wheezes   Cor- regular rate and rhythm, no murmur , gallop   Abdomen- soft, non-tender   Extremities - warm, non-tender, minimal edema   Neuro- oriented, appropriate, no focal weakness   Lab Results: Recent Labs    10/04/19 2108 10/04/19 2113 10/05/19 0205 10/05/19 0554  WBC 10.0  --  9.1  --   HGB 10.7*   < > 9.5* 8.8*  HCT 31.0*   < > 26.9* 26.0*  PLT 121*  --  110*  --    < > = values in this interval not displayed.   BMET:  Recent Labs    10/04/19 2108 10/04/19 2113 10/05/19 0205 10/05/19 0554  NA 136   < > 137 140  K 3.8   < > 4.0 3.8  CL 111  --  109  --   CO2 20*  --  21*   --   GLUCOSE 171*  --  162*  --   BUN 8  --  9  --   CREATININE 0.72  --  0.76  --   CALCIUM 7.2*  --  7.3*  --    < > = values in this interval not displayed.    PT/INR:  Recent Labs    10/04/19 1507  LABPROT 16.9*  INR 1.4*   ABG    Component Value Date/Time   PHART 7.386 10/05/2019 0554   HCO3 21.6 10/05/2019 0554   TCO2 23 10/05/2019 0554   ACIDBASEDEF 3.0 (H) 10/05/2019 0554   O2SAT 94.0 10/05/2019 0554   CBG (last 3)  Recent Labs    10/05/19 0424 10/05/19 0537 10/05/19 0812  GLUCAP 137* 126* 135*    Assessment/Plan: S/P Procedure(s) (LRB): CORONARY ARTERY BYPASS GRAFTING (CABG) times three using right greater saphenous vein and left internal mammary  (N/A) TRANSESOPHAGEAL ECHOCARDIOGRAM (TEE) (N/A) Excision Of Sternal Keloid (N/A) Endovein Harvest Of Greater Saphenous Vein (Right) Mobilize Diuresis Diabetes control See progression orders start po amio for hx remote afib   LOS: 1 day    Steve Frye 10/05/2019

## 2019-10-06 ENCOUNTER — Inpatient Hospital Stay (HOSPITAL_COMMUNITY): Payer: Medicare Other

## 2019-10-06 LAB — POCT I-STAT 7, (LYTES, BLD GAS, ICA,H+H)
Acid-Base Excess: 2 mmol/L (ref 0.0–2.0)
Acid-Base Excess: 4 mmol/L — ABNORMAL HIGH (ref 0.0–2.0)
Acid-Base Excess: 1 mmol/L (ref 0.0–2.0)
Acid-Base Excess: 0 mmol/L (ref 0.0–2.0)
Bicarbonate: 27.6 mmol/L (ref 20.0–28.0)
Bicarbonate: 28.5 mmol/L — ABNORMAL HIGH (ref 20.0–28.0)
Bicarbonate: 24.7 mmol/L (ref 20.0–28.0)
Bicarbonate: 25.7 mmol/L (ref 20.0–28.0)
Calcium, Ion: 1.23 mmol/L (ref 1.15–1.40)
Calcium, Ion: 0.93 mmol/L — ABNORMAL LOW (ref 1.15–1.40)
Calcium, Ion: 1.04 mmol/L — ABNORMAL LOW (ref 1.15–1.40)
Calcium, Ion: 1.03 mmol/L — ABNORMAL LOW (ref 1.15–1.40)
HCT: 41 % (ref 39.0–52.0)
HCT: 29 % — ABNORMAL LOW (ref 39.0–52.0)
HCT: 32 % — ABNORMAL LOW (ref 39.0–52.0)
HCT: 30 % — ABNORMAL LOW (ref 39.0–52.0)
Hemoglobin: 13.9 g/dL (ref 13.0–17.0)
Hemoglobin: 9.9 g/dL — ABNORMAL LOW (ref 13.0–17.0)
Hemoglobin: 10.9 g/dL — ABNORMAL LOW (ref 13.0–17.0)
Hemoglobin: 10.2 g/dL — ABNORMAL LOW (ref 13.0–17.0)
O2 Saturation: 100 %
O2 Saturation: 100 %
O2 Saturation: 100 %
O2 Saturation: 100 %
Potassium: 4 mmol/L (ref 3.5–5.1)
Potassium: 4.3 mmol/L (ref 3.5–5.1)
Potassium: 4.2 mmol/L (ref 3.5–5.1)
Potassium: 3.8 mmol/L (ref 3.5–5.1)
Sodium: 140 mmol/L (ref 135–145)
Sodium: 142 mmol/L (ref 135–145)
Sodium: 139 mmol/L (ref 135–145)
Sodium: 141 mmol/L (ref 135–145)
TCO2: 29 mmol/L (ref 22–32)
TCO2: 30 mmol/L (ref 22–32)
TCO2: 26 mmol/L (ref 22–32)
TCO2: 27 mmol/L (ref 22–32)
pCO2 arterial: 47 mmHg (ref 32.0–48.0)
pCO2 arterial: 41.8 mmHg (ref 32.0–48.0)
pCO2 arterial: 36.4 mmHg (ref 32.0–48.0)
pCO2 arterial: 44 mmHg (ref 32.0–48.0)
pH, Arterial: 7.376 (ref 7.350–7.450)
pH, Arterial: 7.442 (ref 7.350–7.450)
pH, Arterial: 7.439 (ref 7.350–7.450)
pH, Arterial: 7.374 (ref 7.350–7.450)
pO2, Arterial: 555 mmHg — ABNORMAL HIGH (ref 83.0–108.0)
pO2, Arterial: 331 mmHg — ABNORMAL HIGH (ref 83.0–108.0)
pO2, Arterial: 249 mmHg — ABNORMAL HIGH (ref 83.0–108.0)
pO2, Arterial: 476 mmHg — ABNORMAL HIGH (ref 83.0–108.0)

## 2019-10-06 LAB — POCT I-STAT, CHEM 8
BUN: 9 mg/dL (ref 8–23)
BUN: 7 mg/dL — ABNORMAL LOW (ref 8–23)
BUN: 8 mg/dL (ref 8–23)
BUN: 8 mg/dL (ref 8–23)
BUN: 8 mg/dL (ref 8–23)
Calcium, Ion: 1.26 mmol/L (ref 1.15–1.40)
Calcium, Ion: 0.93 mmol/L — ABNORMAL LOW (ref 1.15–1.40)
Calcium, Ion: 1.23 mmol/L (ref 1.15–1.40)
Calcium, Ion: 1.03 mmol/L — ABNORMAL LOW (ref 1.15–1.40)
Calcium, Ion: 1.06 mmol/L — ABNORMAL LOW (ref 1.15–1.40)
Chloride: 104 mmol/L (ref 98–111)
Chloride: 100 mmol/L (ref 98–111)
Chloride: 104 mmol/L (ref 98–111)
Chloride: 104 mmol/L (ref 98–111)
Chloride: 103 mmol/L (ref 98–111)
Creatinine, Ser: 0.6 mg/dL — ABNORMAL LOW (ref 0.61–1.24)
Creatinine, Ser: 0.4 mg/dL — ABNORMAL LOW (ref 0.61–1.24)
Creatinine, Ser: 0.6 mg/dL — ABNORMAL LOW (ref 0.61–1.24)
Creatinine, Ser: 0.6 mg/dL — ABNORMAL LOW (ref 0.61–1.24)
Creatinine, Ser: 0.5 mg/dL — ABNORMAL LOW (ref 0.61–1.24)
Glucose, Bld: 111 mg/dL — ABNORMAL HIGH (ref 70–99)
Glucose, Bld: 104 mg/dL — ABNORMAL HIGH (ref 70–99)
Glucose, Bld: 94 mg/dL (ref 70–99)
Glucose, Bld: 146 mg/dL — ABNORMAL HIGH (ref 70–99)
Glucose, Bld: 128 mg/dL — ABNORMAL HIGH (ref 70–99)
HCT: 42 % (ref 39.0–52.0)
HCT: 28 % — ABNORMAL LOW (ref 39.0–52.0)
HCT: 36 % — ABNORMAL LOW (ref 39.0–52.0)
HCT: 30 % — ABNORMAL LOW (ref 39.0–52.0)
HCT: 28 % — ABNORMAL LOW (ref 39.0–52.0)
Hemoglobin: 14.3 g/dL (ref 13.0–17.0)
Hemoglobin: 12.2 g/dL — ABNORMAL LOW (ref 13.0–17.0)
Hemoglobin: 9.5 g/dL — ABNORMAL LOW (ref 13.0–17.0)
Hemoglobin: 10.2 g/dL — ABNORMAL LOW (ref 13.0–17.0)
Hemoglobin: 9.5 g/dL — ABNORMAL LOW (ref 13.0–17.0)
Potassium: 4 mmol/L (ref 3.5–5.1)
Potassium: 4 mmol/L (ref 3.5–5.1)
Potassium: 4.3 mmol/L (ref 3.5–5.1)
Potassium: 4.3 mmol/L (ref 3.5–5.1)
Potassium: 3.8 mmol/L (ref 3.5–5.1)
Sodium: 141 mmol/L (ref 135–145)
Sodium: 140 mmol/L (ref 135–145)
Sodium: 141 mmol/L (ref 135–145)
Sodium: 137 mmol/L (ref 135–145)
Sodium: 140 mmol/L (ref 135–145)
TCO2: 27 mmol/L (ref 22–32)
TCO2: 24 mmol/L (ref 22–32)
TCO2: 28 mmol/L (ref 22–32)
TCO2: 24 mmol/L (ref 22–32)
TCO2: 26 mmol/L (ref 22–32)

## 2019-10-06 LAB — CBC
HCT: 29.7 % — ABNORMAL LOW (ref 39.0–52.0)
HCT: 30.7 % — ABNORMAL LOW (ref 39.0–52.0)
Hemoglobin: 10 g/dL — ABNORMAL LOW (ref 13.0–17.0)
Hemoglobin: 10.5 g/dL — ABNORMAL LOW (ref 13.0–17.0)
MCH: 30.6 pg (ref 26.0–34.0)
MCH: 31.1 pg (ref 26.0–34.0)
MCHC: 33.7 g/dL (ref 30.0–36.0)
MCHC: 34.2 g/dL (ref 30.0–36.0)
MCV: 90.8 fL (ref 80.0–100.0)
MCV: 90.8 fL (ref 80.0–100.0)
Platelets: 147 10*3/uL — ABNORMAL LOW (ref 150–400)
Platelets: 168 10*3/uL (ref 150–400)
RBC: 3.27 MIL/uL — ABNORMAL LOW (ref 4.22–5.81)
RBC: 3.38 MIL/uL — ABNORMAL LOW (ref 4.22–5.81)
RDW: 12.7 % (ref 11.5–15.5)
RDW: 12.7 % (ref 11.5–15.5)
WBC: 14.6 10*3/uL — ABNORMAL HIGH (ref 4.0–10.5)
WBC: 16.3 10*3/uL — ABNORMAL HIGH (ref 4.0–10.5)
nRBC: 0 % (ref 0.0–0.2)
nRBC: 0 % (ref 0.0–0.2)

## 2019-10-06 LAB — BASIC METABOLIC PANEL
Anion gap: 8 (ref 5–15)
BUN: 13 mg/dL (ref 8–23)
CO2: 22 mmol/L (ref 22–32)
Calcium: 8.1 mg/dL — ABNORMAL LOW (ref 8.9–10.3)
Chloride: 102 mmol/L (ref 98–111)
Creatinine, Ser: 0.92 mg/dL (ref 0.61–1.24)
GFR calc Af Amer: >60 mL/min (ref >60)
GFR calc non Af Amer: >60 mL/min (ref >60)
Glucose, Bld: 132 mg/dL — ABNORMAL HIGH (ref 70–99)
Potassium: 3.6 mmol/L (ref 3.5–5.1)
Sodium: 132 mmol/L — ABNORMAL LOW (ref 135–145)

## 2019-10-06 LAB — GLUCOSE, CAPILLARY
Glucose-Capillary: 122 mg/dL — ABNORMAL HIGH (ref 70–99)
Glucose-Capillary: 123 mg/dL — ABNORMAL HIGH (ref 70–99)
Glucose-Capillary: 131 mg/dL — ABNORMAL HIGH (ref 70–99)
Glucose-Capillary: 158 mg/dL — ABNORMAL HIGH (ref 70–99)
Glucose-Capillary: 130 mg/dL — ABNORMAL HIGH (ref 70–99)

## 2019-10-06 MED ORDER — FUROSEMIDE 10 MG/ML IJ SOLN
40.0000 mg | Freq: Every day | INTRAMUSCULAR | Status: DC
  Administered 2019-10-07 – 2019-10-08 (×2): 40 mg via INTRAVENOUS
  Filled 2019-10-06 (×2): qty 4

## 2019-10-06 MED ORDER — METOPROLOL TARTRATE 25 MG/10 ML ORAL SUSPENSION
12.5000 mg | Freq: Two times a day (BID) | ORAL | Status: DC
  Filled 2019-10-06 (×2): qty 5

## 2019-10-06 MED ORDER — METOPROLOL TARTRATE 25 MG PO TABS
25.0000 mg | ORAL_TABLET | Freq: Two times a day (BID) | ORAL | Status: DC
  Administered 2019-10-06 – 2019-10-07 (×3): 25 mg via ORAL
  Filled 2019-10-06 (×3): qty 1

## 2019-10-06 MED ORDER — SODIUM CHLORIDE 0.9% FLUSH
3.0000 mL | INTRAVENOUS | Status: DC | PRN
  Administered 2019-10-06: 3 mL via INTRAVENOUS

## 2019-10-06 MED ORDER — FUROSEMIDE 10 MG/ML IJ SOLN
40.0000 mg | Freq: Two times a day (BID) | INTRAMUSCULAR | Status: DC
  Administered 2019-10-06: 40 mg via INTRAVENOUS
  Filled 2019-10-06: qty 4

## 2019-10-06 MED ORDER — INSULIN ASPART 100 UNIT/ML ~~LOC~~ SOLN
0.0000 [IU] | Freq: Three times a day (TID) | SUBCUTANEOUS | Status: DC
  Administered 2019-10-06 (×2): 2 [IU] via SUBCUTANEOUS
  Administered 2019-10-07: 4 [IU] via SUBCUTANEOUS
  Administered 2019-10-07 – 2019-10-08 (×4): 2 [IU] via SUBCUTANEOUS

## 2019-10-06 MED ORDER — AMIODARONE HCL IN DEXTROSE 360-4.14 MG/200ML-% IV SOLN
30.0000 mg/h | INTRAVENOUS | Status: DC
  Administered 2019-10-06 – 2019-10-07 (×2): 30 mg/h via INTRAVENOUS
  Filled 2019-10-06: qty 200

## 2019-10-06 MED ORDER — SODIUM CHLORIDE 0.9 % IV SOLN: 250.0000 mL | INTRAVENOUS | Status: DC | PRN

## 2019-10-06 MED ORDER — AMIODARONE HCL IN DEXTROSE 360-4.14 MG/200ML-% IV SOLN
60.0000 mg/h | INTRAVENOUS | Status: AC
  Administered 2019-10-06: 60 mg/h via INTRAVENOUS
  Filled 2019-10-06 (×2): qty 200

## 2019-10-06 MED ORDER — POTASSIUM CHLORIDE ER 10 MEQ PO TBCR
20.0000 meq | EXTENDED_RELEASE_TABLET | Freq: Once | ORAL | Status: AC
  Administered 2019-10-06: 20 meq via ORAL
  Filled 2019-10-06 (×2): qty 2

## 2019-10-06 MED ORDER — SODIUM CHLORIDE 0.9% FLUSH
3.0000 mL | Freq: Two times a day (BID) | INTRAVENOUS | Status: DC
  Administered 2019-10-06 – 2019-10-11 (×10): 3 mL via INTRAVENOUS

## 2019-10-06 MED ORDER — ~~LOC~~ CARDIAC SURGERY, PATIENT & FAMILY EDUCATION: Freq: Once | Status: AC

## 2019-10-06 MED ORDER — AMIODARONE IV BOLUS ONLY 150 MG/100ML
150.0000 mg | Freq: Once | INTRAVENOUS | Status: AC
  Administered 2019-10-06: 150 mg via INTRAVENOUS
  Filled 2019-10-06: qty 100

## 2019-10-06 MED ORDER — POTASSIUM CHLORIDE CRYS ER 20 MEQ PO TBCR
20.0000 meq | EXTENDED_RELEASE_TABLET | Freq: Two times a day (BID) | ORAL | Status: DC
  Administered 2019-10-06 – 2019-10-07 (×5): 20 meq via ORAL
  Filled 2019-10-06 (×4): qty 1

## 2019-10-06 MED ORDER — METOLAZONE 5 MG PO TABS
5.0000 mg | ORAL_TABLET | Freq: Every day | ORAL | Status: AC
  Administered 2019-10-06 – 2019-10-08 (×3): 5 mg via ORAL
  Filled 2019-10-06 (×3): qty 1

## 2019-10-06 MED ORDER — AMIODARONE HCL 200 MG PO TABS
400.0000 mg | ORAL_TABLET | Freq: Two times a day (BID) | ORAL | Status: DC
  Administered 2019-10-06: 400 mg via ORAL
  Filled 2019-10-06: qty 2

## 2019-10-06 MED FILL — Electrolyte-R (PH 7.4) Solution: INTRAVENOUS | Qty: 5000 | Status: AC

## 2019-10-06 MED FILL — Heparin Sodium (Porcine) Inj 1000 Unit/ML: INTRAMUSCULAR | Qty: 10 | Status: AC

## 2019-10-06 MED FILL — Sodium Chloride IV Soln 0.9%: INTRAVENOUS | Qty: 2000 | Status: AC

## 2019-10-06 MED FILL — Potassium Chloride Inj 2 mEq/ML: INTRAVENOUS | Qty: 40 | Status: AC

## 2019-10-06 MED FILL — Mannitol IV Soln 20%: INTRAVENOUS | Qty: 500 | Status: AC

## 2019-10-06 MED FILL — Lidocaine HCl Local Soln Prefilled Syringe 100 MG/5ML (2%): INTRAMUSCULAR | Qty: 5 | Status: AC

## 2019-10-06 MED FILL — Magnesium Sulfate Inj 50%: INTRAMUSCULAR | Qty: 10 | Status: AC

## 2019-10-06 MED FILL — Heparin Sodium (Porcine) Inj 1000 Unit/ML: INTRAMUSCULAR | Qty: 30 | Status: AC

## 2019-10-06 MED FILL — Sodium Bicarbonate IV Soln 8.4%: INTRAVENOUS | Qty: 50 | Status: AC

## 2019-10-06 NOTE — Progress Notes (Signed)
Mobility Specialist: Progress Note    10/06/19 1451  Mobility  Activity Ambulated in room  Level of Assistance Modified independent, requires aide device or extra time  Assistive Device Front wheel walker  Distance Ambulated (ft) 15 ft  Mobility Response Tolerated fair  Mobility performed by Mobility specialist  $Mobility charge 1 Mobility   Post-Mobility: 151 HR, 89/77 BP, 100% SpO2  Pt was in bathroom upon entering room. Pt had a HR in the 150s and was in afib. Pt c/o feeling nauseated. I transferred pt to the bed and a nurse came into the room. Will not work with pt more today.   [facility] Nicholi Ghuman Mobility Specialist

## 2019-10-06 NOTE — Progress Notes (Signed)
Nursing called stating patient got up to use the bathroom and developed Rapid Atrial Fibrillation with RVR.  His blood pressure is stable.    He was placed back in bed.  She treated with IV Lopressor w/o improvement of HR.    Plan:  Patient only has peripheral IV access.  We will give a 150 mg IV Bolus of Amiodarone.  His oral medication was increased to 400 mg BID.  If he remains in Atrial Fibrillation with elevated rates we will have to start him on Amiodarone drip.  Ellwood Handler, PA-C

## 2019-10-06 NOTE — Progress Notes (Signed)
Anesthesiology Follow-up:  Awake and alert, neuro intact, sitting in chair, minimal pain, now on 4E.  VS: T- 36.8 BP 107/59 HR- 85 (SR) RR- 16 O2 Sat 97% on RA  K- 3.2 glucose-119 BUN/Cr.- 13/1.16 H/H- 10.5/30.7 Platelets- 39,76  74 year old male with accelerating angina and L. Main and proximal LAD disease with preserved LV function, now 2 days S/P CABG X 3. Extubated 6 hours post-op. No apparent complications.  Roberts Gaudy, MD

## 2019-10-06 NOTE — H&P (Signed)
PCP is Street, Sharon Mt, MD Referring Provider is No ref. provider found  No chief complaint on file.  Patient presents with class III exertional angina with documented significant left main and three-vessel coronary artery disease by recent cardiac cath.  LV function by echocardiogram is normal. The patient has been recommended for coronary bypass grafting by his cardiologist and presents for evaluation for surgery.  HPI: Patient evaluated and images of cardiac catheterization and echocardiogram personally reviewed and discussed with the patient and wife The patient has history of paroxysmal atrial fibrillation for over 10 years with her episodes of arrhythmia documented.  He is taking aspirin and flecainide.  Developed anginal chest pain and after a cardiac CT scan was evaluated with cardiac catheterization by Dr. Daneen Schick.  This demonstrated 75% stenosis of the left main and high-grade ostial RCA stenosis.  LV function is normal by echo.  Patient's risk factors include positive family history, dyslipidemia, and mild obesity.  He is not smoking he is not a diabetic.  Past Medical History:  Diagnosis Date  . Arthritis 01/15/2017  . Atrial fibrillation ([place])   . Cancer ([place])    skin cancer  . Chest mass 12/04/2015  . Coronary artery disease   . Cutaneous abscess of chest wall 12/04/2015  . Dyspnea   . Dysrhythmia    afib  . Epidermoid cyst of skin 12/04/2015  . GERD (gastroesophageal reflux disease) 01/15/2017  . High risk medication use 01/10/2015   Overview:  Overview:  flecanide  . Hyperlipidemia 01/15/2017  . LAE (left atrial enlargement) 01/15/2017  . Open wound of chest wall 04/01/2016  . PAF (paroxysmal atrial fibrillation) ([place]) 01/10/2015   Overview:  CHADS2 vasc score=1    Past Surgical History:  Procedure Laterality Date  . APPENDECTOMY    . CARDIAC CATHETERIZATION     09/23/19  . CHOLECYSTECTOMY    . CORONARY ARTERY BYPASS GRAFT N/A 10/04/2019   Procedure: CORONARY  ARTERY BYPASS GRAFTING (CABG) times three using right greater saphenous vein and left internal mammary ;  Surgeon: Ivin Poot, MD;  Location: [place];  Service: Open Heart Surgery;  Laterality: N/A;  . CYST EXCISION    . ENDOVEIN HARVEST OF GREATER SAPHENOUS VEIN Right 10/04/2019   Procedure: [name] Of Greater Saphenous Vein;  Surgeon: Ivin Poot, MD;  Location: [place];  Service: Open Heart Surgery;  Laterality: Right;  . EXCISION OF KELOID N/A 10/04/2019   Procedure: Excision Of Sternal Keloid;  Surgeon: Ivin Poot, MD;  Location: [place];  Service: Open Heart Surgery;  Laterality: N/A;  . LAPAROSCOPIC LYSIS INTESTINAL ADHESIONS  2012  . LEFT HEART CATH AND CORONARY ANGIOGRAPHY N/A 09/23/2019   Procedure: LEFT HEART CATH AND CORONARY ANGIOGRAPHY;  Surgeon: Belva Crome, MD;  Location: [place] [facility];  Service: Cardiovascular;  Laterality: N/A;  . TEE WITHOUT CARDIOVERSION N/A 10/04/2019   Procedure: TRANSESOPHAGEAL ECHOCARDIOGRAM (TEE);  Surgeon: Prescott Gum, Collier Salina, MD;  Location: [place];  Service: Open Heart Surgery;  Laterality: N/A;    Family History  Problem Relation Age of Onset  . Heart disease Father   . Heart failure Father     Social History Social History   Tobacco Use  . Smoking status: Former Smoker    Years: 20.00    Types: Cigarettes    Quit date: 01/10/1979    Years since quitting: 40.7  . Smokeless tobacco: Never Used  Vaping Use  . Vaping Use: Never used  Substance Use  Topics  . Alcohol use: No  . Drug use: No    Current Facility-Administered Medications  Medication Dose Route Frequency Provider Last Rate Last Admin  . 0.45 % sodium chloride infusion   Intravenous Continuous PRN Conte, Tessa N, PA-C      . 0.9 %  sodium chloride infusion (Manually program via Guardrails IV Fluids)   Intravenous Once [place], CRNA      . 0.9 %  sodium chloride infusion  250 mL Intravenous Continuous Conte, Tessa N, PA-C      . 0.9 %  sodium  chloride infusion   Intravenous Continuous Elgie Collard, PA-C 20 mL/hr at 10/05/19 0400 Rate Verify at 10/05/19 0400  . 0.9 %  sodium chloride infusion  250 mL Intravenous PRN Ivin Poot, MD      . acetaminophen (TYLENOL) tablet 1,000 mg  1,000 mg Oral Q6H Harriet Pho, Tessa N, PA-C   1,000 mg at 10/06/19 1353   Or  . acetaminophen (TYLENOL) 160 MG/5ML solution 1,000 mg  1,000 mg Per Tube Q6H Conte, Tessa N, PA-C   1,000 mg at 10/04/19 2337  . amiodarone (NEXTERONE PREMIX) 360-4.14 MG/200ML-% (1.8 mg/mL) IV infusion  60 mg/hr Intravenous Continuous Prescott Gum, Collier Salina, MD 33.3 mL/hr at 10/06/19 1644 60 mg/hr at 10/06/19 1644  . amiodarone (NEXTERONE PREMIX) 360-4.14 MG/200ML-% (1.8 mg/mL) IV infusion  30 mg/hr Intravenous Continuous Prescott Gum, Collier Salina, MD      . aspirin EC tablet 325 mg  325 mg Oral Daily Nicholes Rough N, [place]   325 mg at 10/06/19 1033   Or  . aspirin chewable tablet 324 mg  324 mg Per Tube Daily Conte, Tessa N, PA-C      . bisacodyl (DULCOLAX) EC tablet 10 mg  10 mg Oral Daily Conte, Tessa N, PA-C   10 mg at 10/06/19 1033   Or  . bisacodyl (DULCOLAX) suppository 10 mg  10 mg Rectal Daily Conte, Tessa N, PA-C      . chlorhexidine gluconate (MEDLINE KIT) (PERIDEX) 0.12 % solution 15 mL  15 mL Mouth Rinse BID Prescott Gum, Collier Salina, MD   15 mL at 10/06/19 0853  . Chlorhexidine Gluconate Cloth 2 % PADS 6 each  6 each Topical Q0600 Ivin Poot, MD   6 each at 10/06/19 0546  . docusate sodium (COLACE) capsule 200 mg  200 mg Oral Daily Nicholes Rough N, PA-C   200 mg at 10/06/19 1033  . [START ON 10/07/2019] furosemide (LASIX) injection 40 mg  40 mg Intravenous Daily Prescott Gum, Collier Salina, MD      . insulin aspart (novoLOG) injection 0-24 Units  0-24 Units Subcutaneous TID AC & HS Ivin Poot, MD   2 Units at 10/06/19 1658  . lactated ringers infusion   Intravenous Continuous Conte, Tessa N, PA-C      . lactated ringers infusion   Intravenous Continuous Elgie Collard, [place]   Stopped at  10/05/19 1621  . MEDLINE mouth rinse  15 mL Mouth Rinse BID Ivin Poot, MD   15 mL at 10/05/19 2023  . metoCLOPramide (REGLAN) injection 10 mg  10 mg Intravenous Q6H Ivin Poot, MD   10 mg at 10/06/19 1644  . metolazone (ZAROXOLYN) tablet 5 mg  5 mg Oral Daily Prescott Gum, Collier Salina, MD   5 mg at 10/06/19 0835  . metoprolol tartrate (LOPRESSOR) tablet 25 mg  25 mg Oral BID Ivin Poot, MD       Or  .  metoprolol tartrate (LOPRESSOR) 25 mg/10 mL oral suspension 12.5 mg  12.5 mg Per Tube BID Prescott Gum, Collier Salina, MD      . metoprolol tartrate (LOPRESSOR) injection 2.5-5 mg  2.5-5 mg Intravenous Q2H PRN Harriet Pho, Tessa N, PA-C   5 mg at 10/06/19 1542  . midazolam (VERSED) injection 2 mg  2 mg Intravenous Q4H PRN Prescott Gum, Collier Salina, MD      . ondansetron [facility]) injection 4 mg  4 mg Intravenous Q6H PRN Elgie Collard, PA-C   4 mg at 10/05/19 1448  . oxyCODONE (Oxy IR/ROXICODONE) immediate release tablet 5-10 mg  5-10 mg Oral Q3H PRN Nicholes Rough N, PA-C   10 mg at 10/06/19 1542  . pantoprazole (PROTONIX) EC tablet 40 mg  40 mg Oral Daily Elgie Collard, PA-C   40 mg at 10/06/19 1033  . potassium chloride SA (KLOR-CON) CR tablet 20 mEq  20 mEq Oral BID Prescott Gum, Collier Salina, MD   20 mEq at 10/06/19 1033  . simvastatin (ZOCOR) tablet 40 mg  40 mg Oral Daily Elgie Collard, PA-C   40 mg at 10/06/19 1033  . sodium chloride flush (NS) 0.9 % injection 3 mL  3 mL Intravenous Q12H Conte, Tessa N, PA-C   3 mL at 10/06/19 1039  . sodium chloride flush (NS) 0.9 % injection 3 mL  3 mL Intravenous PRN Harriet Pho, Tessa N, PA-C      . sodium chloride flush (NS) 0.9 % injection 3 mL  3 mL Intravenous Q12H Prescott Gum, Collier Salina, MD   3 mL at 10/06/19 1040  . sodium chloride flush (NS) 0.9 % injection 3 mL  3 mL Intravenous PRN Prescott Gum, Collier Salina, MD   3 mL at 10/06/19 1039  . traMADol (ULTRAM) tablet 50-100 mg  50-100 mg Oral Q4H PRN Elgie Collard, PA-C        Allergies  Allergen Reactions  . Penicillins Hives    In his 20's     Review of Systems   Patient had a large sebaceous cyst in the area of his lower sternum which was treated with incision and drainage several years ago.  It is now a scarred area without active infection or cellulitis. he is right-hand dominant. History of thoracic trauma or rib fractures No history of DVT or phlebitis.                    Review of Systems :  [ y ] = yes, [  ] = no        General :  Weight gain [   ]    Weight loss  [   ]  Fatigue Blue.Reese  ]  Fever [  ]  Chills  [  ]                                          HEENT    Headache [  ]  Dizziness [  ]  Blurred vision [  ] Glaucoma  [  ]                          Nosebleeds [  ] Painful or loose teeth [  ]        Cardiac :  Chest pain/ pressure [ y ]  Resting SOB [  ] exertional SOB [  ]  Orthopnea [  ]  Pedal edema  [  ]  Palpitations [  ] Syncope/presyncope _0                         Paroxysmal nocturnal dyspnea [  ]         Pulmonary : cough [  ]  wheezing [  ]  Hemoptysis [  ] Sputum [  ] Snoring [  ]                              Pneumothorax [  ]  Sleep apnea [  ]        GI : Vomiting [  ]  Dysphagia [  ]  Melena  [  ]  Abdominal pain [  ] BRBPR [  ]              Heart burn [  ]  Constipation [  ] Diarrhea  [  ] Colonoscopy [   ]        GU : Hematuria [  ]  Dysuria [  ]  Nocturia [  ] UTI's [  ]        Vascular : Claudication [  ]  Rest pain [  ]  DVT [  ] Vein stripping [  ] leg ulcers [  ]                          TIA [  ] Stroke [  ]  Varicose veins [  ]        NEURO :  Headaches  [  ] Seizures [  ] Vision changes [  ] Paresthesias [  ]                                               Musculoskeletal :  Arthritis [  ] Gout  [  ]  Back pain [  ]  Joint pain [  ]        Skin :  Rash [  ]  Melanoma [  ] Sores [  ]        Heme : Bleeding problems [  ]Clotting Disorders [  ] Anemia [  ]Blood Transfusion _1         Endocrine : Diabetes [  ] Heat or Cold intolerance [  ] Polyuria [  ]excessive  thirst _2         Psych : Depression [  ]  Anxiety [  ]  Psych hospitalizations [  ] Memory change [  ]                                                                            BP 112/68 (BP Location: Left Arm)   Pulse (!) 125   Temp 98.4 F (36.9 C) (Oral)   Resp 20   Ht 6' (1.829 m)   Wt 101.2 kg  SpO2 94%   BMI 30.26 kg/m       Physical Exam  General: Well-nourished 74 year old male no acute distress HEENT: Normocephalic pupils equal , dentition adequate Neck: Supple without JVD, adenopathy, or bruit Chest: Clear to auscultation, symmetrical breath sounds, no rhonchi, no tenderness             or deformity.  Large scarred area of skin with keloid over lower sternal region. Cardiovascular: Regular rate and rhythm, no murmur, no gallop, peripheral pulses             palpable in all extremities Abdomen:  Soft, nontender, no palpable mass or organomegaly Extremities: Warm, well-perfused, no clubbing cyanosis edema or tenderness,              no venous stasis changes of the legs Rectal/GU: Deferred Neuro: Grossly non--focal and symmetrical throughout Skin: Clean and dry without rash or ulceration   Diagnostic Tests: Coronary angiogram and echocardiogram images reviewed showing normal LV function with significant left main and three-vessel CAD.  Impression: Class III exertional angina Hypertension and dyslipidemia Three-vessel coronary artery disease and left main stenosis with LV function  Plan: Patient will be scheduled for multivessel CABG on Tuesday, June 8 at [facility].  I discussed the benefits of surgery as well as the alternatives and the potential risks of MI, stroke, bleeding, infection, organ failure and death.  He agrees to proceed with surgery.   Len Childs, MD [facility] [phone]

## 2019-10-06 NOTE — Progress Notes (Signed)
Pt's MEWS noted to change to yellow.  Provider notified and frequent vital signs implemented.  New orders received.  Will monitor closely.

## 2019-10-06 NOTE — Progress Notes (Addendum)
      [address]       [place] [address]             [phone]      2 Days Post-Op Procedure(s) (LRB): CORONARY ARTERY BYPASS GRAFTING (CABG) times three using right greater saphenous vein and left internal mammary  (N/A) TRANSESOPHAGEAL ECHOCARDIOGRAM (TEE) (N/A) Excision Of Sternal Keloid (N/A) Endovein Harvest Of Greater Saphenous Vein (Right)   Subjective:  No specific complaints.  Nurse at bedside getting ready to remove chest tubes.    Objective: Vital signs in last 24 hours: Temp:  [97.7 F (36.5 C)-99 F (37.2 C)] 98.3 F (36.8 C) (06/10 0645) Pulse Rate:  [64-90] 86 (06/10 0600) Cardiac Rhythm: Normal sinus rhythm (06/10 0015) Resp:  [14-31] 22 (06/10 0600) BP: (104-143)/(55-101) 104/68 (06/10 0600) SpO2:  [91 %-100 %] 96 % (06/10 0600) Arterial Line BP: (126-154)/(44-51) 140/46 (06/09 1330) Weight:  [101.2 kg] 101.2 kg (06/10 0500)  Intake/Output from previous day: 06/09 0701 - 06/10 0700 In: 1403.2 [P.O.:1080; I.V.:173.3; IV Piggyback:149.9] Out: 2315 [Urine:1980; Chest Tube:335] Intake/Output this shift: Total I/O In: -  Out: 270 [Urine:150; Chest Tube:120]  General appearance: alert, cooperative and no distress Heart: regular rate and rhythm Lungs: clear to auscultation bilaterally Abdomen: soft, non-tender; bowel sounds normal; no masses,  no organomegaly Extremities: edema trace Wound: clean and dry  Lab Results: Recent Labs    10/06/19 0420 10/06/19 0600  WBC 14.6* 16.3*  HGB 10.0* 10.5*  HCT 29.7* 30.7*  PLT 147* 168   BMET:  Recent Labs    10/05/19 1622 10/06/19 0600  NA 136 132*  K 4.0 3.6  CL 107 102  CO2 22 22  GLUCOSE 144* 132*  BUN 11 13  CREATININE 0.83 0.92  CALCIUM 8.1* 8.1*    PT/INR:  Recent Labs    10/04/19 1507  LABPROT 16.9*  INR 1.4*   ABG    Component Value Date/Time   PHART 7.386 10/05/2019 0554   HCO3 21.6 10/05/2019 0554   TCO2 23 10/05/2019 0554   ACIDBASEDEF 3.0 (H)  10/05/2019 0554   O2SAT 94.0 10/05/2019 0554   CBG (last 3)  Recent Labs    10/05/19 2343 10/06/19 0433 10/06/19 0642  GLUCAP 132* 122* 123*    Assessment/Plan: S/P Procedure(s) (LRB): CORONARY ARTERY BYPASS GRAFTING (CABG) times three using right greater saphenous vein and left internal mammary  (N/A) TRANSESOPHAGEAL ECHOCARDIOGRAM (TEE) (N/A) Excision Of Sternal Keloid (N/A) Endovein Harvest Of Greater Saphenous Vein (Right)  1. CV- NSR, BP ranging 100-140s- continue Lopressor, if BP remains elevated may benefit from ACE/ARB 2. Pulm- CTs being removed by nurse while in room, CXR w/o pneumothorax, will repeat in AM 3. Renal- creatinine WNL, being aggressively diuresed with lasix, Metolazone, weight is trending down 4. Expected post operative blood loss anemia, mild 5. CBGs controlled, not a diabetic, will plan to stop SSIP tomorrow 6. Dispo- patient stable, continue Lopressor, can start ACE/ARB if BP continues to remain elevated, d/c chest tubes, transfer to 4E   LOS: 2 days    Ellwood Handler, PA-C 10/06/2019   patient examined and medical record reviewed,agree with above note. Tharon Aquas Trigt III 10/06/2019

## 2019-10-06 NOTE — Progress Notes (Signed)
Pt went to BR and HR increased 130-150. BP 142/82 (98). EKG. 5mg  IV metoprolol given. Erin Barrett PA-C paged and ordered received. Will continue to monitor.  Clyde Canterbury, RN

## 2019-10-07 ENCOUNTER — Inpatient Hospital Stay (HOSPITAL_COMMUNITY): Payer: Medicare Other

## 2019-10-07 LAB — GLUCOSE, CAPILLARY
Glucose-Capillary: 131 mg/dL — ABNORMAL HIGH (ref 70–99)
Glucose-Capillary: 161 mg/dL — ABNORMAL HIGH (ref 70–99)
Glucose-Capillary: 132 mg/dL — ABNORMAL HIGH (ref 70–99)
Glucose-Capillary: 143 mg/dL — ABNORMAL HIGH (ref 70–99)

## 2019-10-07 LAB — CBC
HCT: 31.2 % — ABNORMAL LOW (ref 39.0–52.0)
Hemoglobin: 10.9 g/dL — ABNORMAL LOW (ref 13.0–17.0)
MCH: 31.2 pg (ref 26.0–34.0)
MCHC: 34.9 g/dL (ref 30.0–36.0)
MCV: 89.4 fL (ref 80.0–100.0)
Platelets: 193 10*3/uL (ref 150–400)
RBC: 3.49 MIL/uL — ABNORMAL LOW (ref 4.22–5.81)
RDW: 12.5 % (ref 11.5–15.5)
WBC: 19.2 10*3/uL — ABNORMAL HIGH (ref 4.0–10.5)
nRBC: 0 % (ref 0.0–0.2)

## 2019-10-07 LAB — BASIC METABOLIC PANEL
Anion gap: 10 (ref 5–15)
BUN: 14 mg/dL (ref 8–23)
CO2: 25 mmol/L (ref 22–32)
Calcium: 8.3 mg/dL — ABNORMAL LOW (ref 8.9–10.3)
Chloride: 94 mmol/L — ABNORMAL LOW (ref 98–111)
Creatinine, Ser: 0.86 mg/dL (ref 0.61–1.24)
GFR calc Af Amer: >60 mL/min (ref >60)
GFR calc non Af Amer: >60 mL/min (ref >60)
Glucose, Bld: 143 mg/dL — ABNORMAL HIGH (ref 70–99)
Potassium: 3.7 mmol/L (ref 3.5–5.1)
Sodium: 129 mmol/L — ABNORMAL LOW (ref 135–145)

## 2019-10-07 MED ORDER — AMIODARONE HCL 200 MG PO TABS
400.0000 mg | ORAL_TABLET | Freq: Two times a day (BID) | ORAL | Status: DC
  Administered 2019-10-07 – 2019-10-12 (×11): 400 mg via ORAL
  Filled 2019-10-07 (×11): qty 2

## 2019-10-07 MED ORDER — POTASSIUM CHLORIDE ER 10 MEQ PO TBCR
20.0000 meq | EXTENDED_RELEASE_TABLET | Freq: Once | ORAL | Status: AC
  Administered 2019-10-07: 20 meq via ORAL
  Filled 2019-10-07 (×2): qty 2

## 2019-10-07 MED ORDER — SORBITOL 70 % SOLN
60.0000 mL | Freq: Once | Status: AC
  Administered 2019-10-07: 60 mL via ORAL
  Filled 2019-10-07: qty 60

## 2019-10-07 NOTE — Progress Notes (Signed)
Mobility Specialist - Progress Note   10/07/19 1705  Mobility  Activity Ambulated in hall  Level of Assistance Modified independent, requires aide device or extra time  Assistive Device Front wheel walker  Distance Ambulated (ft) 360 ft  Mobility Response Tolerated well  Mobility performed by Mobility specialist  $Mobility charge 1 Mobility    Pre-mobility: 86 HR, 150/83 BP, 93% SpO2 During mobility: 93 HR, 95% SpO2 Post-mobility: 83 HR, 149/65 BP, 97% SpO2  Pt ambulated on RA.  [place] Specialist

## 2019-10-07 NOTE — Anesthesia Postprocedure Evaluation (Signed)
Anesthesia Post Note  Patient: Steve Frye  Procedure(s) Performed: CORONARY ARTERY BYPASS GRAFTING (CABG) times three using right greater saphenous vein and left internal mammary  (N/A Chest) TRANSESOPHAGEAL ECHOCARDIOGRAM (TEE) (N/A ) Excision Of Sternal Keloid (N/A ) Endovein Harvest Of Greater Saphenous Vein (Right )     Patient location during evaluation: SICU Anesthesia Type: General Level of consciousness: sedated and patient remains intubated per anesthesia plan Pain management: pain level controlled Vital Signs Assessment: post-procedure vital signs reviewed and stable Respiratory status: patient remains intubated per anesthesia plan and patient on ventilator - see flowsheet for VS Cardiovascular status: stable Postop Assessment: no apparent nausea or vomiting Anesthetic complications: no   No complications documented.  Last Vitals:  Vitals:   10/07/19 0529 10/07/19 0551  BP: 140/63   Pulse: 79 86  Resp: 19 (!) 24  Temp: 36.7 C   SpO2: 96% 96%    Last Pain:  Vitals:   10/07/19 0529  TempSrc: Oral  PainSc:                  [name]

## 2019-10-07 NOTE — Discharge Summary (Addendum)
[address]       [place] [address]             [phone]      Physician Discharge Summary  Patient ID: Steve Frye MRN: [id number] DOB/AGE: October 20, 1945 74 y.o.  Admit date: 10/04/2019 Discharge date: 10/12/2019  Admission Diagnoses:  Patient Active Problem List   Diagnosis Date Noted   Acute coronary syndrome ([place]) 09/28/2019   Coronary artery disease of native artery of native heart with stable angina pectoris ([place])    Arthritis 01/15/2017   GERD (gastroesophageal reflux disease) 01/15/2017   Hyperlipidemia 01/15/2017   LAE (left atrial enlargement) 01/15/2017   Open wound of chest wall 04/01/2016   Chest mass 12/04/2015   Cutaneous abscess of chest wall 12/04/2015   Keloid scar [large] over lower sternum  12/04/2015   High risk medication use 01/10/2015   PAF (paroxysmal atrial fibrillation) ([facility]) 01/10/2015    Discharge Diagnoses:  Active Problems:   S/P CABG x 3   Discharged Condition: good  HPI:   Patient evaluated and images of cardiac catheterization and echocardiogram personally reviewed and discussed with the patient and wife The patient has history of paroxysmal atrial fibrillation for over 10 years with her episodes of arrhythmia documented.  He is taking aspirin and flecainide.  Developed anginal chest pain and after a cardiac CT scan was evaluated with cardiac catheterization by Dr. Daneen Schick.  This demonstrated 75% stenosis of the left main and high-grade ostial RCA stenosis.  LV function is normal by echo.   Patient's risk factors include positive family history, dyslipidemia, and mild obesity.  He is not a smoker and he is not a diabetic.   Hospital Course:    Steve Frye underwent a coronary artery bypass grafting x3 with Dr. Prescott Gum.  He tolerated the procedure well and was transferred to the surgical ICU.  He was extubated in a timely manner. We began a diuretic regimen for fluid overload. We started PO Amio for remote  afib. POD 2 he continued to progress. We continued to encouraged ambulation and it was safe to transfer the patient to the floor. Once on the floor, he went into afib with RVR. After trying an Amio bolus and a few doses of IV Lopresor, the patient did not convert. We then started him on an Amio drip and he converted. The patient remained in NSR for > 48 hours. We continued a diuretic regimen and continued to replace his electrolytes. His EPW were removed on 10/09/2019. Today, he is tolerating room air, he is ambulating in the halls independently without issue, and his incision is healing well. He is ready for discharge home. He will come back to the office for his sternal incision sutures removal.   Consults: None  Significant Diagnostic Studies:  CLINICAL DATA:  Status post CABG on 10/04/2019.   EXAM: CHEST - 2 VIEW   COMPARISON:  10/06/2019   FINDINGS: Stable enlarged cardiac silhouette and post CABG changes. Interval removal of left chest tube with no pneumothorax seen. Mild linear atelectasis in both mid and lower lung zones. Small bilateral pleural effusions. Unremarkable bones.   IMPRESSION: 1. Small bilateral pleural effusions. 2. Mild bilateral atelectasis. 3. Stable cardiomegaly.     Electronically Signed   By: Claudie Revering M.D.   On: 10/07/2019 09:48  Treatments:  NAME: DAARON, DIMARCO MEDICAL RECORD DX:[id number] ACCOUNT [id number] DATE OF BIRTH:1945-06-11 FACILITY: [facility] LOCATION: [facility] PHYSICIAN:Ladislao Cohenour VAN TRIGT III, MD  OPERATIVE REPORT   DATE OF PROCEDURE:  10/04/2019   OPERATIONS: 1.  Coronary artery bypass grafting x3 (left internal mammary artery to left anterior descending, saphenous vein graft to OM1, saphenous vein graft to right coronary artery). 2.  Endoscopic harvest of right leg greater saphenous vein. 3.  Excision of large keloid scar of the lower midsternum, thoracic region.   SURGEON:  Ivin Poot, MD   ASSISTANT:  Nicholes Rough, PA-C.     ANESTHESIA:  General by Dr. Roberts Gaudy.   PREOPERATIVE DIAGNOSIS:  Left main and 3-vessel coronary disease with class III accelerating angina.   POSTOPERATIVE DIAGNOSES:  Left main and 3-vessel coronary disease with class III accelerating angina.  Discharge Exam: Blood pressure (!) 124/54, pulse 71, temperature 99 F (37.2 C), temperature source Oral, resp. rate 19, height 6' (1.829 m), weight 95.8 kg, SpO2 96 %.   General appearance: alert, cooperative and no distress Heart: regular rate and rhythm, S1, S2 normal, no murmur, click, rub or gallop Lungs: clear to auscultation bilaterally Abdomen: soft, non-tender; bowel sounds normal; no masses,  no organomegaly Extremities:1+ pitting edema in lower ext Wound: clean and dry, sutures in place  Disposition:  Discharge disposition: 01-Home or Self Care       Discharge Instructions     Amb Referral to Cardiac Rehabilitation   Complete by: As directed    Diagnosis: CABG   CABG X ___: 3   After initial evaluation and assessments completed: Virtual Based Care may be provided alone or in conjunction with Phase 2 Cardiac Rehab based on patient barriers.: Yes      Allergies as of 10/12/2019       Reactions   Penicillins Hives   In his 20's        Medication List     STOP taking these medications    flecainide 50 MG tablet Commonly known as: TAMBOCOR   metoprolol succinate 50 MG 24 hr tablet Commonly known as: TOPROL-XL   nitroGLYCERIN 0.4 MG SL tablet Commonly known as: Nitrostat   oxymetazoline 0.05 % nasal spray Commonly known as: AFRIN       TAKE these medications    ALIGN PO Take 1 tablet by mouth daily.   amiodarone 200 MG tablet Commonly known as: PACERONE Take 1 tablet (200 mg total) by mouth 2 (two) times daily.   apixaban 5 MG Tabs tablet Commonly known as: ELIQUIS Take 1 tablet (5 mg total) by mouth 2 (two) times daily.   aspirin EC 81 MG tablet Take 81 mg daily by mouth.   famotidine  40 MG tablet Commonly known as: PEPCID TAKE 1 TABLET BY MOUTH EVERY DAY What changed:  how much to take how to take this when to take this additional instructions   finasteride 5 MG tablet Commonly known as: PROSCAR Take 5 mg daily by mouth.   FRUIT & VEGETABLE DAILY PO Take 2 tablets daily by mouth.   metoprolol tartrate 25 MG tablet Commonly known as: LOPRESSOR Take 1 tablet (25 mg total) by mouth 2 (two) times daily.   Nasacort Allergy 24HR 55 MCG/ACT Aero nasal inhaler Generic drug: triamcinolone Place 2 sprays into the nose at bedtime.   oxyCODONE 5 MG immediate release tablet Commonly known as: Oxy IR/ROXICODONE Take 1 tablet (5 mg total) by mouth every 6 (six) hours as needed for severe pain.   simvastatin 40 MG tablet Commonly known as: ZOCOR TAKE 1 TABLET BY MOUTH EVERY DAY What changed: when to take this  triamcinolone ointment 0.1 % Commonly known as: KENALOG Apply 1 application topically 3 (three) times daily as needed (Dry skin).               Durable Medical Equipment  (From admission, onward)           Start     Ordered   10/11/19 1425  For home use only DME 3 n 1  Once        10/11/19 1425   10/11/19 1425  For home use only DME Walker rolling  Once       Comments: Post op  Question Answer Comment  Walker: With 5 Inch Wheels   Patient needs a walker to treat with the following condition Weakness      10/11/19 Edna, Sharon Mt, MD. Call in 1 day(s).   Specialty: Family Medicine Contact information: [place] [address] [phone]         Ivin Poot, MD Follow up.   Specialty: Cardiothoracic Surgery Why: Your routine follow-up appointment is on 11/02/2019 at 3:30. Please arrive at 3:00PM for a chest xray located at [facility] which is on the first floor of our building Contact information: [address] [place]  [address] [phone]         Richardo Priest, MD Follow up.   Specialty: Cardiology Why: Follow-up appointment is scheduled for 6/30 at 4:00pm. Please bring your hospital paperwork Contact information: [place] [address] [phone]                The patient has been discharged on:   1.Beta Blocker:  Yes [ yes  ]                              No   [   ]                              If No, reason:  2.Ace Inhibitor/ARB: Yes [   ]                                     No  [  no  ]                                     If No, reason:  3.Statin:   Yes [ yes  ]                  No  [   ]                  If No, reason:  4.Ecasa:  Yes  [ yes  ]                  No   [   ]                  If No, reason:    Signed: Elgie Collard 10/12/2019, 8:02 AM  patient examined and medical record reviewed,agree with above note. Tharon Aquas Trigt III 10/13/2019  patient examined  and medical record reviewed,agree with above note. Tharon Aquas Trigt III 10/13/2019

## 2019-10-07 NOTE — Care Management Important Message (Signed)
Important Message  Patient Details  Name: Pasco Marchitto MRN: [id number] Date of Birth: 30-Jul-1945   Medicare Important Message Given:  Yes     Shelda Altes 10/07/2019, 10:01 AM

## 2019-10-07 NOTE — Progress Notes (Signed)
Mobility Specialist - Progress Note   10/07/19 1404  Mobility  Activity Ambulated in hall  Level of Assistance Modified independent, requires aide device or extra time  Assistive Device Front wheel walker  Distance Ambulated (ft) 340 ft  Mobility Response Tolerated well  Mobility performed by Mobility specialist  $Mobility charge 1 Mobility    Pre-mobility: 83 HR, 134/65 BP, 96% SpO2 During mobility: 88 HR, 97% SpO2 Post-mobility: 79 HR, 129/69 BP, 96% SpO2   Steve Frye Solicitor

## 2019-10-07 NOTE — Progress Notes (Signed)
CARDIAC REHAB PHASE I   PRE:  Rate/Rhythm: 80 SR  BP:  Sitting: 100/80      SaO2: 96 RA  MODE:  Ambulation: 230 ft   POST:  Rate/Rhythm: 101 ST  BP:  Sitting: 98/67    SaO2: 97 RA  Pt with weak cough and runny nose, encouraged bracing with heart pillow and getting a good cough in. Pt then ambulated 227ft in hallway assist of one with front wheel walker. Pt denies CP, SOB, or dizziness, does state some leg weakness. Encouraged continued IS use and walks, pt currently demonstrating 1000 on IS. Pt requesting walker and BSC for home use.   [phone] Rufina Falco, RN BSN 10/07/2019 11:11 AM

## 2019-10-07 NOTE — Progress Notes (Addendum)
[address]       [place] [address]             [phone]      3 Days Post-Op Procedure(s) (LRB): CORONARY ARTERY BYPASS GRAFTING (CABG) times three using right greater saphenous vein and left internal mammary  (N/A) TRANSESOPHAGEAL ECHOCARDIOGRAM (TEE) (N/A) Excision Of Sternal Keloid (N/A) Endovein Harvest Of Greater Saphenous Vein (Right) Subjective: Feels okay this morning, he did not ambulate yesterday due to his rhythm. His pain is well controlled.   Objective: Vital signs in last 24 hours: Temp:  [97.6 F (36.4 C)-98.4 F (36.9 C)] 97.6 F (36.4 C) (06/11 0829) Pulse Rate:  [77-138] 97 (06/11 0829) Cardiac Rhythm: Normal sinus rhythm (06/11 0809) Resp:  [16-24] 18 (06/11 0829) BP: (107-156)/(59-82) 124/61 (06/11 0829) SpO2:  [93 %-98 %] 97 % (06/11 0829) Weight:  [104.1 kg] 104.1 kg (06/11 0551)     Intake/Output from previous day: 06/10 0701 - 06/11 0700 In: 731.3 [P.O.:370; I.V.:361.3] Out: 1170 [Urine:1050; Chest Tube:120] Intake/Output this shift: No intake/output data recorded.  General appearance: alert, cooperative and no distress Heart: regular rate and rhythm, S1, S2 normal, no murmur, click, rub or gallop Lungs: clear to auscultation bilaterally Abdomen: soft, non-tender; bowel sounds normal; no masses,  no organomegaly Extremities: extremities normal, atraumatic, no cyanosis or edema Wound: clean and dry, dressing over interrupted sutures along the distal sternal incision.   Lab Results: Recent Labs    10/06/19 0600 10/07/19 0409  WBC 16.3* 19.2*  HGB 10.5* 10.9*  HCT 30.7* 31.2*  PLT 168 193   BMET:  Recent Labs    10/06/19 0600 10/07/19 0410  NA 132* 129*  K 3.6 3.7  CL 102 94*  CO2 22 25  GLUCOSE 132* 143*  BUN 13 14  CREATININE 0.92 0.86  CALCIUM 8.1* 8.3*    PT/INR:  Recent Labs    10/04/19 1507  LABPROT 16.9*  INR 1.4*   ABG    Component Value Date/Time   PHART 7.386 10/05/2019 0554    HCO3 21.6 10/05/2019 0554   TCO2 23 10/05/2019 0554   ACIDBASEDEF 3.0 (H) 10/05/2019 0554   O2SAT 94.0 10/05/2019 0554   CBG (last 3)  Recent Labs    10/06/19 1651 10/06/19 2139 10/07/19 0617  GLUCAP 158* 130* 131*    Assessment/Plan: S/P Procedure(s) (LRB): CORONARY ARTERY BYPASS GRAFTING (CABG) times three using right greater saphenous vein and left internal mammary  (N/A) TRANSESOPHAGEAL ECHOCARDIOGRAM (TEE) (N/A) Excision Of Sternal Keloid (N/A) Endovein Harvest Of Greater Saphenous Vein (Right)  1.CV- NSR in the 90s, BP stable. Amio bolus and drip for afib yesterday.  2. Pulm-tolerating room air with good oxygen saturation. 3. Renal-creatinine -0.86, potassium 3.7, will give extra dose of kdur 20MEQ. Continue diuretics for fluid overload.  4.H and H 10.9/31.2, expected acute blood loss anemia 5. Endo-blood glucose well controlled  Plan: 8lbs over baseline weight. Continue to diurese. Keep EPW for now while titration of medications. Will add Amio 400mg  PO and transition off Amio IV. Ambulate in the halls this morning. Continue incentive spirometer hourly.     LOS: 3 days    Elgie Collard 10/07/2019 Rapid atrial fibrillation resolved-we will transition to oral amiodarone. Abdomen distended with colonic ileus noted on chest x-ray today.  Sorbitol ordered. Wide excision of keloid in lower sternal region-skin incision appears to be healing.  ABBRA system removed and sternal dressing change personally. Plan continued care and wound observation  with discharge Sunday/Monday  patient examined and medical record reviewed,agree with above note. Tharon Aquas Trigt III 10/07/2019

## 2019-10-08 LAB — TYPE AND SCREEN
ABO/RH(D): A POS
Antibody Screen: NEGATIVE
Unit division: 0
Unit division: 0

## 2019-10-08 LAB — BPAM RBC
Blood Product Expiration Date: 202107092359
Blood Product Expiration Date: 202107092359
Unit Type and Rh: 5100
Unit Type and Rh: 5100

## 2019-10-08 LAB — GLUCOSE, CAPILLARY
Glucose-Capillary: 124 mg/dL — ABNORMAL HIGH (ref 70–99)
Glucose-Capillary: 148 mg/dL — ABNORMAL HIGH (ref 70–99)

## 2019-10-08 MED ORDER — POTASSIUM CHLORIDE CRYS ER 20 MEQ PO TBCR
40.0000 meq | EXTENDED_RELEASE_TABLET | Freq: Two times a day (BID) | ORAL | Status: DC
  Administered 2019-10-08 – 2019-10-10 (×5): 40 meq via ORAL
  Filled 2019-10-08 (×4): qty 2

## 2019-10-08 MED ORDER — MAGNESIUM OXIDE 400 (241.3 MG) MG PO TABS
400.0000 mg | ORAL_TABLET | Freq: Two times a day (BID) | ORAL | Status: DC
  Administered 2019-10-08 – 2019-10-12 (×9): 400 mg via ORAL
  Filled 2019-10-08 (×9): qty 1

## 2019-10-08 MED ORDER — METOPROLOL TARTRATE 25 MG PO TABS
37.5000 mg | ORAL_TABLET | Freq: Two times a day (BID) | ORAL | Status: DC
  Administered 2019-10-08 – 2019-10-09 (×4): 37.5 mg via ORAL
  Filled 2019-10-08 (×4): qty 1

## 2019-10-08 MED ORDER — AMIODARONE IV BOLUS ONLY 150 MG/100ML
150.0000 mg | Freq: Once | INTRAVENOUS | Status: AC
  Administered 2019-10-08: 150 mg via INTRAVENOUS
  Filled 2019-10-08: qty 100

## 2019-10-08 NOTE — Progress Notes (Signed)
Mobility Specialist - Progress Note   10/08/19 1054  Mobility  Activity Ambulated in hall  Level of Assistance Modified independent, requires aide device or extra time  Assistive Device Front wheel walker  Distance Ambulated (ft) 150 ft  Mobility Response Tolerated well  Mobility performed by Mobility specialist  $Mobility charge 1 Mobility    Pre-mobility: 75 HR,.129/72  BP, 96% SpO2 During mobility: 88 HR, 94% SpO2 Post-mobility: 83 HR, 106/63 BP, 96% SPO2  Pt c/o general weakness that remained unchanged during ambulation. His HR remained stable throughout ambulation.   [place] Specialist

## 2019-10-08 NOTE — Progress Notes (Addendum)
[address]       [place] [address]             [phone]      4 Days Post-Op Procedure(s) (LRB): CORONARY ARTERY BYPASS GRAFTING (CABG) times three using right greater saphenous vein and left internal mammary  (N/A) TRANSESOPHAGEAL ECHOCARDIOGRAM (TEE) (N/A) Excision Of Sternal Keloid (N/A) Endovein Harvest Of Greater Saphenous Vein (Right) Subjective: Currently in afib with RVR, 130-140's Belly a little better, + BM  Objective: Vital signs in last 24 hours: Temp:  [97.6 F (36.4 C)-98.7 F (37.1 C)] 98.1 F (36.7 C) (06/12 0437) Pulse Rate:  [78-97] 78 (06/12 0437) Cardiac Rhythm: Sinus tachycardia (06/11 2058) Resp:  [17-21] 21 (06/12 0437) BP: (95-143)/(57-71) 132/59 (06/12 0437) SpO2:  [96 %-99 %] 98 % (06/12 0437) Weight:  [103.2 kg] 103.2 kg (06/12 0437)  Hemodynamic parameters for last 24 hours:    Intake/Output from previous day: 06/11 0701 - 06/12 0700 In: 158.2 [I.V.:158.2] Out: 600 [Urine:600] Intake/Output this shift: No intake/output data recorded.  General appearance: alert, cooperative and no distress Heart: irregularly irregular rhythm and tachy Lungs: mildly dim in bases Abdomen: mild distension, non-tender Extremities: + BLE edema Wound: incis healing well without signs of infection  Lab Results: Recent Labs    10/06/19 0600 10/07/19 0409  WBC 16.3* 19.2*  HGB 10.5* 10.9*  HCT 30.7* 31.2*  PLT 168 193   BMET:  Recent Labs    10/06/19 0600 10/07/19 0410  NA 132* 129*  K 3.6 3.7  CL 102 94*  CO2 22 25  GLUCOSE 132* 143*  BUN 13 14  CREATININE 0.92 0.86  CALCIUM 8.1* 8.3*    PT/INR: No results for input(s): LABPROT, INR in the last 72 hours. ABG    Component Value Date/Time   PHART 7.386 10/05/2019 0554   HCO3 21.6 10/05/2019 0554   TCO2 23 10/05/2019 0554   ACIDBASEDEF 3.0 (H) 10/05/2019 0554   O2SAT 94.0 10/05/2019 0554   CBG (last 3)  Recent Labs    10/07/19 1625 10/07/19 2203  10/08/19 0612  GLUCAP 132* 143* 124*    Meds Scheduled Meds: . acetaminophen  1,000 mg Oral Q6H   Or  . acetaminophen (TYLENOL) oral liquid 160 mg/5 mL  1,000 mg Per Tube Q6H  . amiodarone  400 mg Oral BID  . aspirin EC  325 mg Oral Daily  . bisacodyl  10 mg Oral Daily   Or  . bisacodyl  10 mg Rectal Daily  . chlorhexidine gluconate (MEDLINE KIT)  15 mL Mouth Rinse BID  . Chlorhexidine Gluconate Cloth  6 each Topical Q0600  . docusate sodium  200 mg Oral Daily  . furosemide  40 mg Intravenous Daily  . insulin aspart  0-24 Units Subcutaneous TID AC & HS  . mouth rinse  15 mL Mouth Rinse BID  . metoCLOPramide (REGLAN) injection  10 mg Intravenous Q6H  . metolazone  5 mg Oral Daily  . metoprolol tartrate  25 mg Oral BID  . pantoprazole  40 mg Oral Daily  . potassium chloride  20 mEq Oral BID  . simvastatin  40 mg Oral Daily  . sodium chloride flush  3 mL Intravenous Q12H  . sodium chloride flush  3 mL Intravenous Q12H   Continuous Infusions: . sodium chloride    . sodium chloride    . sodium chloride 20 mL/hr at 10/05/19 0400  . sodium chloride    . lactated  ringers    . lactated ringers Stopped (10/05/19 1621)   PRN Meds:.sodium chloride, sodium chloride, metoprolol tartrate, midazolam, ondansetron (ZOFRAN) IV, oxyCODONE, sodium chloride flush, sodium chloride flush, traMADol  Xrays DG Chest 2 View  Result Date: 10/07/2019 CLINICAL DATA:  Status post CABG on 10/04/2019. EXAM: CHEST - 2 VIEW COMPARISON:  10/06/2019 FINDINGS: Stable enlarged cardiac silhouette and post CABG changes. Interval removal of left chest tube with no pneumothorax seen. Mild linear atelectasis in both mid and lower lung zones. Small bilateral pleural effusions. Unremarkable bones. IMPRESSION: 1. Small bilateral pleural effusions. 2. Mild bilateral atelectasis. 3. Stable cardiomegaly. Electronically Signed   By: Claudie Revering M.D.   On: 10/07/2019 09:48    Assessment/Plan: S/P Procedure(s)  (LRB): CORONARY ARTERY BYPASS GRAFTING (CABG) times three using right greater saphenous vein and left internal mammary  (N/A) TRANSESOPHAGEAL ECHOCARDIOGRAM (TEE) (N/A) Excision Of Sternal Keloid (N/A) Endovein Harvest Of Greater Saphenous Vein (Right)  1 afib with RVR will give amio bolus and uptitrate the beta blocker- will need to decide on ACRX prior to d/c, replace K+/Mg++ 2 BP control is reasonable- monitor with increase in lopressor 3 sats good on RA 4 cont to diurese for volume overload- current lasix/metolazone 5 BS controlled, not on DM meds, A1C 5.8 - d/c SSI, CBG 6 hold on d/c wires one more day with afib 7 repeat CBC in am, no fevers, WBC trend increasing 8 cont rehab and pulm toilet 9 poss home 1-2 days  LOS: 4 days    John Giovanni PA-C Pager [phone] 10/08/2019 Patient seen and examined, agree with above Back into rapid A fib this AM- converted back to SR with amiodarone bolus  Did well with ambulation this morning  Remo Lipps C. Roxan Hockey, MD [facility] [phone]

## 2019-10-08 NOTE — Progress Notes (Signed)
   10/08/19 1637  Assess: MEWS Score  Temp 98.2 F (36.8 C)  BP (!) 115/100  Pulse Rate (!) 122  ECG Heart Rate (!) 120  Resp 19  SpO2 97 %  O2 Device Room Air  Assess: MEWS Score  MEWS Temp 0  MEWS Systolic 0  MEWS Pulse 2  MEWS RR 0  MEWS LOC 0  MEWS Score 2  MEWS Score Color Yellow  Assess: if the MEWS score is Yellow or Red  Were vital signs taken at a resting state? Yes  Focused Assessment Documented focused assessment  Early Detection of Sepsis Score *See Row Information* Low  MEWS guidelines implemented *See Row Information* Yes  Treat  MEWS Interventions Administered prn meds/treatments  Take Vital Signs  Increase Vital Sign Frequency  Yellow: Q 2hr X 2 then Q 4hr X 2, if remains yellow, continue Q 4hrs  Escalate  MEWS: Escalate Yellow: discuss with charge nurse/RN and consider discussing with provider and RRT  Notify: Charge Nurse/RN  Name of Charge Nurse/RN Notified Ronalee Belts RN  Date Charge Nurse/RN Notified 10/08/19  Time Charge Nurse/RN Notified [id number]  Document  Progress note created (see row info) Yes

## 2019-10-08 NOTE — Progress Notes (Signed)
   10/08/19 0826  Assess: MEWS Score  Temp 97.6 F (36.4 C)  BP 124/70  Pulse Rate (!) 141  ECG Heart Rate (!) 135  Resp 20  SpO2 96 %  O2 Device Room Air  Assess: MEWS Score  MEWS Temp 0  MEWS Systolic 0  MEWS Pulse 3  MEWS RR 0  MEWS LOC 0  MEWS Score 3  MEWS Score Color Yellow  Assess: if the MEWS score is Yellow or Red  Were vital signs taken at a resting state? Yes  Focused Assessment Documented focused assessment  Early Detection of Sepsis Score *See Row Information* Low  MEWS guidelines implemented *See Row Information* Yes  Treat  MEWS Interventions Administered scheduled meds/treatments;Administered prn meds/treatments;Other (Comment) (MD aware)  Take Vital Signs  Increase Vital Sign Frequency  Yellow: Q 2hr X 2 then Q 4hr X 2, if remains yellow, continue Q 4hrs  Escalate  MEWS: Escalate Yellow: discuss with charge nurse/RN and consider discussing with provider and RRT  Notify: Charge Nurse/RN  Name of Charge Nurse/RN Notified Ronalee Belts RN  Date Charge Nurse/RN Notified 10/08/19  Time Charge Nurse/RN Notified [phone]  Document  Progress note created (see row info) Yes

## 2019-10-08 NOTE — Progress Notes (Signed)
CARDIAC REHAB PHASE I   PRE:  Rate/Rhythm:   BP:  Supine:   Sitting:   Standing:    SaO2:   MODE:  Ambulation:  ft   POST:  Rate/Rhythm:   BP:  Supine:   Sitting:   Standing:    SaO2:  Patient just completed walking with a mobility specialist and tolerated well.  Presently in [place] [facility].  Wife and son present for cardiac rehab discharge instructions, for possible d/c in 1-2 days.  Dr. Roxan Hockey in to see patient.  Education completed re: sternal precautions, keeping your arms in the tube, showering and sternal incision care, daily weights, when to call the surgeon, IS use, home exercise progression, referred to phase II cardiac rehab in [place].   [phone] Liliane Channel RN, BSN 10/08/2019 12:10 PM

## 2019-10-09 LAB — BASIC METABOLIC PANEL
Anion gap: 12 (ref 5–15)
BUN: 25 mg/dL — ABNORMAL HIGH (ref 8–23)
CO2: 26 mmol/L (ref 22–32)
Calcium: 8.4 mg/dL — ABNORMAL LOW (ref 8.9–10.3)
Chloride: 88 mmol/L — ABNORMAL LOW (ref 98–111)
Creatinine, Ser: 0.98 mg/dL (ref 0.61–1.24)
GFR calc Af Amer: >60 mL/min (ref >60)
GFR calc non Af Amer: >60 mL/min (ref >60)
Glucose, Bld: 126 mg/dL — ABNORMAL HIGH (ref 70–99)
Potassium: 3.4 mmol/L — ABNORMAL LOW (ref 3.5–5.1)
Sodium: 126 mmol/L — ABNORMAL LOW (ref 135–145)

## 2019-10-09 LAB — CBC
HCT: 31.8 % — ABNORMAL LOW (ref 39.0–52.0)
Hemoglobin: 11.2 g/dL — ABNORMAL LOW (ref 13.0–17.0)
MCH: 30.8 pg (ref 26.0–34.0)
MCHC: 35.2 g/dL (ref 30.0–36.0)
MCV: 87.4 fL (ref 80.0–100.0)
Platelets: 289 10*3/uL (ref 150–400)
RBC: 3.64 MIL/uL — ABNORMAL LOW (ref 4.22–5.81)
RDW: 12.1 % (ref 11.5–15.5)
WBC: 15.9 10*3/uL — ABNORMAL HIGH (ref 4.0–10.5)
nRBC: 0 % (ref 0.0–0.2)

## 2019-10-09 MED ORDER — FUROSEMIDE 10 MG/ML IJ SOLN
40.0000 mg | Freq: Two times a day (BID) | INTRAMUSCULAR | Status: DC
  Administered 2019-10-09: 40 mg via INTRAVENOUS
  Filled 2019-10-09: qty 4

## 2019-10-09 MED ORDER — FUROSEMIDE 40 MG PO TABS
40.0000 mg | ORAL_TABLET | Freq: Two times a day (BID) | ORAL | Status: DC
  Administered 2019-10-09: 40 mg via ORAL
  Filled 2019-10-09: qty 1

## 2019-10-09 MED ORDER — FUROSEMIDE 10 MG/ML IJ SOLN: 40.0000 mg | Freq: Two times a day (BID) | INTRAMUSCULAR | Status: DC

## 2019-10-09 NOTE — Progress Notes (Signed)
EPW removed per protocol. Pt tolerated well. VSS. Pt informed of bedrest for 1 hour. All questions answered. Will continue to monitor.  Arletta Bale, RN

## 2019-10-09 NOTE — Progress Notes (Signed)
1124: Nurse tech called this RN to assess pt. Pt was on his knees on the floor. Family at bedside and stated pt was standing up to use urinal and felt dizzy. Family members assisted pt to the ground. Pt did not sustain any injuries from this event. RN, nurse tech, and mobility specialist assisted pt into the bed. VSS. Temperature 97.9, BP 101/65 (78), HR 76, RR 16, O2 97% room air. PA notified. Bed alarm on. Post fall huddle completed. Will continue to monitor.  Arletta Bale, RN

## 2019-10-09 NOTE — Progress Notes (Signed)
Mobility Specialist - Progress Note   10/09/19 1113  Mobility  Activity Ambulated in room  Level of Assistance Modified independent, requires aide device or extra time  Assistive Device None  Distance Ambulated (ft) 10 ft  Mobility Response Tolerated poorly;RN notified  Mobility performed by Mobility specialist  $Mobility charge 1 Mobility    Pre-mobility: 123 HR, 131/77 BP, 97% SpO2 Post-mobility: 130 HR, 133/99 BP, 96% SPO2  After standing pt stated that he felt dizzy, he then leaned forward and began stumbling forward. I used the gait belt to hold him and then safely transferred him to the bed.   [place] Specialist

## 2019-10-09 NOTE — Progress Notes (Addendum)
   10/09/19 1124  What Happened  Was fall witnessed? No  Was patient injured? No  Patient found on floor  Found by Staff-comment  Stated prior activity bathroom-unassisted (using the urinal at the bedside)  Follow Up  MD notified Jadene Pierini PA  Time MD notified 1136  Family notified Yes - comment  Time family notified 1124 (family in room during event)  Additional tests No  Simple treatment Other (comment) (bed alarm on)  Progress note created (see row info) Yes  Adult Fall Risk Assessment  Risk Factor Category (scoring not indicated) Fall has occurred during this admission (document High fall risk)  Age 74  Fall History: Fall within 6 months prior to admission 5  Elimination; Bowel and/or Urine Incontinence 0  Elimination; Bowel and/or Urine Urgency/Frequency 2  Medications: includes PCA/Opiates, Anti-convulsants, Anti-hypertensives, Diuretics, Hypnotics, Laxatives, Sedatives, and Psychotropics 5  Patient Care Equipment 1  Mobility-Assistance 2  Mobility-Gait 0  Mobility-Sensory Deficit 0  Altered awareness of immediate physical environment 0  Impulsiveness 0  Lack of understanding of one's physical/cognitive limitations 0  Total Score 17  Patient Fall Risk Level High fall risk  Adult Fall Risk Interventions  Required Bundle Interventions *See Row Information* High fall risk - low, moderate, and high requirements implemented  Additional Interventions Use of appropriate toileting equipment (bedpan, BSC, etc.)  Screening for Fall Injury Risk (To be completed on HIGH fall risk patients) - Assessing Need for Low Bed  Risk For Fall Injury- Low Bed Criteria Previous fall this admission  Will Implement Low Bed and Floor Mats No - Criteria no longer met for low bed  Pain Assessment  Pain Scale 0-10  Pain Score 0

## 2019-10-09 NOTE — Progress Notes (Addendum)
[address]       [place] [address]             [phone]      5 Days Post-Op Procedure(s) (LRB): CORONARY ARTERY BYPASS GRAFTING (CABG) times three using right greater saphenous vein and left internal mammary  (N/A) TRANSESOPHAGEAL ECHOCARDIOGRAM (TEE) (N/A) Excision Of Sternal Keloid (N/A) Endovein Harvest Of Greater Saphenous Vein (Right) Subjective: Has had some intermit afib. Looks like last time around 7 pm yesterday evening  Objective: Vital signs in last 24 hours: Temp:  [97.5 F (36.4 C)-100.4 F (38 C)] 98.4 F (36.9 C) (06/13 0734) Pulse Rate:  [71-141] 79 (06/13 0734) Cardiac Rhythm: Normal sinus rhythm (06/13 0739) Resp:  [16-21] 20 (06/13 0734) BP: (90-129)/(54-100) 128/83 (06/13 0734) SpO2:  [96 %-98 %] 98 % (06/13 0734) Weight:  [99.4 kg] 99.4 kg (06/13 0453)  Hemodynamic parameters for last 24 hours:    Intake/Output from previous day: 06/12 0701 - 06/13 0700 In: 200 [P.O.:200] Out: 975 [Urine:975] Intake/Output this shift: Total I/O In: -  Out: 400 [Urine:400]  General appearance: alert, cooperative and no distress Heart: regular rate and rhythm Lungs: min dim in bases Abdomen: benign Extremities: + edema right>left LE Wound: incis healing well  Lab Results: Recent Labs    10/07/19 0409 10/09/19 0402  WBC 19.2* 15.9*  HGB 10.9* 11.2*  HCT 31.2* 31.8*  PLT 193 289   BMET:  Recent Labs    10/07/19 0410 10/09/19 0402  NA 129* 126*  K 3.7 3.4*  CL 94* 88*  CO2 25 26  GLUCOSE 143* 126*  BUN 14 25*  CREATININE 0.86 0.98  CALCIUM 8.3* 8.4*    PT/INR: No results for input(s): LABPROT, INR in the last 72 hours. ABG    Component Value Date/Time   PHART 7.386 10/05/2019 0554   HCO3 21.6 10/05/2019 0554   TCO2 23 10/05/2019 0554   ACIDBASEDEF 3.0 (H) 10/05/2019 0554   O2SAT 94.0 10/05/2019 0554   CBG (last 3)  Recent Labs    10/07/19 2203 10/08/19 0612 10/08/19 1118  GLUCAP 143* 124* 148*     Meds Scheduled Meds: . acetaminophen  1,000 mg Oral Q6H   Or  . acetaminophen (TYLENOL) oral liquid 160 mg/5 mL  1,000 mg Per Tube Q6H  . amiodarone  400 mg Oral BID  . aspirin EC  325 mg Oral Daily  . bisacodyl  10 mg Oral Daily   Or  . bisacodyl  10 mg Rectal Daily  . chlorhexidine gluconate (MEDLINE KIT)  15 mL Mouth Rinse BID  . Chlorhexidine Gluconate Cloth  6 each Topical Q0600  . docusate sodium  200 mg Oral Daily  . furosemide  40 mg Intravenous Daily  . magnesium oxide  400 mg Oral BID  . mouth rinse  15 mL Mouth Rinse BID  . metoCLOPramide (REGLAN) injection  10 mg Intravenous Q6H  . metoprolol tartrate  37.5 mg Oral BID  . pantoprazole  40 mg Oral Daily  . potassium chloride  40 mEq Oral BID  . simvastatin  40 mg Oral Daily  . sodium chloride flush  3 mL Intravenous Q12H  . sodium chloride flush  3 mL Intravenous Q12H   Continuous Infusions: . sodium chloride    . sodium chloride    . sodium chloride 20 mL/hr at 10/05/19 0400  . sodium chloride    . lactated ringers    . lactated ringers Stopped (10/05/19 1621)  PRN Meds:.sodium chloride, sodium chloride, metoprolol tartrate, midazolam, ondansetron (ZOFRAN) IV, oxyCODONE, sodium chloride flush, sodium chloride flush, traMADol  Xrays No results found.  Assessment/Plan: S/P Procedure(s) (LRB): CORONARY ARTERY BYPASS GRAFTING (CABG) times three using right greater saphenous vein and left internal mammary  (N/A) TRANSESOPHAGEAL ECHOCARDIOGRAM (TEE) (N/A) Excision Of Sternal Keloid (N/A) Endovein Harvest Of Greater Saphenous Vein (Right)  1 conts with steady overall progress 2 conts current management for afib, CHADS2VASC is 2 so could consider AC RX 3 leukocytosis trend improving, Tmax 100.4 4 H/H stable 5 renal fxn normal, he still has a fair amount of periph edema , will increase lasix to 40 BID 6 replace K+ 7 routine rehab and pulm toilet, d/c pacer wires  LOS: 5 days    John Giovanni PA-C Pager  [phone] 10/09/2019 Patient seen and examined, agree with above Back into atrial fib after pacing wires removed IV lopressor now Supplement K  Zerline Melchior C. Roxan Hockey, MD [facility] [phone]

## 2019-10-10 ENCOUNTER — Other Ambulatory Visit: Payer: Self-pay | Admitting: Cardiothoracic Surgery

## 2019-10-10 LAB — BASIC METABOLIC PANEL
Anion gap: 11 (ref 5–15)
BUN: 22 mg/dL (ref 8–23)
CO2: 30 mmol/L (ref 22–32)
Calcium: 8.3 mg/dL — ABNORMAL LOW (ref 8.9–10.3)
Chloride: 86 mmol/L — ABNORMAL LOW (ref 98–111)
Creatinine, Ser: 1.12 mg/dL (ref 0.61–1.24)
GFR calc Af Amer: >60 mL/min (ref >60)
GFR calc non Af Amer: >60 mL/min (ref >60)
Glucose, Bld: 134 mg/dL — ABNORMAL HIGH (ref 70–99)
Potassium: 2.9 mmol/L — ABNORMAL LOW (ref 3.5–5.1)
Sodium: 127 mmol/L — ABNORMAL LOW (ref 135–145)

## 2019-10-10 MED ORDER — POTASSIUM CHLORIDE 20 MEQ/15ML (10%) PO SOLN
40.0000 meq | Freq: Once | ORAL | Status: AC
  Administered 2019-10-10: 40 meq via ORAL

## 2019-10-10 MED ORDER — POTASSIUM CHLORIDE CRYS ER 20 MEQ PO TBCR
40.0000 meq | EXTENDED_RELEASE_TABLET | Freq: Once | ORAL | Status: DC
  Filled 2019-10-10: qty 2

## 2019-10-10 MED ORDER — AMIODARONE LOAD VIA INFUSION
150.0000 mg | Freq: Once | INTRAVENOUS | Status: DC
  Filled 2019-10-10: qty 83.34

## 2019-10-10 MED ORDER — FUROSEMIDE 40 MG PO TABS
40.0000 mg | ORAL_TABLET | Freq: Every day | ORAL | Status: DC
  Administered 2019-10-11 – 2019-10-12 (×2): 40 mg via ORAL
  Filled 2019-10-10 (×2): qty 1

## 2019-10-10 MED ORDER — APIXABAN 5 MG PO TABS: 5.0000 mg | ORAL_TABLET | Freq: Two times a day (BID) | ORAL | Status: DC

## 2019-10-10 MED ORDER — POTASSIUM CHLORIDE 20 MEQ/15ML (10%) PO SOLN
40.0000 meq | Freq: Two times a day (BID) | ORAL | Status: DC
  Filled 2019-10-10: qty 30

## 2019-10-10 MED ORDER — AMIODARONE IV BOLUS ONLY 150 MG/100ML
150.0000 mg | INTRAVENOUS | Status: AC
  Administered 2019-10-10: 150 mg via INTRAVENOUS
  Filled 2019-10-10 (×2): qty 100

## 2019-10-10 MED ORDER — APIXABAN 5 MG PO TABS
5.0000 mg | ORAL_TABLET | Freq: Two times a day (BID) | ORAL | Status: DC
  Administered 2019-10-10 – 2019-10-12 (×4): 5 mg via ORAL
  Filled 2019-10-10 (×4): qty 1

## 2019-10-10 MED ORDER — POTASSIUM CHLORIDE 20 MEQ/15ML (10%) PO SOLN
40.0000 meq | Freq: Two times a day (BID) | ORAL | Status: DC
  Administered 2019-10-10: 40 meq via ORAL
  Filled 2019-10-10 (×2): qty 30

## 2019-10-10 MED ORDER — METOPROLOL TARTRATE 25 MG PO TABS
25.0000 mg | ORAL_TABLET | Freq: Two times a day (BID) | ORAL | Status: DC
  Administered 2019-10-10 – 2019-10-12 (×5): 25 mg via ORAL
  Filled 2019-10-10 (×5): qty 1

## 2019-10-10 NOTE — Progress Notes (Signed)
CARDIAC REHAB PHASE I   Offered to walk with pt. Pt currently getting amio bolus and ordering lunch. Requesting to walk later. Will return.  Rufina Falco, RN BSN 10/10/2019 1:24 PM

## 2019-10-10 NOTE — Progress Notes (Signed)
Pt is back in bed HR is in the 80s and NSR, we'll continue to monitor.

## 2019-10-10 NOTE — Discharge Instructions (Signed)
Discharge Instructions:  1. You may shower, please wash incisions daily with soap and water and keep dry.  If you wish to cover wounds with dressing you may do so but please keep clean and change daily.  No tub baths or swimming until incisions have completely healed.  If your incisions become red or develop any drainage please call our office at [phone]  2. No Driving until cleared by our office and you are no longer using narcotic pain medications  3. Monitor your weight daily.. Please use the same scale and weigh at same time... If you gain 3-5 lbs in 48 hours with associated lower extremity swelling, please contact our office at [phone]  4. Fever of 101.5 for at least 24 hours, please contact our office at [phone]  5. Activity- up as tolerated, please walk at least 3 times per day.  Avoid strenuous activity, no lifting, pushing, or pulling with your arms over 8-10 lbs for a minimum of 6 weeks  6. If any questions or concerns arise, please do not hesitate to contact our office at [phone]   Information on my medicine - ELIQUIS (apixaban)  This medication education was reviewed with me or my healthcare representative as part of my discharge preparation.  The pharmacist that spoke with me during my hospital stay was:  Einar Grad, [facility]  Why was Eliquis prescribed for you? Eliquis was prescribed for you to reduce the risk of a blood clot forming that can cause a stroke if you have a medical condition called atrial fibrillation (a type of irregular heartbeat).  What do You need to know about Eliquis ? Take your Eliquis TWICE DAILY - one tablet in the morning and one tablet in the evening with or without food. If you have difficulty swallowing the tablet whole please discuss with your pharmacist how to take the medication safely.  Take Eliquis exactly as prescribed by your doctor and DO NOT stop taking Eliquis without talking to the doctor who prescribed the  medication.  Stopping may increase your risk of developing a stroke.  Refill your prescription before you run out.  After discharge, you should have regular check-up appointments with your healthcare provider that is prescribing your Eliquis.  In the future your dose may need to be changed if your kidney function or weight changes by a significant amount or as you get older.  What do you do if you miss a dose? If you miss a dose, take it as soon as you remember on the same day and resume taking twice daily.  Do not take more than one dose of ELIQUIS at the same time to make up a missed dose.  Important Safety Information A possible side effect of Eliquis is bleeding. You should call your healthcare provider right away if you experience any of the following: ? Bleeding from an injury or your nose that does not stop. ? Unusual colored urine (red or dark brown) or unusual colored stools (red or black). ? Unusual bruising for unknown reasons. ? A serious fall or if you hit your head (even if there is no bleeding).  Some medicines may interact with Eliquis and might increase your risk of bleeding or clotting while on Eliquis. To help avoid this, consult your healthcare provider or pharmacist prior to using any new prescription or non-prescription medications, including herbals, vitamins, non-steroidal anti-inflammatory drugs (NSAIDs) and supplements.  This website has more information on Eliquis (apixaban): http://www.eliquis.com/eliquis/home

## 2019-10-10 NOTE — TOC Benefit Eligibility Note (Signed)
Transition of Care [facility]) Benefit Eligibility Note    Patient Details  Name: Steve Frye MRN: [id number] Date of Birth: 11-07-1945   Medication/Dose: Eliquis 55m. bid.  Covered?: Yes  Tier: 3 Drug  Prescription [facility]age [facility]  Spoke with Person/Company/Phone Number:: SRolley Sims W/[employer] PH# [phone] Co-Pay: $485.00 for 30 day supply bid,before deductible met,after the deductible has been met Eliquis 5 mg. bid will be $45.00 for 30 day supply.  Prior Approval: No  Deductible: Unmet       HShelda AltesPhone Number: 10/10/2019, 3:16 PM

## 2019-10-10 NOTE — Progress Notes (Signed)
CARDIAC REHAB PHASE I   PRE:  Rate/Rhythm: 83 SR  BP:  Sitting: 126/59  Standing: 128/116  SaO2: 97 RA  MODE:  Ambulation: 110 ft   POST:  Rate/Rhythm: 93 SR  BP:  Sitting: 136/69    SaO2: 98 RA  Pt ambulated 113ft in hallway assist of one with gait belt and front wheel walker. Pt denies dizziness, CP, or SOB. Pt returned to recliner, family at bedside. Encouraged continued ambulation as able with an emphasis on paying attention to symptoms. Pt currently demonstrating 1125 on IS. Will continue to follow.  [phone] Rufina Falco, RN BSN 10/10/2019 3:00 PM

## 2019-10-10 NOTE — Progress Notes (Signed)
Patient sitting on the side of the bed taking his medication HR went up to 171 and did not sustain it. We'll continue to monitor.

## 2019-10-10 NOTE — Progress Notes (Signed)
Mobility Specialist - Progress Note   10/10/19 1652  Mobility  Activity Ambulated in hall  Level of Assistance Modified independent, requires aide device or extra time  Assistive Device Front wheel walker  Distance Ambulated (ft) 140 ft  Mobility Response Tolerated well  Mobility performed by Mobility specialist  $Mobility charge 1 Mobility    Pre-mobility: 79 HR, 117/59 BP, 96% SpO2 During mobility: 92 HR, 100% SpO2 Post-mobility: 88 HR, 136/66 BP, 98% SpO2  Pt denied SOB, CP, dizziness, or lightheadedness.   [place] Specialist

## 2019-10-10 NOTE — Progress Notes (Addendum)
[address]       [place] [address]             [phone]      6 Days Post-Op Procedure(s) (LRB): CORONARY ARTERY BYPASS GRAFTING (CABG) times three using right greater saphenous vein and left internal mammary  (N/A) TRANSESOPHAGEAL ECHOCARDIOGRAM (TEE) (N/A) Excision Of Sternal Keloid (N/A) Endovein Harvest Of Greater Saphenous Vein (Right) Subjective: At 11:30am yesterday the patient experienced some dizziness and was on his knees on the floor. BP was low 101/65 and patient was documented to be in NSR. Last night around 11:00pm he had a few beats of non-sustained SVT rate in the 170s. Overall, the patient this morning is doing well. He is having trouble swallowing pills especially large ones. He does okay with applesauce.   Objective: Vital signs in last 24 hours: Temp:  [97.9 F (36.6 C)-99.2 F (37.3 C)] 98.1 F (36.7 C) (06/14 0505) Pulse Rate:  [73-131] 80 (06/14 0505) Cardiac Rhythm: Other (Comment) (06/13 2346) Resp:  [16-20] 17 (06/14 0505) BP: (101-135)/(61-100) 126/61 (06/14 0505) SpO2:  [94 %-100 %] 94 % (06/14 0505) Weight:  [96.3 kg] 96.3 kg (06/14 0505)     Intake/Output from previous day: 06/13 0701 - 06/14 0700 In: 720 [P.O.:720] Out: 1000 [Urine:1000] Intake/Output this shift: No intake/output data recorded.  General appearance: alert, cooperative and no distress Heart: regular rate and rhythm, S1, S2 normal, no murmur, click, rub or gallop Lungs: clear to auscultation bilaterally Abdomen: soft, non-tender; bowel sounds normal; no masses,  no organomegaly Extremities: extremities normal, atraumatic, no cyanosis or edema Wound: clean and dry  Lab Results: Recent Labs    10/09/19 0402  WBC 15.9*  HGB 11.2*  HCT 31.8*  PLT 289   BMET:  Recent Labs    10/09/19 0402 10/10/19 0514  NA 126* 127*  K 3.4* 2.9*  CL 88* 86*  CO2 26 30  GLUCOSE 126* 134*  BUN 25* 22  CREATININE 0.98 1.12  CALCIUM 8.4* 8.3*    PT/INR:  No results for input(s): LABPROT, INR in the last 72 hours. ABG    Component Value Date/Time   PHART 7.386 10/05/2019 0554   HCO3 21.6 10/05/2019 0554   TCO2 23 10/05/2019 0554   ACIDBASEDEF 3.0 (H) 10/05/2019 0554   O2SAT 94.0 10/05/2019 0554   CBG (last 3)  Recent Labs    10/07/19 2203 10/08/19 0612 10/08/19 1118  GLUCAP 143* 124* 148*    Assessment/Plan: S/P Procedure(s) (LRB): CORONARY ARTERY BYPASS GRAFTING (CABG) times three using right greater saphenous vein and left internal mammary  (N/A) TRANSESOPHAGEAL ECHOCARDIOGRAM (TEE) (N/A) Excision Of Sternal Keloid (N/A) Endovein Harvest Of Greater Saphenous Vein (Right)  1. CV- non-sustained SVT in the 170s. Continue Amio, full dose ASA, and metoprolol. He is NSR in the 80s this morning.  2. Pulm- tolerating room air with excellent saturation.   3. Renal-creatinine is 1.12, hypokalemia-2.9 this morning. Will order an extra dose of potassium and keep him on 40MEQ BID. Lasix cut back to 40mg  daily.  4. H and H 11.2/31.8, expected acute blood loss anemia 5. Endo-blood glucose well controlled.   Plan: Went into Afib while I was rounding. Rate in the 130s. Will plan to give oral Amio and Metoprolol. Replace potassium since he was 2.9 this morning. Getting magnesium replacement already. May need a bolus of Amio if rate continues to climb.    LOS: 6 days    Elgie Collard 10/10/2019  Patient examined and patient discussed with Ms.Harriet Pho PA-C for coordination of care.  We will give extra IV dose of amiodarone, increase potassium supplement and start Eliquis for persistent recurrent atrial fibrillation.  Patient understands discharge will be delayed for probably 48 hours.  His complaints of difficulty swallowing will be evaluated by speech pathology.  patient examined and medical record reviewed,agree with above note. Tharon Aquas Trigt III 10/10/2019

## 2019-10-11 ENCOUNTER — Ambulatory Visit: Payer: Medicare Other | Admitting: Cardiology

## 2019-10-11 LAB — BASIC METABOLIC PANEL
Anion gap: 11 (ref 5–15)
BUN: 17 mg/dL (ref 8–23)
CO2: 28 mmol/L (ref 22–32)
Calcium: 8.5 mg/dL — ABNORMAL LOW (ref 8.9–10.3)
Chloride: 88 mmol/L — ABNORMAL LOW (ref 98–111)
Creatinine, Ser: 1.03 mg/dL (ref 0.61–1.24)
GFR calc Af Amer: >60 mL/min (ref >60)
GFR calc non Af Amer: >60 mL/min (ref >60)
Glucose, Bld: 116 mg/dL — ABNORMAL HIGH (ref 70–99)
Potassium: 3.1 mmol/L — ABNORMAL LOW (ref 3.5–5.1)
Sodium: 127 mmol/L — ABNORMAL LOW (ref 135–145)

## 2019-10-11 MED ORDER — POTASSIUM CHLORIDE CRYS ER 20 MEQ PO TBCR
40.0000 meq | EXTENDED_RELEASE_TABLET | Freq: Once | ORAL | Status: AC
  Administered 2019-10-11: 40 meq via ORAL
  Filled 2019-10-11: qty 2

## 2019-10-11 MED ORDER — POTASSIUM CHLORIDE ER 10 MEQ PO TBCR
40.0000 meq | EXTENDED_RELEASE_TABLET | Freq: Once | ORAL | Status: AC
  Administered 2019-10-11: 40 meq via ORAL
  Filled 2019-10-11 (×2): qty 4

## 2019-10-11 MED ORDER — ASPIRIN EC 81 MG PO TBEC
81.0000 mg | DELAYED_RELEASE_TABLET | Freq: Every day | ORAL | Status: DC
  Administered 2019-10-11 – 2019-10-12 (×2): 81 mg via ORAL
  Filled 2019-10-11 (×2): qty 1

## 2019-10-11 MED ORDER — POTASSIUM CHLORIDE CRYS ER 20 MEQ PO TBCR
40.0000 meq | EXTENDED_RELEASE_TABLET | Freq: Two times a day (BID) | ORAL | Status: DC
  Administered 2019-10-11 – 2019-10-12 (×2): 40 meq via ORAL
  Filled 2019-10-11 (×2): qty 2

## 2019-10-11 MED ORDER — POTASSIUM CHLORIDE 20 MEQ/15ML (10%) PO SOLN: 40.0000 meq | Freq: Once | ORAL | Status: DC

## 2019-10-11 NOTE — Progress Notes (Signed)
Mobility Specialist - Progress Note   10/11/19 1613  Mobility  Activity Ambulated in hall  Level of Assistance Modified independent, requires aide device or extra time  Assistive Device Front wheel walker  Distance Ambulated (ft) 700 ft  Mobility Response Tolerated well  Mobility performed by Mobility specialist  $Mobility charge 1 Mobility    Pre-mobility: 75 HR, 114/51 BP, 95%SpO2 During mobility: 90 HR, 96% SpO2 Post-mobility: 80 HR, 128/65 BP, 96% SpO2   [employer] Mobility Specialist [phone]

## 2019-10-11 NOTE — Evaluation (Signed)
Clinical/Bedside Swallow Evaluation Patient Details  Name: Steve Frye MRN: [id number] Date of Birth: [birth year]  Today's Date: 10/11/2019 Time: SLP Start Time (ACUTE ONLY): 1042 SLP Stop Time (ACUTE ONLY): 1053 SLP Time Calculation (min) (ACUTE ONLY): 11 min  Past Medical History:  Past Medical History:  Diagnosis Date  . Arthritis 01/15/2017  . Atrial fibrillation ([place])   . Cancer ([place])    skin cancer  . Chest mass 12/04/2015  . Coronary artery disease   . Cutaneous abscess of chest wall 12/04/2015  . Dyspnea   . Dysrhythmia    afib  . Epidermoid cyst of skin 12/04/2015  . GERD (gastroesophageal reflux disease) 01/15/2017  . High risk medication use 01/10/2015   Overview:  Overview:  flecanide  . Hyperlipidemia 01/15/2017  . LAE (left atrial enlargement) 01/15/2017  . Open wound of chest wall 04/01/2016  . PAF (paroxysmal atrial fibrillation) ([place]) 01/10/2015   Overview:  CHADS2 vasc score=1   Past Surgical History:  Past Surgical History:  Procedure Laterality Date  . APPENDECTOMY    . CARDIAC CATHETERIZATION     09/23/19  . CHOLECYSTECTOMY    . CORONARY ARTERY BYPASS GRAFT N/A 10/04/2019   Procedure: CORONARY ARTERY BYPASS GRAFTING (CABG) times three using right greater saphenous vein and left internal mammary ;  Surgeon: Ivin Poot, MD;  Location: [facility];  Service: Open Heart Surgery;  Laterality: N/A;  . CYST EXCISION    . ENDOVEIN HARVEST OF GREATER SAPHENOUS VEIN Right 10/04/2019   Procedure: [name] Of Greater Saphenous Vein;  Surgeon: Ivin Poot, MD;  Location: [facility];  Service: Open Heart Surgery;  Laterality: Right;  . EXCISION OF KELOID N/A 10/04/2019   Procedure: Excision Of Sternal Keloid;  Surgeon: Ivin Poot, MD;  Location: [facility];  Service: Open Heart Surgery;  Laterality: N/A;  . LAPAROSCOPIC LYSIS INTESTINAL ADHESIONS  2012  . LEFT HEART CATH AND CORONARY ANGIOGRAPHY N/A 09/23/2019   Procedure: LEFT HEART CATH AND CORONARY ANGIOGRAPHY;  Surgeon:  Belva Crome, MD;  Location: [facility];  Service: Cardiovascular;  Laterality: N/A;  . TEE WITHOUT CARDIOVERSION N/A 10/04/2019   Procedure: TRANSESOPHAGEAL ECHOCARDIOGRAM (TEE);  Surgeon: Prescott Gum, Collier Salina, MD;  Location: [facility];  Service: Open Heart Surgery;  Laterality: N/A;   HPI:  74 y.o. male admitted for CABGx3 10/04/19. Has reported difficulty swallowing pills, hence SLP swallow eval ordered.    Assessment / Plan / Recommendation Clinical Impression  Pt reports difficulty swallowing pills with occasional lodging in throat.  RN present and pt took meds in applesauce with no obvious deficits.  Presents with normal oral mechanism exam; no focal deficits.  There is adequate oral manipulation; swift swallow response; there are no overt s/s of aspiration.  We discussed c/o globus and the time it may take the esophagus to "wake up" after surgery.  Encouraged pt to continue to take meds in applesauce; break in half or crush if too large and when medically appropriate.  Pt agrees. No further recommendations or f/u is needed.  Our service will sign off.  SLP Visit Diagnosis: Dysphagia, unspecified (R13.10)    Aspiration Risk  No limitations    Diet Recommendation   regular solids, thin liquids  Medication Administration: Whole meds with puree (crush/break if large)    Other  Recommendations Oral Care Recommendations: Oral care BID   Follow up Recommendations None        Swallow Study   General HPI: 74 y.o. male admitted  for CABGx3 10/04/19. Has reported difficulty swallowing pills, hence SLP swallow eval ordered.  Type of Study: Bedside Swallow Evaluation Previous Swallow Assessment: no Diet Prior to this Study: Regular;Thin liquids Temperature Spikes Noted: No Respiratory Status: Room air History of Recent Intubation: Yes Length of Intubations (days):  (for surgery) Behavior/Cognition: Alert;Cooperative Oral Cavity Assessment: Within Functional Limits Oral Care Completed by  SLP: No Oral Cavity - Dentition: Adequate natural dentition Vision: Functional for self-feeding Self-Feeding Abilities: Able to feed self Patient Positioning: Upright in chair Baseline Vocal Quality: Normal Volitional Cough: Strong Volitional Swallow: Able to elicit    Oral/Motor/Sensory Function Overall Oral Motor/Sensory Function: Within functional limits   Ice Chips Ice chips: Within functional limits   Thin Liquid Thin Liquid: Within functional limits    Nectar Thick Nectar Thick Liquid: Not tested   Honey Thick Honey Thick Liquid: Not tested   Puree Puree: Within functional limits   Solid     Solid: Not tested      Juan Quam Laurice 10/11/2019,11:02 AM Estill Bamberg L. Tivis Ringer, [place] Office number [phone] Pager [phone]

## 2019-10-11 NOTE — Progress Notes (Addendum)
      [address]       [place] [address]             [phone]      7 Days Post-Op Procedure(s) (LRB): CORONARY ARTERY BYPASS GRAFTING (CABG) times three using right greater saphenous vein and left internal mammary  (N/A) TRANSESOPHAGEAL ECHOCARDIOGRAM (TEE) (N/A) Excision Of Sternal Keloid (N/A) Endovein Harvest Of Greater Saphenous Vein (Right) Subjective: Feels okay this morning. Wants to see how he feels today after walking in the halls.   Objective: Vital signs in last 24 hours: Temp:  [97.7 F (36.5 C)-98.3 F (36.8 C)] 97.9 F (36.6 C) (06/15 0352) Pulse Rate:  [72-131] 72 (06/14 2312) Cardiac Rhythm: Normal sinus rhythm (06/14 2000) Resp:  [17-20] 18 (06/15 0352) BP: (112-127)/(47-88) 125/47 (06/15 0352) SpO2:  [97 %-99 %] 98 % (06/15 0352) Weight:  [96.8 kg] 96.8 kg (06/15 0426)    Intake/Output from previous day: 06/14 0701 - 06/15 0700 In: 760 [P.O.:760] Out: 950 [Urine:950] Intake/Output this shift: No intake/output data recorded.  General appearance: alert, cooperative and no distress Heart: regular rate and rhythm, S1, S2 normal, no murmur, click, rub or gallop Lungs: clear to auscultation bilaterally Abdomen: soft, non-tender; bowel sounds normal; no masses,  no organomegaly Extremities: extremities normal, atraumatic, no cyanosis or edema Wound: clean and dry  Lab Results: Recent Labs    10/09/19 0402  WBC 15.9*  HGB 11.2*  HCT 31.8*  PLT 289   BMET:  Recent Labs    10/09/19 0402 10/10/19 0514  NA 126* 127*  K 3.4* 2.9*  CL 88* 86*  CO2 26 30  GLUCOSE 126* 134*  BUN 25* 22  CREATININE 0.98 1.12  CALCIUM 8.4* 8.3*    PT/INR: No results for input(s): LABPROT, INR in the last 72 hours. ABG    Component Value Date/Time   PHART 7.386 10/05/2019 0554   HCO3 21.6 10/05/2019 0554   TCO2 23 10/05/2019 0554   ACIDBASEDEF 3.0 (H) 10/05/2019 0554   O2SAT 94.0 10/05/2019 0554   CBG (last 3)  Recent Labs     10/08/19 1118  GLUCAP 148*    Assessment/Plan: S/P Procedure(s) (LRB): CORONARY ARTERY BYPASS GRAFTING (CABG) times three using right greater saphenous vein and left internal mammary  (N/A) TRANSESOPHAGEAL ECHOCARDIOGRAM (TEE) (N/A) Excision Of Sternal Keloid (N/A) Endovein Harvest Of Greater Saphenous Vein (Right)  1. CV-No more Afib overnight. Continue Amio, Eliquis/asa, and metoprolol. He is NSR in the 90s this morning.  2. Pulm- tolerating room air with excellent saturation.   3. Renal-creatinine is 1.12, hypokalemia-2.9 yesterday, replace. Continue lasix once a day 4. H and H 11.2/31.8, expected acute blood loss anemia 5. Endo-blood glucose well controlled. 6. GI- + bowel movement  Plan: BMP today. Continues to improve. Remaining in NSR. Will likely need one more day and will plan for discharge tomorrow.    LOS: 7 days    Elgie Collard 10/11/2019 Patient feels better in sinus rhythm Replete potassium and follow rhythm on current medication doses-plan discharge in a.m. if he holds sinus rhythm today. Swallow evaluation by speech pathology is pending to assess   patient's expressed difficulty swallowing. Discharge instructions reviewed with the patient patient examined and medical record reviewed,agree with above note. Tharon Aquas Trigt III 10/11/2019

## 2019-10-11 NOTE — Progress Notes (Signed)
CARDIAC REHAB PHASE I   PRE:  Rate/Rhythm: 80 SR  BP:  Sitting: 120/60      SaO2: 95 RA  MODE:  Ambulation: 420 ft   POST:  Rate/Rhythm: 90 SR  BP:  Sitting: 124/71    SaO2: 99 RA  Pt ambulated 493ft in hallway standby assist with front wheel walker. Pt denies pain, SOB, or dizziness. Pt returned to recliner, hopes of d/c tomorrow. Pt requesting front wheel walker and BSC, CM made aware. Encouraged continued IS use and ambulation. Will continue to follow.  [id number] Rufina Falco, RN BSN 10/11/2019 2:21 PM

## 2019-10-12 MED ORDER — METOPROLOL TARTRATE 25 MG PO TABS: 25.0000 mg | ORAL_TABLET | Freq: Two times a day (BID) | ORAL | 1 refills | Status: DC

## 2019-10-12 MED ORDER — OXYCODONE HCL 5 MG PO TABS: 5.0000 mg | ORAL_TABLET | Freq: Four times a day (QID) | ORAL | 0 refills | Status: DC | PRN

## 2019-10-12 MED ORDER — AMIODARONE HCL 200 MG PO TABS: 200.0000 mg | ORAL_TABLET | Freq: Two times a day (BID) | ORAL | 1 refills | Status: DC

## 2019-10-12 MED ORDER — APIXABAN 5 MG PO TABS: 5.0000 mg | ORAL_TABLET | Freq: Two times a day (BID) | ORAL | 1 refills | Status: DC

## 2019-10-12 MED FILL — AMIODARONE HCL 200 MG TAB: 200 | 30 days supply | Qty: 60 | Fill #0

## 2019-10-12 MED FILL — oxyCODONE HCL 5 MG TABS: 5 | 7 days supply | Qty: 30 | Fill #0

## 2019-10-12 MED FILL — ELIQUIS 5 MG TABLET: 5 | 30 days supply | Qty: 60 | Fill #0

## 2019-10-12 MED FILL — METOPROLOL TARTRATE 25 MG T: 25 | 30 days supply | Qty: 60 | Fill #0

## 2019-10-12 NOTE — Progress Notes (Signed)
Order received to discharge patient.  Telemetry monitor removed and CCMD notified.  PIV access removed.  Discharge instructions, follow up, medications and instructions for their use discussed with patient. 

## 2019-10-12 NOTE — Progress Notes (Addendum)
      [address]       ,[place] [address]             [phone]      8 Days Post-Op Procedure(s) (LRB): CORONARY ARTERY BYPASS GRAFTING (CABG) times three using right greater saphenous vein and left internal mammary  (N/A) TRANSESOPHAGEAL ECHOCARDIOGRAM (TEE) (N/A) Excision Of Sternal Keloid (N/A) Endovein Harvest Of Greater Saphenous Vein (Right) Subjective: Feels good this morning. No complaints.   Objective: Vital signs in last 24 hours: Temp:  [98.2 F (36.8 C)-99.4 F (37.4 C)] 99 F (37.2 C) (06/16 0352) Pulse Rate:  [71-86] 71 (06/15 2318) Cardiac Rhythm: Normal sinus rhythm (06/15 2015) Resp:  [18-20] 19 (06/16 0352) BP: (115-132)/(53-68) 124/54 (06/16 0352) SpO2:  [95 %-100 %] 96 % (06/16 0352) Weight:  [95.8 kg] 95.8 kg (06/16 0552)     Intake/Output from previous day: 06/15 0701 - 06/16 0700 In: -  Out: 800 [Urine:800] Intake/Output this shift: No intake/output data recorded.  General appearance: alert, cooperative and no distress Heart: regular rate and rhythm, S1, S2 normal, no murmur, click, rub or gallop Lungs: clear to auscultation bilaterally Abdomen: soft, non-tender; bowel sounds normal; no masses,  no organomegaly Extremities:1+ pitting edema in lower ext Wound: clean and dry, sutures in place  Lab Results: No results for input(s): WBC, HGB, HCT, PLT in the last 72 hours. BMET:  Recent Labs    10/10/19 0514 10/11/19 0742  NA 127* 127*  K 2.9* 3.1*  CL 86* 88*  CO2 30 28  GLUCOSE 134* 116*  BUN 22 17  CREATININE 1.12 1.03  CALCIUM 8.3* 8.5*    PT/INR: No results for input(s): LABPROT, INR in the last 72 hours. ABG    Component Value Date/Time   PHART 7.386 10/05/2019 0554   HCO3 21.6 10/05/2019 0554   TCO2 23 10/05/2019 0554   ACIDBASEDEF 3.0 (H) 10/05/2019 0554   O2SAT 94.0 10/05/2019 0554   CBG (last 3)  No results for input(s): GLUCAP in the last 72 hours.  Assessment/Plan: S/P Procedure(s)  (LRB): CORONARY ARTERY BYPASS GRAFTING (CABG) times three using right greater saphenous vein and left internal mammary  (N/A) TRANSESOPHAGEAL ECHOCARDIOGRAM (TEE) (N/A) Excision Of Sternal Keloid (N/A) Endovein Harvest Of Greater Saphenous Vein (Right)  1. CV-No more Afib overnight. Continue Amio, Eliquis/asa, and metoprolol. He is NSR in the 90s this morning.  2. Pulm- tolerating room air with excellent saturation. 3. Renal-creatinine is 1.03, hypokalemia-3.1 yesterday, replace. He is below his baseline weight so, no Lasix for home.  4. H and H 11.2/31.8, expected acute blood loss anemia 5. Endo-blood glucose well controlled. 6. GI- + bowel movement 7. Swallow eval recommended regular solids and thin liquids  Plan: Discharge instructions explained to the patient. Keep sternal sutures in place until he sees Dr. Prescott Gum in follow-up. Paint with betadine daily after showering.    LOS: 8 days    Elgie Collard 10/12/2019  DC instruction reviewed with patient- ready for discharge patient examined and medical record reviewed,agree with above note. Tharon Aquas Trigt III 10/12/2019

## 2019-10-12 NOTE — Progress Notes (Signed)
CARDIAC REHAB PHASE I   Reinforced d/c ed with pt. Reviewed importance of site care and monitoring incision daily. Encouraged continued IS use, walks, and sternal precautions. Pt referred to CRP II .   [id number] Rufina Falco, RN BSN 10/12/2019 8:49 AM

## 2019-10-12 NOTE — Progress Notes (Signed)
Chest tube sutures removed per MD order without difficulty. 

## 2019-10-12 NOTE — Care Management Important Message (Signed)
Important Message  Patient Details  Name: Britten Seyfried MRN: [id number] Date of Birth: 04-24-1946   Medicare Important Message Given:  Yes     Shelda Altes 10/12/2019, 10:30 AM

## 2019-10-14 ENCOUNTER — Telehealth (HOSPITAL_COMMUNITY): Payer: Self-pay

## 2019-10-14 NOTE — Telephone Encounter (Signed)
Faxed referral for Phase II cardiac rehab to [place]. °

## 2019-10-15 DIAGNOSIS — K5669 Other partial intestinal obstruction: Secondary | ICD-10-CM | POA: Diagnosis not present

## 2019-10-16 DIAGNOSIS — I480 Paroxysmal atrial fibrillation: Secondary | ICD-10-CM | POA: Diagnosis not present

## 2019-10-16 DIAGNOSIS — K5669 Other partial intestinal obstruction: Secondary | ICD-10-CM | POA: Diagnosis not present

## 2019-10-16 DIAGNOSIS — R778 Other specified abnormalities of plasma proteins: Secondary | ICD-10-CM | POA: Diagnosis not present

## 2019-10-17 DIAGNOSIS — R778 Other specified abnormalities of plasma proteins: Secondary | ICD-10-CM | POA: Diagnosis not present

## 2019-10-17 DIAGNOSIS — E785 Hyperlipidemia, unspecified: Secondary | ICD-10-CM

## 2019-10-17 DIAGNOSIS — Z951 Presence of aortocoronary bypass graft: Secondary | ICD-10-CM

## 2019-10-17 DIAGNOSIS — I480 Paroxysmal atrial fibrillation: Secondary | ICD-10-CM | POA: Diagnosis not present

## 2019-10-17 DIAGNOSIS — K56609 Unspecified intestinal obstruction, unspecified as to partial versus complete obstruction: Secondary | ICD-10-CM

## 2019-10-17 DIAGNOSIS — K5669 Other partial intestinal obstruction: Secondary | ICD-10-CM | POA: Diagnosis not present

## 2019-10-17 DIAGNOSIS — I2510 Atherosclerotic heart disease of native coronary artery without angina pectoris: Secondary | ICD-10-CM | POA: Diagnosis not present

## 2019-10-18 DIAGNOSIS — R778 Other specified abnormalities of plasma proteins: Secondary | ICD-10-CM | POA: Diagnosis not present

## 2019-10-18 DIAGNOSIS — Z951 Presence of aortocoronary bypass graft: Secondary | ICD-10-CM | POA: Diagnosis not present

## 2019-10-18 DIAGNOSIS — I2510 Atherosclerotic heart disease of native coronary artery without angina pectoris: Secondary | ICD-10-CM | POA: Diagnosis not present

## 2019-10-18 DIAGNOSIS — E785 Hyperlipidemia, unspecified: Secondary | ICD-10-CM | POA: Diagnosis not present

## 2019-10-18 DIAGNOSIS — K5669 Other partial intestinal obstruction: Secondary | ICD-10-CM | POA: Diagnosis not present

## 2019-10-19 DIAGNOSIS — Z951 Presence of aortocoronary bypass graft: Secondary | ICD-10-CM | POA: Diagnosis not present

## 2019-10-19 DIAGNOSIS — R778 Other specified abnormalities of plasma proteins: Secondary | ICD-10-CM | POA: Diagnosis not present

## 2019-10-19 DIAGNOSIS — I2510 Atherosclerotic heart disease of native coronary artery without angina pectoris: Secondary | ICD-10-CM | POA: Diagnosis not present

## 2019-10-19 DIAGNOSIS — E785 Hyperlipidemia, unspecified: Secondary | ICD-10-CM | POA: Diagnosis not present

## 2019-10-19 DIAGNOSIS — K5669 Other partial intestinal obstruction: Secondary | ICD-10-CM | POA: Diagnosis not present

## 2019-10-20 DIAGNOSIS — K5669 Other partial intestinal obstruction: Secondary | ICD-10-CM | POA: Diagnosis not present

## 2019-10-20 DIAGNOSIS — R778 Other specified abnormalities of plasma proteins: Secondary | ICD-10-CM | POA: Diagnosis not present

## 2019-10-20 DIAGNOSIS — E785 Hyperlipidemia, unspecified: Secondary | ICD-10-CM | POA: Diagnosis not present

## 2019-10-20 DIAGNOSIS — I2510 Atherosclerotic heart disease of native coronary artery without angina pectoris: Secondary | ICD-10-CM | POA: Diagnosis not present

## 2019-10-20 DIAGNOSIS — Z951 Presence of aortocoronary bypass graft: Secondary | ICD-10-CM | POA: Diagnosis not present

## 2019-10-21 ENCOUNTER — Telehealth: Payer: Self-pay

## 2019-10-21 DIAGNOSIS — K5669 Other partial intestinal obstruction: Secondary | ICD-10-CM | POA: Diagnosis not present

## 2019-10-21 NOTE — Telephone Encounter (Signed)
Pt calls to ask if he can take OTC cough syrup for nonproductive cough. He is s/p CABG x 3 by Dr. Prescott Gum on 10/04/19. Pt reports that he was just released from [facility] d/t GI issues/possible obstruction, and he had a CXR today. He states he had been given several doses of Robitussin while in the hospital and this had helped w/ cough. Informed that he could take prn as indicated on bottle. To call back if cough does not improve. Pt also inquires about using stool softener. Advised that from a surgical standpoint it is okay to take a stool softener, but recommended that he consult PCP or gastroenterologist regarding any type of GI medications d/t recent hospitalization.

## 2019-10-25 NOTE — Progress Notes (Addendum)
Cardiology Office Note:    Date:  10/26/2019   ID:  Divante Kotch, DOB December 31, 1945, MRN [id number]  PCP:  Street, Sharon Mt, MD  Cardiologist:  Shirlee More, MD    Referring MD: [address] [place] Mt, *    ASSESSMENT:    1. Coronary artery disease of native artery of native heart with stable angina pectoris ([place])   2. PAF (paroxysmal atrial fibrillation) ([place])   3. On amiodarone therapy   4. Hyperlipidemia, unspecified hyperlipidemia type    PLAN:    In order of problems listed above:  1. Slowly improving he has a wound that was resutured debrided followed by 6 cardiothoracic surgery he is on both aspirin and anticoagulant we will discontinue his Eliquis with a drug rash.  He has arrangements to enter cardiac rehab next week 2. Maintaining sinus rhythm reduce amiodarone 200 mg a day LV is long-term antiarrhythmic drug 3. Continue statin   Next appointment: 2 weeks   Medication Adjustments/Labs and Tests Ordered: Current medicines are reviewed at length with the patient today.  Concerns regarding medicines are outlined above.  No orders of the defined types were placed in this encounter.  No orders of the defined types were placed in this encounter.   Chief Complaint  Patient presents with  . Follow-up    Recent coronary artery bypass surgery    History of Present Illness:    Lucius Wise is a 74 y.o. male with a hx of CAD accessible atrial fibrillation with recent CABG 36/62/9476 complicated with postoperative atrial fibrillation responding to IV amiodarone loading.  His last EKG 060 01/2020 showed rapid atrial fibrillation. Compliance with diet, lifestyle and medications: Yes  He surprised me by saying that he had been at [facility] for several days with an ileus.  Since then he has a diffuse papular rash is not pruritic is not urticaria and is likely related to his Eliquis will check an EKG if maintaining sinus rhythm discontinue.  He is also on amiodarone but  this is not typical for causing rash reduce to once a day.  He is slowly improving bowel symptoms have resolved.  He has exertional shortness of breath he is walking 3 times a day 10 minutes he still has not slept in the bed.  He is having no orthopnea but he complains of a cough saw his primary care physician and was placed on an antibiotic.  ED from [facility] 10/27/2019.  CT scan showed findings of partial small bowel obstruction he had small bilateral pleural effusions left greater than right and had atelectasis or infiltrate at the left base.  He also had a small pericardial effusion.  He was seen by Dr. Selinda Flavin in consultation laboratory studies show an elevated white count 16,600 mildly anemic hemoglobin 10.4 renal function was normal troponin was minimally elevated nonspecific not felt to have acute coronary syndrome he was in sinus rhythm and was continued on amiodarone.  There is no mention of rash in the hospital records.  He had an echocardiogram done 10/17/2019 which showed grossly normal left ventricular size and function and impaired relaxation. Past Medical History:  Diagnosis Date  . Arthritis 01/15/2017  . Atrial fibrillation ([place])   . Cancer ([place])    skin cancer  . Chest mass 12/04/2015  . Coronary artery disease   . Cutaneous abscess of chest wall 12/04/2015  . Dyspnea   . Dysrhythmia    afib  . Epidermoid cyst of skin 12/04/2015  . GERD (gastroesophageal  reflux disease) 01/15/2017  . High risk medication use 01/10/2015   Overview:  Overview:  flecanide  . Hyperlipidemia 01/15/2017  . LAE (left atrial enlargement) 01/15/2017  . Open wound of chest wall 04/01/2016  . PAF (paroxysmal atrial fibrillation) ([place]) 01/10/2015   Overview:  CHADS2 vasc score=1    Past Surgical History:  Procedure Laterality Date  . APPENDECTOMY    . CARDIAC CATHETERIZATION     09/23/19  . CHOLECYSTECTOMY    . CORONARY ARTERY BYPASS GRAFT N/A 10/04/2019   Procedure: CORONARY ARTERY BYPASS  GRAFTING (CABG) times three using right greater saphenous vein and left internal mammary ;  Surgeon: Ivin Poot, MD;  Location: [place];  Service: Open Heart Surgery;  Laterality: N/A;  . CYST EXCISION    . ENDOVEIN HARVEST OF GREATER SAPHENOUS VEIN Right 10/04/2019   Procedure: [name] Of Greater Saphenous Vein;  Surgeon: Ivin Poot, MD;  Location: [place];  Service: Open Heart Surgery;  Laterality: Right;  . EXCISION OF KELOID N/A 10/04/2019   Procedure: Excision Of Sternal Keloid;  Surgeon: Ivin Poot, MD;  Location: [place];  Service: Open Heart Surgery;  Laterality: N/A;  . LAPAROSCOPIC LYSIS INTESTINAL ADHESIONS  2012  . LEFT HEART CATH AND CORONARY ANGIOGRAPHY N/A 09/23/2019   Procedure: LEFT HEART CATH AND CORONARY ANGIOGRAPHY;  Surgeon: Belva Crome, MD;  Location: [place] CV [facility];  Service: Cardiovascular;  Laterality: N/A;  . TEE WITHOUT CARDIOVERSION N/A 10/04/2019   Procedure: TRANSESOPHAGEAL ECHOCARDIOGRAM (TEE);  Surgeon: Prescott Gum, Collier Salina, MD;  Location: [place];  Service: Open Heart Surgery;  Laterality: N/A;    Current Medications: Current Meds  Medication Sig  . amiodarone (PACERONE) 200 MG tablet Take 200 mg by mouth daily.  Marland Kitchen aspirin EC 81 MG tablet Take 81 mg daily by mouth.  . famotidine (PEPCID) 40 MG tablet TAKE 1 TABLET BY MOUTH EVERY DAY (Patient taking differently: Take 40 mg by mouth daily. )  . finasteride (PROSCAR) 5 MG tablet Take 5 mg daily by mouth.  . metoprolol tartrate (LOPRESSOR) 25 MG tablet Take 1 tablet (25 mg total) by mouth 2 (two) times daily.  . Nutritional Supplements (FRUIT & VEGETABLE DAILY PO) Take 2 tablets daily by mouth.  . oxyCODONE (OXY IR/ROXICODONE) 5 MG immediate release tablet Take 1 tablet (5 mg total) by mouth every 6 (six) hours as needed for severe pain.  . Probiotic Product (ALIGN PO) Take 1 tablet by mouth daily.  . simvastatin (ZOCOR) 40 MG tablet TAKE 1 TABLET BY MOUTH EVERY DAY (Patient taking differently: Take  40 mg by mouth at bedtime. )  . triamcinolone ointment (KENALOG) 0.1 % Apply 1 application topically 3 (three) times daily as needed (Dry skin).   . [DISCONTINUED] amiodarone (PACERONE) 200 MG tablet Take 1 tablet (200 mg total) by mouth 2 (two) times daily.  . [DISCONTINUED] apixaban (ELIQUIS) 5 MG TABS tablet Take 1 tablet (5 mg total) by mouth 2 (two) times daily.  . [DISCONTINUED] triamcinolone (NASACORT ALLERGY 24HR) 55 MCG/ACT AERO nasal inhaler Place 2 sprays into the nose at bedtime.      Allergies:   Penicillins   Social History   Socioeconomic History  . Marital status: Married    Spouse name: Not on file  . Number of children: Not on file  . Years of education: Not on file  . Highest education level: Not on file  Occupational History  . Not on file  Tobacco Use  . Smoking  status: Former Smoker    Years: 20.00    Types: Cigarettes    Quit date: 01/10/1979    Years since quitting: 40.8  . Smokeless tobacco: Never Used  Vaping Use  . Vaping Use: Never used  Substance and Sexual Activity  . Alcohol use: No  . Drug use: No  . Sexual activity: Not on file  Other Topics Concern  . Not on file  Social History Narrative  . Not on file   Social Determinants of Health   Financial Resource Strain:   . Difficulty of Paying Living Expenses:   Food Insecurity:   . Worried About Charity fundraiser in the Last Year:   . Arboriculturist in the Last Year:   Transportation Needs:   . Film/video editor (Medical):   Marland Kitchen Lack of Transportation (Non-Medical):   Physical Activity:   . Days of Exercise per Week:   . Minutes of Exercise per Session:   Stress:   . Feeling of Stress :   Social Connections:   . Frequency of Communication with Friends and Family:   . Frequency of Social Gatherings with Friends and Family:   . Attends Religious Services:   . Active Member of Clubs or Organizations:   . Attends Archivist Meetings:   Marland Kitchen Marital Status:      Family  History: The patient's family history includes Heart disease in his father; Heart failure in his father. ROS:   Please see the history of present illness.    All other systems reviewed and are negative.  EKGs/Labs/Other Studies Reviewed:    The following studies were reviewed today:   Recent Labs: 08/11/2019: NT-Pro BNP 178 09/29/2019: ALT 29 10/05/2019: Magnesium 1.8 10/09/2019: Hemoglobin 11.2; Platelets 289 10/11/2019: BUN 17; Creatinine, Ser 1.03; Potassium 3.1; Sodium 127  Recent Lipid Panel    Component Value Date/Time   CHOL 150 03/12/2017 1034   TRIG 122 03/12/2017 1034   HDL 40 03/12/2017 1034   CHOLHDL 3.8 03/12/2017 1034   LDLCALC 86 03/12/2017 1034    Physical Exam:    VS:  BP 132/68 (BP Location: Left Arm, Patient Position: Sitting, Cuff Size: Normal)   Pulse 72   Ht 6' (1.829 m)   Wt 209 lb 9.6 oz (95.1 kg)   SpO2 97%   BMI 28.43 kg/m     Wt Readings from Last 3 Encounters:  10/26/19 209 lb 9.6 oz (95.1 kg)  10/12/19 211 lb 3.2 oz (95.8 kg)  09/29/19 213 lb 9.6 oz (96.9 kg)     GEN: He is a little apprehensive well nourished, well developed in no acute distress HEENT: Normal NECK: No JVD; No carotid bruits LYMPHATICS: No lymphadenopathy CARDIAC: RRR, no murmurs, rubs, gallops RESPIRATORY:  Clear to auscultation without rales, wheezing or rhonchi  ABDOMEN: Soft, non-tender, non-distended MUSCULOSKELETAL: 1-2+ edema right lower extremity after venous stripping edema; No deformity  SKIN: Warm and dry NEUROLOGIC:  Alert and oriented x 3 PSYCHIATRIC:  Normal affect    Signed, Shirlee More, MD  10/26/2019 1:58 PM    [facility]

## 2019-10-26 ENCOUNTER — Ambulatory Visit (INDEPENDENT_AMBULATORY_CARE_PROVIDER_SITE_OTHER): Payer: Medicare Other | Admitting: Cardiology

## 2019-10-26 ENCOUNTER — Other Ambulatory Visit: Payer: Self-pay

## 2019-10-26 ENCOUNTER — Encounter: Payer: Self-pay | Admitting: Cardiology

## 2019-10-26 ENCOUNTER — Ambulatory Visit: Payer: Medicare Other | Admitting: Cardiology

## 2019-10-26 VITALS — BP 132/68 | HR 72 | Ht 72.0 in | Wt 209.6 lb

## 2019-10-26 DIAGNOSIS — R0602 Shortness of breath: Secondary | ICD-10-CM

## 2019-10-26 DIAGNOSIS — I480 Paroxysmal atrial fibrillation: Secondary | ICD-10-CM

## 2019-10-26 DIAGNOSIS — I25118 Atherosclerotic heart disease of native coronary artery with other forms of angina pectoris: Secondary | ICD-10-CM | POA: Diagnosis not present

## 2019-10-26 DIAGNOSIS — E785 Hyperlipidemia, unspecified: Secondary | ICD-10-CM | POA: Diagnosis not present

## 2019-10-26 DIAGNOSIS — Z79899 Other long term (current) drug therapy: Secondary | ICD-10-CM

## 2019-10-26 NOTE — Patient Instructions (Addendum)
Medication Instructions:  Your physician has recommended you make the following change in your medication:  STOP: Eliquis  DECREASE: Amiodarone 200 mg take one tablet by mouth daily.  *If you need a refill on your cardiac medications before your next appointment, please call your pharmacy*   Lab Work: Your physician recommends that you return for lab work in: TODAY BMP, ProBNP, TSH, T3,T4 If you have labs (blood work) drawn today and your tests are completely normal, you will receive your results only by: Marland Kitchen MyChart Message (if you have MyChart) OR . A paper copy in the mail If you have any lab test that is abnormal or we need to change your treatment, we will call you to review the results.   Testing/Procedures: None   Follow-Up: At [facility], you and your health needs are our priority.  As part of our continuing mission to provide you with exceptional heart care, we have created designated Provider Care Teams.  These Care Teams include your primary Cardiologist (physician) and Advanced Practice Providers (APPs -  Physician Assistants and Nurse Practitioners) who all work together to provide you with the care you need, when you need it.  We recommend signing up for the patient portal called "MyChart".  Sign up information is provided on this After Visit Summary.  MyChart is used to connect with patients for Virtual Visits (Telemedicine).  Patients are able to view lab/test results, encounter notes, upcoming appointments, etc.  Non-urgent messages can be sent to your provider as well.   To learn more about what you can do with MyChart, go to [email or web address].    Your next appointment:   2 week(s)  The format for your next appointment:   In Person  Provider:   Shirlee More, MD   Other Instructions

## 2019-10-27 ENCOUNTER — Telehealth: Payer: Self-pay

## 2019-10-27 DIAGNOSIS — I480 Paroxysmal atrial fibrillation: Secondary | ICD-10-CM

## 2019-10-27 DIAGNOSIS — R0602 Shortness of breath: Secondary | ICD-10-CM

## 2019-10-27 LAB — BASIC METABOLIC PANEL
BUN/Creatinine Ratio: 9 — ABNORMAL LOW (ref 10–24)
BUN: 7 mg/dL — ABNORMAL LOW (ref 8–27)
CO2: 24 mmol/L (ref 20–29)
Calcium: 8.8 mg/dL (ref 8.6–10.2)
Chloride: 100 mmol/L (ref 96–106)
Creatinine, Ser: 0.81 mg/dL (ref 0.76–1.27)
GFR calc Af Amer: 101 mL/min/{1.73_m2} (ref >59)
GFR calc non Af Amer: 88 mL/min/{1.73_m2} (ref >59)
Glucose: 101 mg/dL — ABNORMAL HIGH (ref 65–99)
Potassium: 4.9 mmol/L (ref 3.5–5.2)
Sodium: 136 mmol/L (ref 134–144)

## 2019-10-27 LAB — TSH+T4F+T3FREE
Free T4: 1.77 ng/dL (ref 0.82–1.77)
T3, Free: 2.1 pg/mL (ref 2.0–4.4)
TSH: 0.79 u[IU]/mL (ref 0.450–4.500)

## 2019-10-27 LAB — PRO B NATRIURETIC PEPTIDE: NT-Pro BNP: 759 pg/mL — ABNORMAL HIGH (ref 0–376)

## 2019-10-27 MED ORDER — FUROSEMIDE 20 MG PO TABS: 20.0000 mg | ORAL_TABLET | Freq: Every day | ORAL | 3 refills | Status: DC

## 2019-10-27 NOTE — Telephone Encounter (Signed)
-----   Message from Richardo Priest, MD sent at 10/27/2019 10:30 AM EDT ----- After reviewing his [place] records I like to put on a low-dose of a loop diuretic furosemide 20 mg daily.  We will need to get a BMP and proBNP in 2 weeks

## 2019-10-27 NOTE — Telephone Encounter (Signed)
Spoke with patient regarding recommendation.  Patient verbalizes understanding and is agreeable to plan of care. Advised patient to call back with any issues or concerns.   

## 2019-10-27 NOTE — Telephone Encounter (Signed)
Spoke with patient regarding results and recommendation.  Patient verbalizes understanding and is agreeable to plan of care. Advised patient to call back with any issues or concerns.  

## 2019-11-01 ENCOUNTER — Other Ambulatory Visit: Payer: Self-pay | Admitting: Cardiothoracic Surgery

## 2019-11-01 ENCOUNTER — Telehealth: Payer: Self-pay

## 2019-11-01 DIAGNOSIS — Z951 Presence of aortocoronary bypass graft: Secondary | ICD-10-CM

## 2019-11-01 NOTE — Telephone Encounter (Signed)
Patient contacted the office again stating that his cough has not subsided since calling 2 weeks ago.  He stated that he has been taking the Robitussin and it helps for a few hours, but is only temporary.  He stated that his Cardiologist did put him on a diuretic, furosemide, and helped with lower extremity swelling, but his cough is still there.  He stated that his PCP started a weeks worth of antibiotics, Levofloxacin, but his cough is still present.  Patient was advised that he could continue to take Robitussin, as directed on bottle until he comes for an appointment with Dr. Prescott Gum on Thursday, 11/03/19.  He will get a chest xray at this appointment.  Also advised, if his chest xray was clear, he would need to contact his PCP for a follow-up.  He acknowledged receipt.  Patient stated that he was not short of breath and had no other concerning symptoms.

## 2019-11-02 ENCOUNTER — Encounter: Payer: Medicare Other | Admitting: Cardiothoracic Surgery

## 2019-11-03 ENCOUNTER — Other Ambulatory Visit: Payer: Self-pay

## 2019-11-03 ENCOUNTER — Ambulatory Visit
Admission: RE | Admit: 2019-11-03 | Discharge: 2019-11-03 | Disposition: A | Discharge status: Home / Self Care | Payer: Medicare Other | Source: Ambulatory Visit | Attending: Cardiothoracic Surgery | Admitting: Cardiothoracic Surgery

## 2019-11-03 ENCOUNTER — Ambulatory Visit: Payer: Self-pay | Admitting: Allergy and Immunology

## 2019-11-03 ENCOUNTER — Encounter: Payer: Self-pay | Admitting: Cardiothoracic Surgery

## 2019-11-03 ENCOUNTER — Ambulatory Visit (INDEPENDENT_AMBULATORY_CARE_PROVIDER_SITE_OTHER): Payer: Self-pay | Admitting: Cardiothoracic Surgery

## 2019-11-03 DIAGNOSIS — Z09 Encounter for follow-up examination after completed treatment for conditions other than malignant neoplasm: Secondary | ICD-10-CM

## 2019-11-03 DIAGNOSIS — Z951 Presence of aortocoronary bypass graft: Secondary | ICD-10-CM

## 2019-11-03 HISTORY — DX: Encounter for follow-up examination after completed treatment for conditions other than malignant neoplasm: Z09

## 2019-11-03 MED ORDER — FUROSEMIDE 40 MG PO TABS: 40.0000 mg | ORAL_TABLET | Freq: Every day | ORAL | 1 refills | Status: DC

## 2019-11-03 NOTE — Progress Notes (Signed)
PCP is Street, Sharon Mt, MD Referring Provider is Street, Sharon Mt, *  Chief Complaint  Patient presents with  . Routine Post Op    s/p CABG x 3 on 10/04/19, CXR today    HPI: Patient returns for 1 month postop scheduled follow-up after CABG.  He had a PA and lateral chest x-ray taken today which I viewed personally.  He is doing well and will be starting outpatient cardiac rehab at [facility] next week.  He is walking 20 minutes daily. Surgical incisions are healing well. Chest x-ray performed today shows a small left basilar pleural effusion for which he will have an extended course of oral Lasix He is maintaining sinus rhythm and he will finish his postop amiodarone dosing-200 mg/day. The patient understands he can drive and lift up to 10 pounds but no more until his next office visit.  Past Medical History:  Diagnosis Date  . Arthritis 01/15/2017  . Atrial fibrillation ([place])   . Cancer ([place])    skin cancer  . Chest mass 12/04/2015  . Coronary artery disease   . Cutaneous abscess of chest wall 12/04/2015  . Dyspnea   . Dysrhythmia    afib  . Epidermoid cyst of skin 12/04/2015  . GERD (gastroesophageal reflux disease) 01/15/2017  . High risk medication use 01/10/2015   Overview:  Overview:  flecanide  . Hyperlipidemia 01/15/2017  . LAE (left atrial enlargement) 01/15/2017  . Open wound of chest wall 04/01/2016  . PAF (paroxysmal atrial fibrillation) ([place]) 01/10/2015   Overview:  CHADS2 vasc score=1    Past Surgical History:  Procedure Laterality Date  . APPENDECTOMY    . CARDIAC CATHETERIZATION     09/23/19  . CHOLECYSTECTOMY    . CORONARY ARTERY BYPASS GRAFT N/A 10/04/2019   Procedure: CORONARY ARTERY BYPASS GRAFTING (CABG) times three using right greater saphenous vein and left internal mammary ;  Surgeon: Ivin Poot, MD;  Location: [place];  Service: Open Heart Surgery;  Laterality: N/A;  . CYST EXCISION    . ENDOVEIN HARVEST OF GREATER SAPHENOUS VEIN Right  10/04/2019   Procedure: [name] Ropes Of Greater Saphenous Vein;  Surgeon: Ivin Poot, MD;  Location: [place];  Service: Open Heart Surgery;  Laterality: Right;  . EXCISION OF KELOID N/A 10/04/2019   Procedure: Excision Of Sternal Keloid;  Surgeon: Ivin Poot, MD;  Location: [place];  Service: Open Heart Surgery;  Laterality: N/A;  . LAPAROSCOPIC LYSIS INTESTINAL ADHESIONS  2012  . LEFT HEART CATH AND CORONARY ANGIOGRAPHY N/A 09/23/2019   Procedure: LEFT HEART CATH AND CORONARY ANGIOGRAPHY;  Surgeon: Belva Crome, MD;  Location: [place] [facility];  Service: Cardiovascular;  Laterality: N/A;  . TEE WITHOUT CARDIOVERSION N/A 10/04/2019   Procedure: TRANSESOPHAGEAL ECHOCARDIOGRAM (TEE);  Surgeon: Prescott Gum, Collier Salina, MD;  Location: [place];  Service: Open Heart Surgery;  Laterality: N/A;    Family History  Problem Relation Age of Onset  . Heart disease Father   . Heart failure Father     Social History Social History   Tobacco Use  . Smoking status: Former Smoker    Years: 20.00    Types: Cigarettes    Quit date: 01/10/1979    Years since quitting: 40.8  . Smokeless tobacco: Never Used  Vaping Use  . Vaping Use: Never used  Substance Use Topics  . Alcohol use: No  . Drug use: No    Current Outpatient Medications  Medication Sig Dispense Refill  .  amiodarone (PACERONE) 200 MG tablet Take 200 mg by mouth daily.    Marland Kitchen aspirin EC 81 MG tablet Take 81 mg daily by mouth.    . famotidine (PEPCID) 40 MG tablet TAKE 1 TABLET BY MOUTH EVERY DAY (Patient taking differently: Take 40 mg by mouth daily. ) 90 tablet 1  . finasteride (PROSCAR) 5 MG tablet Take 5 mg daily by mouth.  2  . furosemide (LASIX) 20 MG tablet Take 1 tablet (20 mg total) by mouth daily. 90 tablet 3  . metoprolol tartrate (LOPRESSOR) 25 MG tablet Take 1 tablet (25 mg total) by mouth 2 (two) times daily. 60 tablet 1  . Nutritional Supplements (FRUIT & VEGETABLE DAILY PO) Take 2 tablets daily by mouth.    . oxyCODONE  (OXY IR/ROXICODONE) 5 MG immediate release tablet Take 1 tablet (5 mg total) by mouth every 6 (six) hours as needed for severe pain. 30 tablet 0  . Probiotic Product (ALIGN PO) Take 1 tablet by mouth daily.    . simvastatin (ZOCOR) 40 MG tablet TAKE 1 TABLET BY MOUTH EVERY DAY (Patient taking differently: Take 40 mg by mouth at bedtime. ) 90 tablet 0  . triamcinolone ointment (KENALOG) 0.1 % Apply 1 application topically 3 (three) times daily as needed (Dry skin).      No current facility-administered medications for this visit.    Allergies  Allergen Reactions  . Penicillins Hives    In his 20's    Review of Systems  Strength and appetite improved Constipation resolved No fever, weight stable Complains of dry cough possibly related to the pleural effusion  BP 133/75 (BP Location: Left Arm, Patient Position: Sitting, Cuff Size: Normal)   Pulse 71   Temp 97.9 F (36.6 C) (Temporal)   Resp 20   Ht 6' (1.829 m)   Wt 199 lb (90.3 kg)   SpO2 99% Comment: RA  BMI 26.99 kg/m  Physical Exam      Exam    General- alert and comfortable.  Sternal incision well-healed.  Mild thigh hematoma from harvest site of his saphenous vein    Neck- no JVD, no cervical adenopathy palpable, no carotid bruit   Lungs- clear without rales, wheezes   Cor- regular rate and rhythm, no murmur , gallop   Abdomen- soft, non-tender   Extremities - warm, non-tender, minimal edema   Neuro- oriented, appropriate, no focal weakness   Diagnostic Tests: Chest x-ray with small left basilar pleural effusion  Impression: Excellent early recovery after multivessel CABG Finish current supply of amiodarone then stop Additional 10 days of oral Lasix for postoperative left pleural effusion and dry cough He will return in 8 weeks for final postoperative check for review of progress and discuss activity limits at that time.  Plan: Return with chest x-ray in 8 weeks-follow-up left pleural effusion  Tharon Aquas  Adelene Idler, MD [facility] [phone]

## 2019-11-03 NOTE — Progress Notes (Signed)
Pt has six sutures in the middle of sternal incision that are c/d/i. All surgical incisions are well-approximated and without s/s of infection. The six sutures are easily removed per standard protocol. Pt instructed to keep incision sites clean (with mild soap and water) and dry and to report any s/s of infection.

## 2019-11-10 ENCOUNTER — Telehealth: Payer: Self-pay

## 2019-11-10 LAB — BASIC METABOLIC PANEL
BUN/Creatinine Ratio: 10 (ref 10–24)
BUN: 8 mg/dL (ref 8–27)
CO2: 23 mmol/L (ref 20–29)
Calcium: 8.9 mg/dL (ref 8.6–10.2)
Chloride: 101 mmol/L (ref 96–106)
Creatinine, Ser: 0.83 mg/dL (ref 0.76–1.27)
GFR calc Af Amer: 100 mL/min/{1.73_m2} (ref >59)
GFR calc non Af Amer: 87 mL/min/{1.73_m2} (ref >59)
Glucose: 102 mg/dL — ABNORMAL HIGH (ref 65–99)
Potassium: 4.1 mmol/L (ref 3.5–5.2)
Sodium: 137 mmol/L (ref 134–144)

## 2019-11-10 LAB — PRO B NATRIURETIC PEPTIDE: NT-Pro BNP: 540 pg/mL — ABNORMAL HIGH (ref 0–376)

## 2019-11-10 NOTE — Telephone Encounter (Signed)
Spoke with patient regarding results and recommendation.  Patient verbalizes understanding and is agreeable to plan of care. Advised patient to call back with any issues or concerns.  

## 2019-11-10 NOTE — Telephone Encounter (Signed)
-----   Message from Richardo Priest, MD sent at 11/10/2019  8:51 AM EDT ----- Normal or stable result  No changes

## 2019-11-29 ENCOUNTER — Other Ambulatory Visit: Payer: Self-pay | Admitting: Physician Assistant

## 2019-12-01 ENCOUNTER — Other Ambulatory Visit: Payer: Self-pay

## 2019-12-01 ENCOUNTER — Encounter: Payer: Self-pay | Admitting: Cardiology

## 2019-12-01 ENCOUNTER — Ambulatory Visit (INDEPENDENT_AMBULATORY_CARE_PROVIDER_SITE_OTHER): Payer: Medicare Other | Admitting: Cardiology

## 2019-12-01 VITALS — BP 124/62 | HR 56 | Ht 72.0 in | Wt 202.6 lb

## 2019-12-01 DIAGNOSIS — I480 Paroxysmal atrial fibrillation: Secondary | ICD-10-CM

## 2019-12-01 DIAGNOSIS — E785 Hyperlipidemia, unspecified: Secondary | ICD-10-CM | POA: Diagnosis not present

## 2019-12-01 DIAGNOSIS — I25118 Atherosclerotic heart disease of native coronary artery with other forms of angina pectoris: Secondary | ICD-10-CM

## 2019-12-01 DIAGNOSIS — Z79899 Other long term (current) drug therapy: Secondary | ICD-10-CM

## 2019-12-01 NOTE — Progress Notes (Signed)
Cardiology Office Note:    Date:  12/01/2019   ID:  Steve Frye, DOB [birth year], MRN [id number]  PCP:  Street, Sharon Mt, MD  Cardiologist:  Shirlee More, MD    Referring MD: [address], Sharon Mt, *    ASSESSMENT:    1. Coronary artery disease of native artery of native heart with stable angina pectoris ([place])   2. PAF (paroxysmal atrial fibrillation) ([place])   3. Hyperlipidemia, unspecified hyperlipidemia type   4. On amiodarone therapy    PLAN:    In order of problems listed above:  1. He continues to improve stable free of angina after bypass surgery and will continue on cardiac rehabilitation current medications aspirin beta-blocker and statin. 2. High risk of recurrence and not 1 to put him back on flecainide continue low-dose amiodarone cycle labs for toxicity today 3. Continue statin check lipid profile LDL goal less than 70   Next appointment: 3 months   Medication Adjustments/Labs and Tests Ordered: Current medicines are reviewed at length with the patient today.  Concerns regarding medicines are outlined above.  Orders Placed This Encounter  Procedures  . Lipid panel  . Comprehensive metabolic panel  . TSH+T4F+T3Free   No orders of the defined types were placed in this encounter.   Chief Complaint  Patient presents with  . Follow-up  . Coronary Artery Disease    History of Present Illness:    Steve Frye is a 74 y.o. male with a hx of CAD CABG 10/04/2019 paroxysmal atrial fibrillation treated with IV and subsequent oral amiodarone last seen 10/26/2019.  This visit he had urticaria related to Eliquis discontinued Compliance with diet, lifestyle and medications: Yes  He continues to improve after bypass surgery engaged in cardiac rehab.  His urticaria resolved off Eliquis and his cough went away with an inhaler and a short course of steroids.  He is having no shortness of breath chest pain edema and no recurrent atrial fibrillation.  Prior to surgery he  was on flecainide stopped with his severe CAD and as opposed to withdrawing amiodarone we will keep him on 200 mg/day and start to cycle labs for toxicity including CMP thyroids and check a lipid profile.  He uses a sunscreen. Past Medical History:  Diagnosis Date  . Arthritis 01/15/2017  . Atrial fibrillation ([place])   . Cancer ([place])    skin cancer  . Chest mass 12/04/2015  . Coronary artery disease   . Cutaneous abscess of chest wall 12/04/2015  . Dyspnea   . Dysrhythmia    afib  . Epidermoid cyst of skin 12/04/2015  . GERD (gastroesophageal reflux disease) 01/15/2017  . High risk medication use 01/10/2015   Overview:  Overview:  flecanide  . Hyperlipidemia 01/15/2017  . LAE (left atrial enlargement) 01/15/2017  . Open wound of chest wall 04/01/2016  . PAF (paroxysmal atrial fibrillation) ([place]) 01/10/2015   Overview:  CHADS2 vasc score=1    Past Surgical History:  Procedure Laterality Date  . APPENDECTOMY    . CARDIAC CATHETERIZATION     09/23/19  . CHOLECYSTECTOMY    . CORONARY ARTERY BYPASS GRAFT N/A 10/04/2019   Procedure: CORONARY ARTERY BYPASS GRAFTING (CABG) times three using right greater saphenous vein and left internal mammary ;  Surgeon: Ivin Poot, MD;  Location: [facility];  Service: Open Heart Surgery;  Laterality: N/A;  . CYST EXCISION    . ENDOVEIN HARVEST OF GREATER SAPHENOUS VEIN Right 10/04/2019   Procedure: [name] Ropes Of Greater Saphenous Vein;  Surgeon: Prescott Gum, Collier Salina, MD;  Location: [facility];  Service: Open Heart Surgery;  Laterality: Right;  . EXCISION OF KELOID N/A 10/04/2019   Procedure: Excision Of Sternal Keloid;  Surgeon: Ivin Poot, MD;  Location: [place] [facility];  Service: Open Heart Surgery;  Laterality: N/A;  . LAPAROSCOPIC LYSIS INTESTINAL ADHESIONS  2012  . LEFT HEART CATH AND CORONARY ANGIOGRAPHY N/A 09/23/2019   Procedure: LEFT HEART CATH AND CORONARY ANGIOGRAPHY;  Surgeon: Belva Crome, MD;  Location: [facility];  Service: Cardiovascular;   Laterality: N/A;  . TEE WITHOUT CARDIOVERSION N/A 10/04/2019   Procedure: TRANSESOPHAGEAL ECHOCARDIOGRAM (TEE);  Surgeon: Prescott Gum, Collier Salina, MD;  Location: [facility];  Service: Open Heart Surgery;  Laterality: N/A;    Current Medications: Current Meds  Medication Sig  . amiodarone (PACERONE) 200 MG tablet Take 200 mg by mouth daily.  Marland Kitchen aspirin EC 81 MG tablet Take 81 mg daily by mouth.  . famotidine (PEPCID) 40 MG tablet TAKE 1 TABLET BY MOUTH EVERY DAY  . finasteride (PROSCAR) 5 MG tablet Take 5 mg daily by mouth.  . metoprolol tartrate (LOPRESSOR) 25 MG tablet Take 1 tablet (25 mg total) by mouth 2 (two) times daily.  . Nutritional Supplements (FRUIT & VEGETABLE DAILY PO) Take 2 tablets daily by mouth.  . oxyCODONE (OXY IR/ROXICODONE) 5 MG immediate release tablet Take 1 tablet (5 mg total) by mouth every 6 (six) hours as needed for severe pain.  . Probiotic Product (ALIGN PO) Take 1 tablet by mouth daily.  . simvastatin (ZOCOR) 40 MG tablet TAKE 1 TABLET BY MOUTH EVERY DAY  . triamcinolone ointment (KENALOG) 0.1 % Apply 1 application topically 3 (three) times daily as needed (Dry skin).      Allergies:   Penicillins   Social History   Socioeconomic History  . Marital status: Married    Spouse name: Not on file  . Number of children: Not on file  . Years of education: Not on file  . Highest education level: Not on file  Occupational History  . Not on file  Tobacco Use  . Smoking status: Former Smoker    Years: 20.00    Types: Cigarettes    Quit date: 01/10/1979    Years since quitting: 40.9  . Smokeless tobacco: Never Used  Vaping Use  . Vaping Use: Never used  Substance and Sexual Activity  . Alcohol use: No  . Drug use: No  . Sexual activity: Not on file  Other Topics Concern  . Not on file  Social History Narrative  . Not on file   Social Determinants of Health   Financial Resource Strain:   . Difficulty of Paying Living Expenses:   Food Insecurity:   . Worried  About Charity fundraiser in the Last Year:   . Arboriculturist in the Last Year:   Transportation Needs:   . Film/video editor (Medical):   Marland Kitchen Lack of Transportation (Non-Medical):   Physical Activity:   . Days of Exercise per Week:   . Minutes of Exercise per Session:   Stress:   . Feeling of Stress :   Social Connections:   . Frequency of Communication with Friends and Family:   . Frequency of Social Gatherings with Friends and Family:   . Attends Religious Services:   . Active Member of Clubs or Organizations:   . Attends Archivist Meetings:   Marland Kitchen Marital Status:      Family  History: The patient's family history includes Heart disease in his father; Heart failure in his father. ROS:   Please see the history of present illness.    All other systems reviewed and are negative.  EKGs/Labs/Other Studies Reviewed:    The following studies were reviewed today: Recent Labs: 09/29/2019: ALT 29 10/05/2019: Magnesium 1.8 10/09/2019: Hemoglobin 11.2; Platelets 289 10/26/2019: TSH 0.790 11/09/2019: BUN 8; Creatinine, Ser 0.83; NT-Pro BNP 540; Potassium 4.1; Sodium 137  Recent Lipid Panel    Component Value Date/Time   CHOL 150 03/12/2017 1034   TRIG 122 03/12/2017 1034   HDL 40 03/12/2017 1034   CHOLHDL 3.8 03/12/2017 1034   LDLCALC 86 03/12/2017 1034    Physical Exam:    VS:  BP 124/62   Pulse (!) 56   Ht 6' (1.829 m)   Wt 202 lb 9.6 oz (91.9 kg)   SpO2 96%   BMI 27.48 kg/m     Wt Readings from Last 3 Encounters:  12/01/19 202 lb 9.6 oz (91.9 kg)  11/03/19 199 lb (90.3 kg)  10/26/19 209 lb 9.6 oz (95.1 kg)     GEN:  Well nourished, well developed in no acute distress HEENT: Normal NECK: No JVD; No carotid bruits LYMPHATICS: No lymphadenopathy CARDIAC: RRR, no murmurs, rubs, gallops RESPIRATORY:  Clear to auscultation without rales, wheezing or rhonchi  ABDOMEN: Soft, non-tender, non-distended MUSCULOSKELETAL:  No edema; No deformity  SKIN: Warm and  dry NEUROLOGIC:  Alert and oriented x 3 PSYCHIATRIC:  Normal affect    Signed, Shirlee More, MD  12/01/2019 2:31 PM    [facility]

## 2019-12-01 NOTE — Patient Instructions (Signed)
Medication Instructions:  Your physician recommends that you continue on your current medications as directed. Please refer to the Current Medication list given to you today.  *If you need a refill on your cardiac medications before your next appointment, please call your pharmacy*   Lab Work: Your physician recommends that you return for lab work in: TODAY CMP, Lipids, TSH, T3, T4 If you have labs (blood work) drawn today and your tests are completely normal, you will receive your results only by: . MyChart Message (if you have MyChart) OR . A paper copy in the mail If you have any lab test that is abnormal or we need to change your treatment, we will call you to review the results.   Testing/Procedures: None   Follow-Up: At [facility], you and your health needs are our priority.  As part of our continuing mission to provide you with exceptional heart care, we have created designated Provider Care Teams.  These Care Teams include your primary Cardiologist (physician) and Advanced Practice Providers (APPs -  Physician Assistants and Nurse Practitioners) who all work together to provide you with the care you need, when you need it.  We recommend signing up for the patient portal called "MyChart".  Sign up information is provided on this After Visit Summary.  MyChart is used to connect with patients for Virtual Visits (Telemedicine).  Patients are able to view lab/test results, encounter notes, upcoming appointments, etc.  Non-urgent messages can be sent to your provider as well.   To learn more about what you can do with MyChart, go to [email or web address].    Your next appointment:   3 month(s)  The format for your next appointment:   In Person  Provider:   Brian Munley, MD   Other Instructions   

## 2019-12-02 ENCOUNTER — Telehealth: Payer: Self-pay

## 2019-12-02 LAB — COMPREHENSIVE METABOLIC PANEL
ALT: 69 IU/L — ABNORMAL HIGH (ref 0–44)
AST: 32 IU/L (ref 0–40)
Albumin/Globulin Ratio: 1.5 (ref 1.2–2.2)
Albumin: 3.7 g/dL (ref 3.7–4.7)
Alkaline Phosphatase: 95 IU/L (ref 48–121)
BUN/Creatinine Ratio: 14 (ref 10–24)
BUN: 11 mg/dL (ref 8–27)
Bilirubin Total: 0.2 mg/dL (ref 0.0–1.2)
CO2: 25 mmol/L (ref 20–29)
Calcium: 8.9 mg/dL (ref 8.6–10.2)
Chloride: 103 mmol/L (ref 96–106)
Creatinine, Ser: 0.79 mg/dL (ref 0.76–1.27)
GFR calc Af Amer: 102 mL/min/{1.73_m2} (ref >59)
GFR calc non Af Amer: 88 mL/min/{1.73_m2} (ref >59)
Globulin, Total: 2.4 g/dL (ref 1.5–4.5)
Glucose: 105 mg/dL — ABNORMAL HIGH (ref 65–99)
Potassium: 4.3 mmol/L (ref 3.5–5.2)
Sodium: 139 mmol/L (ref 134–144)
Total Protein: 6.1 g/dL (ref 6.0–8.5)

## 2019-12-02 LAB — LIPID PANEL
Chol/HDL Ratio: 3.2 ratio (ref 0.0–5.0)
Cholesterol, Total: 135 mg/dL (ref 100–199)
HDL: 42 mg/dL (ref >39)
LDL Chol Calc (NIH): 72 mg/dL (ref 0–99)
Triglycerides: 114 mg/dL (ref 0–149)
VLDL Cholesterol Cal: 21 mg/dL (ref 5–40)

## 2019-12-02 LAB — TSH+T4F+T3FREE
Free T4: 1.87 ng/dL — ABNORMAL HIGH (ref 0.82–1.77)
T3, Free: 2.5 pg/mL (ref 2.0–4.4)
TSH: 0.576 u[IU]/mL (ref 0.450–4.500)

## 2019-12-02 NOTE — Telephone Encounter (Signed)
Spoke with patient regarding results and recommendation.  Patient verbalizes understanding and is agreeable to plan of care. Advised patient to call back with any issues or concerns.  

## 2019-12-02 NOTE — Telephone Encounter (Signed)
-----   Message from Berniece Salines, DO sent at 12/02/2019  8:21 AM EDT ----- Your electrolytes and kidney function is normal.  We will watch your liver enzyme your ALT is elevated.  TSH is normal, free T3 is normal however your free T4 is slightly elevated.  I will forward your thyroid function studies to your PCP.  But at this time there is nothing to do-no further testing.  We will repeat testing at your next visit.

## 2019-12-05 ENCOUNTER — Other Ambulatory Visit: Payer: Self-pay | Admitting: Cardiology

## 2019-12-05 MED ORDER — AMIODARONE HCL 200 MG PO TABS: 200.0000 mg | ORAL_TABLET | Freq: Every day | ORAL | 3 refills | Status: DC

## 2019-12-05 NOTE — Telephone Encounter (Signed)
Refill sent in per request.  

## 2019-12-05 NOTE — Telephone Encounter (Signed)
° ° °*  STAT* If patient is at the pharmacy, call can be transferred to refill team.   1. Which medications need to be refilled? (please list name of each medication and dose if known)   amiodarone (PACERONE) 200 MG tablet     2. Which pharmacy/location (including street and city if local pharmacy) is medication to be sent to? [id number] [place], [place] - [address]  3. Do they need a 30 day or 90 day supply? 90 days   Pt only have 2 pills left

## 2019-12-08 ENCOUNTER — Other Ambulatory Visit: Payer: Self-pay | Admitting: Cardiology

## 2019-12-08 MED ORDER — AMIODARONE HCL 200 MG PO TABS: 200.0000 mg | ORAL_TABLET | Freq: Every day | ORAL | 3 refills | Status: DC

## 2019-12-08 NOTE — Telephone Encounter (Signed)
Spoke with the patient just now and he [facility] for his am[facility]arone. He requested that it be sent again and this was done for him.    Encouraged patient to call back with any questions or concerns.

## 2019-12-08 NOTE — Telephone Encounter (Signed)
*  STAT* If patient is at the pharmacy, call can be transferred to refill team.   1. Which medications need to be refilled? (please list name of each medication and dose if known) Amiodarone 200 mg  2. Which pharmacy/location (including street and city if local pharmacy) is medication to be sent to? [facility] [place]  3. Do they need a 30 day or 90 day supply?  Would like to do 90

## 2019-12-15 ENCOUNTER — Ambulatory Visit (INDEPENDENT_AMBULATORY_CARE_PROVIDER_SITE_OTHER): Payer: Medicare Other | Admitting: Allergy and Immunology

## 2019-12-15 ENCOUNTER — Encounter: Payer: Self-pay | Admitting: Allergy and Immunology

## 2019-12-15 VITALS — BP 138/74 | HR 60 | Resp 18 | Ht 70.0 in | Wt 199.0 lb

## 2019-12-15 DIAGNOSIS — R0981 Nasal congestion: Secondary | ICD-10-CM | POA: Diagnosis not present

## 2019-12-15 DIAGNOSIS — J3089 Other allergic rhinitis: Secondary | ICD-10-CM

## 2019-12-15 DIAGNOSIS — I249 Acute ischemic heart disease, unspecified: Secondary | ICD-10-CM

## 2019-12-15 DIAGNOSIS — K219 Gastro-esophageal reflux disease without esophagitis: Secondary | ICD-10-CM | POA: Diagnosis not present

## 2019-12-15 MED ORDER — FAMOTIDINE 40 MG PO TABS: ORAL_TABLET | ORAL | 3 refills | Status: DC

## 2019-12-15 NOTE — Progress Notes (Signed)
[facility] [place] [facility] [place]   Follow-up Note  Referring Provider: Street, Steve Mt, * Primary Provider: Street, Steve Mt, MD Date of Office Visit: 12/15/2019  Subjective:   Steve Frye (DOB: [birth year]) is a 74 y.o. male who returns to the [facility] on 12/15/2019 in re-evaluation of the following:  HPI: Steve Frye returns to this clinic in evaluation of rhinitis and LPR.  I last saw him in this clinic on 04 May 2019.  Overall he thinks his nose is doing very well while using Nasacort on a consistent basis.  He no longer uses any nasal decongestant spray.  He thinks his throat is doing very well and he has had very little problems with reflux.  He continues on famotidine consistently.  He had open heart surgery 04 October 2019 with a triple bypass.  He did suffer from some cough since that event.  His cough was particularly bad for about 5 weeks.  He was given a 5-day course of steroids and a bronchodilator to be used as needed by Dr. Venetia Frye.  His cough is almost gone although he has a little bit of lingering cough that occurs sometimes throughout the day.  There does not appear to be any associated throat symptoms with this cough.  He does not really have any chest tightness and shortness of breath with this cough.  He has received 2 Moderna Covid vaccines.  Allergies as of 12/15/2019      Reactions   Penicillins Hives   In his 20's      Medication List    ALIGN PO Take 1 tablet by mouth daily.   amiodarone 200 MG tablet Commonly known as: PACERONE Take 1 tablet (200 mg total) by mouth daily.   aspirin EC 81 MG tablet Take 81 mg daily by mouth.   famotidine 40 MG tablet Commonly known as: PEPCID TAKE 1 TABLET BY MOUTH EVERY DAY   finasteride 5 MG tablet Commonly known as: PROSCAR Take 5 mg daily by mouth.   FRUIT & VEGETABLE DAILY PO Take 2 tablets daily by mouth.   metoprolol tartrate 25 MG tablet Commonly known  as: LOPRESSOR Take 1 tablet (25 mg total) by mouth 2 (two) times daily.   Nasacort Allergy 24HR 55 MCG/ACT Aero nasal inhaler Generic drug: triamcinolone Using two sprays in each nostril every night as directed.   ProAir HFA 108 (90 Base) MCG/ACT inhaler Generic drug: albuterol Inhale 2 puffs into the lungs every 4 (four) hours as needed for wheezing or shortness of breath.   simvastatin 40 MG tablet Commonly known as: ZOCOR TAKE 1 TABLET BY MOUTH EVERY DAY   triamcinolone ointment 0.1 % Commonly known as: KENALOG Apply 1 application topically 3 (three) times daily as needed (Dry skin).       Past Medical History:  Diagnosis Date  . Arthritis 01/15/2017  . Atrial fibrillation ([place])   . Cancer ([place])    skin cancer  . Chest mass 12/04/2015  . Coronary artery disease   . Cutaneous abscess of chest wall 12/04/2015  . Dyspnea   . Dysrhythmia    afib  . Epidermoid cyst of skin 12/04/2015  . GERD (gastroesophageal reflux disease) 01/15/2017  . High risk medication use 01/10/2015   Overview:  Overview:  flecanide  . Hyperlipidemia 01/15/2017  . LAE (left atrial enlargement) 01/15/2017  . Open wound of chest wall 04/01/2016  . PAF (paroxysmal atrial fibrillation) ([place]) 01/10/2015   Overview:  CHADS2 vasc score=1    Past Surgical History:  Procedure Laterality Date  . APPENDECTOMY    . CARDIAC CATHETERIZATION     09/23/19  . CHOLECYSTECTOMY    . CORONARY ARTERY BYPASS GRAFT N/A 10/04/2019   Procedure: CORONARY ARTERY BYPASS GRAFTING (CABG) times three using right greater saphenous vein and left internal mammary ;  Surgeon: Ivin Poot, MD;  Location: [facility];  Service: Open Heart Surgery;  Laterality: N/A;  . CYST EXCISION    . ENDOVEIN HARVEST OF GREATER SAPHENOUS VEIN Right 10/04/2019   Procedure: [name] Ropes Of Greater Saphenous Vein;  Surgeon: Ivin Poot, MD;  Location: [facility];  Service: Open Heart Surgery;  Laterality: Right;  . EXCISION OF KELOID N/A 10/04/2019    Procedure: Excision Of Sternal Keloid;  Surgeon: Ivin Poot, MD;  Location: [facility];  Service: Open Heart Surgery;  Laterality: N/A;  . LAPAROSCOPIC LYSIS INTESTINAL ADHESIONS  2012  . LEFT HEART CATH AND CORONARY ANGIOGRAPHY N/A 09/23/2019   Procedure: LEFT HEART CATH AND CORONARY ANGIOGRAPHY;  Surgeon: Belva Crome, MD;  Location: [facility];  Service: Cardiovascular;  Laterality: N/A;  . TEE WITHOUT CARDIOVERSION N/A 10/04/2019   Procedure: TRANSESOPHAGEAL ECHOCARDIOGRAM (TEE);  Surgeon: Prescott Gum, Collier Salina, MD;  Location: [facility];  Service: Open Heart Surgery;  Laterality: N/A;    Review of systems negative except as noted in HPI / PMHx or noted below:  Review of Systems  Constitutional: Negative.   HENT: Negative.   Eyes: Negative.   Respiratory: Negative.   Cardiovascular: Negative.   Gastrointestinal: Negative.   Genitourinary: Negative.   Musculoskeletal: Negative.   Skin: Negative.   Neurological: Negative.   Endo/Heme/Allergies: Negative.   Psychiatric/Behavioral: Negative.      Objective:   Vitals:   12/15/19 1331  BP: 138/74  Pulse: 60  Resp: 18  SpO2: 97%   Height: 5\' 10"  (177.8 cm)  Weight: 199 lb (90.3 kg)   Physical Exam Constitutional:      Appearance: He is not diaphoretic.  HENT:     Head: Normocephalic.     Right Ear: Tympanic membrane, ear canal and external ear normal.     Left Ear: Tympanic membrane, ear canal and external ear normal.     Nose: Nose normal. No mucosal edema or rhinorrhea.     Mouth/Throat:     Pharynx: Uvula midline. No oropharyngeal exudate.  Eyes:     Conjunctiva/sclera: Conjunctivae normal.  Neck:     Thyroid: No thyromegaly.     Trachea: Trachea normal. No tracheal tenderness or tracheal deviation.  Cardiovascular:     Rate and Rhythm: Normal rate and regular rhythm.     Heart sounds: Normal heart sounds, S1 normal and S2 normal. No murmur heard.   Pulmonary:     Effort: No respiratory distress.     Breath  sounds: Normal breath sounds. No stridor. No wheezing or rales.  Lymphadenopathy:     Head:     Right side of head: No tonsillar adenopathy.     Left side of head: No tonsillar adenopathy.     Cervical: No cervical adenopathy.  Skin:    Findings: No erythema or rash.     Nails: There is no clubbing.  Neurological:     Mental Status: He is alert.     Diagnostics: none  Assessment and Plan:   1. Perennial allergic rhinitis   2. Chronic nasal congestion   3. Gastroesophageal reflux disease, unspecified whether  esophagitis present     1.  Utilize the following medications every night:   A.  Nasacort-2 sprays each nostril  2. Continue to Treat and prevent reflux:   A. Famotidine 40mg  tablet daily  3. If needed:   A. Nasal saline  B. OTC antihistamine  4.  Obtain fall flu vaccine and Covid booster  5. Return to clinic in 12 months or earlier if problem  Overall Summer appears to be doing quite well and I am going to have him continue to utilize Nasacort and famotidine to treat his upper airway inflammation and reflux induced respiratory disease and assume that he will do well with this plan and see him back in his clinic in 1 year.  His cough appears to be improving with each passing month from his bypass surgery.  He probably had some degree of pleural inflammation and possibly some lung parenchymal irritation and at this point as long as it continues to improve with each passing week there is no need for any further evaluation and treatment regarding his cough.  Allena Katz, MD Allergy / Immunology [place]

## 2019-12-15 NOTE — Patient Instructions (Signed)
  1.  Utilize the following medications every night:   A.  Nasacort-2 sprays each nostril  2. Continue to Treat and prevent reflux:   A. Famotidine 40mg  tablet daily  3. If needed:   A. Nasal saline  B. OTC antihistamine  4.  Obtain fall flu vaccine and Covid booster  5. Return to clinic in 12 months or earlier if problem

## 2019-12-19 ENCOUNTER — Encounter: Payer: Self-pay | Admitting: Allergy and Immunology

## 2019-12-25 ENCOUNTER — Other Ambulatory Visit: Payer: Self-pay | Admitting: Physician Assistant

## 2020-01-09 ENCOUNTER — Telehealth: Payer: Self-pay | Admitting: Cardiology

## 2020-01-09 NOTE — Telephone Encounter (Signed)
Spoke to Dr. Harriet Masson she will check with hospitalist on this.

## 2020-01-09 NOTE — Telephone Encounter (Signed)
New Message:   Wife called and said pt was admitted on 01-01-20 to [facility]. She would like for Dr Bettina Gavia or one of his associates to monitor his heart care wjile he is there please. Please let her know if this possible.

## 2020-01-09 NOTE — Telephone Encounter (Signed)
Arthor Captain. Minna send a copy of this note to Dr. Geraldo Pitter could you alert him that the patient is in the hospital and the family is requesting consultation.

## 2020-01-09 NOTE — Telephone Encounter (Signed)
Attempting to reach Dr. Harriet Masson. Will continue efforts.

## 2020-01-09 NOTE — Telephone Encounter (Signed)
ManualPlease call Dr. Harriet Masson and let her know about and the family's request ASAP as she is rounding.

## 2020-01-16 ENCOUNTER — Telehealth: Payer: Self-pay | Admitting: Cardiology

## 2020-01-16 MED ORDER — METOPROLOL TARTRATE 25 MG PO TABS: 25.0000 mg | ORAL_TABLET | Freq: Two times a day (BID) | ORAL | 1 refills | Status: DC

## 2020-01-16 NOTE — Telephone Encounter (Signed)
He was hospitalized with COVID-19 I would wait at least 1 more week after October 1 and if I am unavailable but per Dr. Harriet Masson

## 2020-01-16 NOTE — Telephone Encounter (Signed)
Called and got patient scheduled with Dr. Bettina Gavia on October 4 th, 2021

## 2020-01-16 NOTE — Telephone Encounter (Signed)
Pt was in [facility] 01/01/20 with COVID and now has been D/C'd with audible shortness of breath.. he was not told there are any concerns about his cardiac status but advised him to see Dr. Bettina Gavia in the next two weeks.   I cannot find any availability until the first week of Nov 2021 at either location... will forward to the [facility] for assistance.

## 2020-01-16 NOTE — Telephone Encounter (Signed)
New message:    Patient calling to follow up with the doctor. However patient has been in the hospital since 12/02/19 and patient came home 01/14/20. Please call patient

## 2020-01-27 DIAGNOSIS — I499 Cardiac arrhythmia, unspecified: Secondary | ICD-10-CM | POA: Insufficient documentation

## 2020-01-27 DIAGNOSIS — C801 Malignant (primary) neoplasm, unspecified: Secondary | ICD-10-CM | POA: Insufficient documentation

## 2020-01-27 DIAGNOSIS — R0600 Dyspnea, unspecified: Secondary | ICD-10-CM | POA: Insufficient documentation

## 2020-01-27 DIAGNOSIS — I4891 Unspecified atrial fibrillation: Secondary | ICD-10-CM | POA: Insufficient documentation

## 2020-01-27 DIAGNOSIS — I2510 Atherosclerotic heart disease of native coronary artery without angina pectoris: Secondary | ICD-10-CM | POA: Insufficient documentation

## 2020-01-30 ENCOUNTER — Encounter: Payer: Self-pay | Admitting: Cardiology

## 2020-01-30 ENCOUNTER — Other Ambulatory Visit: Payer: Self-pay

## 2020-01-30 ENCOUNTER — Ambulatory Visit (INDEPENDENT_AMBULATORY_CARE_PROVIDER_SITE_OTHER): Payer: Medicare Other | Admitting: Cardiology

## 2020-01-30 VITALS — BP 100/64 | HR 92 | Ht 70.0 in | Wt 193.4 lb

## 2020-01-30 DIAGNOSIS — E785 Hyperlipidemia, unspecified: Secondary | ICD-10-CM | POA: Diagnosis not present

## 2020-01-30 DIAGNOSIS — I25118 Atherosclerotic heart disease of native coronary artery with other forms of angina pectoris: Secondary | ICD-10-CM

## 2020-01-30 DIAGNOSIS — I480 Paroxysmal atrial fibrillation: Secondary | ICD-10-CM

## 2020-01-30 DIAGNOSIS — U071 COVID-19: Secondary | ICD-10-CM

## 2020-01-30 DIAGNOSIS — Z79899 Other long term (current) drug therapy: Secondary | ICD-10-CM | POA: Diagnosis not present

## 2020-01-30 HISTORY — DX: COVID-19: U07.1

## 2020-01-30 MED ORDER — AMIODARONE HCL 200 MG PO TABS: 200.0000 mg | ORAL_TABLET | Freq: Every day | ORAL | 3 refills | Status: DC

## 2020-01-30 MED ORDER — APIXABAN 5 MG PO TABS: 5.0000 mg | ORAL_TABLET | Freq: Two times a day (BID) | ORAL | 3 refills | Status: DC

## 2020-01-30 NOTE — Patient Instructions (Addendum)
Medication Instructions:  Your physician has recommended you make the following change in your medication:  STOP: Aspirin START: Eliquis 5 mg take one tablet by mouth twice daily.  INCREASE: Amiodarone 200 mg take one tablet by mouth twice daily.  *If you need a refill on your cardiac medications before your next appointment, please call your pharmacy*   Lab Work: None If you have labs (blood work) drawn today and your tests are completely normal, you will receive your results only by: Marland Kitchen MyChart Message (if you have MyChart) OR . A paper copy in the mail If you have any lab test that is abnormal or we need to change your treatment, we will call you to review the results.   Testing/Procedures: None   Follow-Up: At [facility], you and your health needs are our priority.  As part of our continuing mission to provide you with exceptional heart care, we have created designated Provider Care Teams.  These Care Teams include your primary Cardiologist (physician) and Advanced Practice Providers (APPs -  Physician Assistants and Nurse Practitioners) who all work together to provide you with the care you need, when you need it.  We recommend signing up for the patient portal called "MyChart".  Sign up information is provided on this After Visit Summary.  MyChart is used to connect with patients for Virtual Visits (Telemedicine).  Patients are able to view lab/test results, encounter notes, upcoming appointments, etc.  Non-urgent messages can be sent to your provider as well.   To learn more about what you can do with MyChart, go to [email or web address].    Your next appointment:   6 week(s)  The format for your next appointment:   In Person  Provider:   Shirlee More, MD   Other Instructions

## 2020-01-30 NOTE — Progress Notes (Signed)
Cardiology Office Note:    Date:  01/30/2020   ID:  Steve Frye, DOB [birth year], MRN [id number]  PCP:  Street, Sharon Mt, MD  Cardiologist:  Shirlee More, MD    Referring MD: [address] Sharon Mt, *    ASSESSMENT:    1. Paroxysmal atrial fibrillation (HCC)   2. On amiodarone therapy   3. Coronary artery disease of native artery of native heart with stable angina pectoris ([place])   4. Hyperlipidemia, unspecified hyperlipidemia type   5. COVID-19 virus infection    PLAN:    In order of problems listed above:  1. Unfortunately back in atrial fibrillation, resume anticoagulation stop aspirin increase amiodarone continue to monitor at home with his Jodelle Red and reassess in the office in 6 weeks 2. Other problems D hyperlipidemia stable continue current treatment 3. Recovering from recent COVID-19 pneumonia he received Regeneron antibody treatment   Next appointment: 6 weeks   Medication Adjustments/Labs and Tests Ordered: Current medicines are reviewed at length with the patient today.  Concerns regarding medicines are outlined above.  Orders Placed This Encounter  Procedures  . EKG 12-Lead   Meds ordered this encounter  Medications  . apixaban (ELIQUIS) 5 MG TABS tablet    Sig: Take 1 tablet (5 mg total) by mouth 2 (two) times daily.    Dispense:  60 tablet    Refill:  3  . amiodarone (PACERONE) 200 MG tablet    Sig: Take 1 tablet (200 mg total) by mouth daily.    Dispense:  90 tablet    Refill:  3    Chief Complaint  Patient presents with  . Follow-up    Recent hospitalization COVID-19 pneumonia.    History of Present Illness:    Steve Frye is a 74 y.o. male with a hx of CAD CABG 10/04/2019 paroxysmal atrial fibrillation treated with IV and subsequent oral amiodarone  last seen 12/01/2019 and subsequently admitted to [facility] with COVID-19 pneumonia. Compliance with diet, lifestyle and medications: Yes he is slowly and steadily improving  On 2 L nasal  oxygen sats are running greater than 90 when he removes it at home.  Not short of breath no chest pain palpitations syncope.  He records his heart rhythm at home with the iPhone adapter and 3 strips this morning show him in atrial fibrillation rates up to 140 is confirmed on today's EKG.  He will stop aspirin go back on Eliquis and increase amiodarone.  I will see back in the office in 6 weeks if he is improved from COVID-19 pneumonia and still in A. fib cardioversion be appropriate.  He is still somewhat debilitated he is lost 20 pounds no shortness of breath nausea or vomiting.  Last x-ray [facility] 01/07/2020 showed bilateral multifocal pulmonary infiltrates increased in severity.  EKG 01/02/2020 sinus rhythm normal independently reviewed by me.  Prior to discharge 01/14/2020 hemoglobin 12.3 white count 11,700 potassium 4.7 creatinine 0.7 he was hyponatremic with a serum sodium of 125 with normal potassium and creatinine.. Past Medical History:  Diagnosis Date  . Acute coronary syndrome ([place]) 09/28/2019  . Arthritis 01/15/2017  . Atrial fibrillation ([place])   . Cancer ([place])    skin cancer  . Chest mass 12/04/2015  . Coronary artery disease   . Coronary artery disease of native artery of native heart with stable angina pectoris ([place])   . Cutaneous abscess of chest wall 12/04/2015  . Dyspnea   . Dysrhythmia    afib  . Epidermoid cyst  of skin 12/04/2015  . GERD (gastroesophageal reflux disease) 01/15/2017  . High risk medication use 01/10/2015   Overview:  Overview:  flecanide  . Hyperlipidemia 01/15/2017  . LAE (left atrial enlargement) 01/15/2017  . Open wound of chest wall 04/01/2016  . PAF (paroxysmal atrial fibrillation) ([place]) 01/10/2015   Overview:  CHADS2 vasc score=1  . Postop check 11/03/2019  . S/P CABG x 3 10/04/2019    Past Surgical History:  Procedure Laterality Date  . APPENDECTOMY    . CARDIAC CATHETERIZATION     09/23/19  . CHOLECYSTECTOMY    . CORONARY ARTERY BYPASS GRAFT N/A  10/04/2019   Procedure: CORONARY ARTERY BYPASS GRAFTING (CABG) times three using right greater saphenous vein and left internal mammary ;  Surgeon: Ivin Poot, MD;  Location: [place];  Service: Open Heart Surgery;  Laterality: N/A;  . CYST EXCISION    . ENDOVEIN HARVEST OF GREATER SAPHENOUS VEIN Right 10/04/2019   Procedure: [name] Of Greater Saphenous Vein;  Surgeon: Ivin Poot, MD;  Location: [place];  Service: Open Heart Surgery;  Laterality: Right;  . EXCISION OF KELOID N/A 10/04/2019   Procedure: Excision Of Sternal Keloid;  Surgeon: Ivin Poot, MD;  Location: [place];  Service: Open Heart Surgery;  Laterality: N/A;  . LAPAROSCOPIC LYSIS INTESTINAL ADHESIONS  2012  . LEFT HEART CATH AND CORONARY ANGIOGRAPHY N/A 09/23/2019   Procedure: LEFT HEART CATH AND CORONARY ANGIOGRAPHY;  Surgeon: Belva Crome, MD;  Location: [place] CV LAB;  Service: Cardiovascular;  Laterality: N/A;  . TEE WITHOUT CARDIOVERSION N/A 10/04/2019   Procedure: TRANSESOPHAGEAL ECHOCARDIOGRAM (TEE);  Surgeon: Prescott Gum, Collier Salina, MD;  Location: [place];  Service: Open Heart Surgery;  Laterality: N/A;    Current Medications: Current Meds  Medication Sig  . albuterol (PROAIR HFA) 108 (90 Base) MCG/ACT inhaler Inhale 2 puffs into the lungs every 4 (four) hours as needed for wheezing or shortness of breath.  . Ascorbic Acid (VITAMIN C) 1000 MG tablet Take 1,000 mg by mouth daily.  . benzonatate (TESSALON) 200 MG capsule Take 200 mg by mouth 3 (three) times daily as needed.  . famotidine (PEPCID) 40 MG tablet TAKE 1 TABLET BY MOUTH EVERY DAY  . finasteride (PROSCAR) 5 MG tablet Take 5 mg daily by mouth.  . metoprolol tartrate (LOPRESSOR) 25 MG tablet Take 1 tablet (25 mg total) by mouth 2 (two) times daily.  . nitroGLYCERIN (NITROSTAT) 0.4 MG SL tablet Take 0.4 mg by mouth.  . Nutritional Supplements (FRUIT & VEGETABLE DAILY PO) Take 2 tablets daily by mouth.  . Probiotic Product (ALIGN PO) Take 1 tablet by  mouth daily.  . simvastatin (ZOCOR) 40 MG tablet TAKE 1 TABLET BY MOUTH EVERY DAY  . triamcinolone ointment (KENALOG) 0.1 % Apply 1 application topically 3 (three) times daily as needed (Dry skin).   Marland Kitchen VITAMIN D PO Take 2,000 mg by mouth daily.  . Zinc 50 MG TABS Take 50 mg by mouth daily.  . [DISCONTINUED] amiodarone (PACERONE) 200 MG tablet Take 1 tablet (200 mg total) by mouth daily.  . [DISCONTINUED] aspirin EC 81 MG tablet Take 81 mg daily by mouth.     Allergies:   Penicillins   Social History   Socioeconomic History  . Marital status: Married    Spouse name: Not on file  . Number of children: Not on file  . Years of education: Not on file  . Highest education level: Not on file  Occupational History  .  Not on file  Tobacco Use  . Smoking status: Former Smoker    Years: 20.00    Types: Cigarettes    Quit date: 01/10/1979    Years since quitting: 41.0  . Smokeless tobacco: Never Used  Vaping Use  . Vaping Use: Never used  Substance and Sexual Activity  . Alcohol use: No  . Drug use: No  . Sexual activity: Not on file  Other Topics Concern  . Not on file  Social History Narrative  . Not on file   Social Determinants of Health   Financial Resource Strain:   . Difficulty of Paying Living Expenses: Not on file  Food Insecurity:   . Worried About Charity fundraiser in the Last Year: Not on file  . Ran Out of Food in the Last Year: Not on file  Transportation Needs:   . Lack of Transportation (Medical): Not on file  . Lack of Transportation (Non-Medical): Not on file  Physical Activity:   . Days of Exercise per Week: Not on file  . Minutes of Exercise per Session: Not on file  Stress:   . Feeling of Stress : Not on file  Social Connections:   . Frequency of Communication with Friends and Family: Not on file  . Frequency of Social Gatherings with Friends and Family: Not on file  . Attends Religious Services: Not on file  . Active Member of Clubs or  Organizations: Not on file  . Attends Archivist Meetings: Not on file  . Marital Status: Not on file     Family History: The patient's family history includes Heart disease in his father; Heart failure in his father. ROS:   Please see the history of present illness.    All other systems reviewed and are negative.  EKGs/Labs/Other Studies Reviewed:    The following studies were reviewed today:  EKG:  EKG ordered today and personally reviewed.  The ekg ordered today demonstrates atrial fibrillation controlled rate 92 bpm  Recent Labs: 10/05/2019: Magnesium 1.8 10/09/2019: Hemoglobin 11.2; Platelets 289 11/09/2019: NT-Pro BNP 540 12/01/2019: ALT 69; BUN 11; Creatinine, Ser 0.79; Potassium 4.3; Sodium 139; TSH 0.576  Recent Lipid Panel    Component Value Date/Time   CHOL 135 12/01/2019 1433   TRIG 114 12/01/2019 1433   HDL 42 12/01/2019 1433   CHOLHDL 3.2 12/01/2019 1433   LDLCALC 72 12/01/2019 1433    Physical Exam:    VS:  BP 100/64   Pulse 92   Ht 5\' 10"  (1.778 m)   Wt 193 lb 6.4 oz (87.7 kg)   SpO2 98%   BMI 27.75 kg/m     Wt Readings from Last 3 Encounters:  01/30/20 193 lb 6.4 oz (87.7 kg)  12/15/19 199 lb (90.3 kg)  12/01/19 202 lb 9.6 oz (91.9 kg)     GEN: He looks debilitated and weak.  He has lower excoriation over the lower sternum there is no indication of wound mention well nourished, well developed in no acute distress HEENT: Normal NECK: No JVD; No carotid bruits LYMPHATICS: No lymphadenopathy CARDIAC:  S1 variable irregular, no murmurs, rubs, gallops RESPIRATORY:  Clear to auscultation without rales, wheezing or rhonchi  ABDOMEN: Soft, non-tender, non-distended MUSCULOSKELETAL:  No edema; No deformity  SKIN: Warm and dry NEUROLOGIC:  Alert and oriented x 3 PSYCHIATRIC:  Normal affect    Signed, Shirlee More, MD  01/30/2020 10:38 AM    [place]

## 2020-01-31 ENCOUNTER — Other Ambulatory Visit: Payer: Self-pay | Admitting: Cardiothoracic Surgery

## 2020-01-31 DIAGNOSIS — Z951 Presence of aortocoronary bypass graft: Secondary | ICD-10-CM

## 2020-02-01 ENCOUNTER — Encounter: Payer: Self-pay | Admitting: Cardiothoracic Surgery

## 2020-02-01 ENCOUNTER — Ambulatory Visit
Admission: RE | Admit: 2020-02-01 | Discharge: 2020-02-01 | Disposition: A | Discharge status: Home / Self Care | Payer: Medicare Other | Source: Ambulatory Visit | Attending: Cardiothoracic Surgery | Admitting: Cardiothoracic Surgery

## 2020-02-01 ENCOUNTER — Other Ambulatory Visit: Payer: Self-pay

## 2020-02-01 ENCOUNTER — Ambulatory Visit (INDEPENDENT_AMBULATORY_CARE_PROVIDER_SITE_OTHER): Payer: Medicare Other | Admitting: Cardiothoracic Surgery

## 2020-02-01 VITALS — BP 127/67 | HR 77 | Temp 97.9°F | Resp 20 | Ht 70.0 in | Wt 191.6 lb

## 2020-02-01 DIAGNOSIS — Z4889 Encounter for other specified surgical aftercare: Secondary | ICD-10-CM

## 2020-02-01 DIAGNOSIS — J90 Pleural effusion, not elsewhere classified: Secondary | ICD-10-CM

## 2020-02-01 DIAGNOSIS — Z951 Presence of aortocoronary bypass graft: Secondary | ICD-10-CM

## 2020-02-01 HISTORY — DX: Encounter for other specified surgical aftercare: Z48.89

## 2020-02-01 NOTE — Progress Notes (Signed)
PCP is Street, Sharon Mt, MD Referring Provider is Street, Sharon Mt, *  Chief Complaint  Patient presents with  . Follow-up    left pleural effusion, CXR 02/01/20    HPI: Final postop visit 4 months after CABG for unstable angina.  Patient is now recovering from Covid 19 pneumonia required 2 months postop.  He still on oxygen 1 L/min but shortness of breath is improving.  His oxygen saturation is 98%.  Chest x-ray today shows resolving viral pneumonitis with some changes still at the right base.  He has no significant left pleural effusion.  Sternal wires are intact.  He denies chest pain or edema.  His main complaint is dry cough.  No fever.  Weight stable.  Past Medical History:  Diagnosis Date  . Acute coronary syndrome ([place]) 09/28/2019  . Arthritis 01/15/2017  . Atrial fibrillation ([place])   . Cancer ([name])    skin cancer  . Chest mass 12/04/2015  . Coronary artery disease   . Coronary artery disease of native artery of native heart with stable angina pectoris ([place])   . Cutaneous abscess of chest wall 12/04/2015  . Dyspnea   . Dysrhythmia    afib  . Epidermoid cyst of skin 12/04/2015  . GERD (gastroesophageal reflux disease) 01/15/2017  . High risk medication use 01/10/2015   Overview:  Overview:  flecanide  . Hyperlipidemia 01/15/2017  . LAE (left atrial enlargement) 01/15/2017  . Open wound of chest wall 04/01/2016  . PAF (paroxysmal atrial fibrillation) ([place]) 01/10/2015   Overview:  CHADS2 vasc score=1  . Postop check 11/03/2019  . S/P CABG x 3 10/04/2019    Past Surgical History:  Procedure Laterality Date  . APPENDECTOMY    . CARDIAC CATHETERIZATION     09/23/19  . CHOLECYSTECTOMY    . CORONARY ARTERY BYPASS GRAFT N/A 10/04/2019   Procedure: CORONARY ARTERY BYPASS GRAFTING (CABG) times three using right greater saphenous vein and left internal mammary ;  Surgeon: Ivin Poot, MD;  Location: [place];  Service: Open Heart Surgery;  Laterality: N/A;  . CYST EXCISION    .  ENDOVEIN HARVEST OF GREATER SAPHENOUS VEIN Right 10/04/2019   Procedure: [name] Of Greater Saphenous Vein;  Surgeon: Ivin Poot, MD;  Location: [place];  Service: Open Heart Surgery;  Laterality: Right;  . EXCISION OF KELOID N/A 10/04/2019   Procedure: Excision Of Sternal Keloid;  Surgeon: Ivin Poot, MD;  Location: [place];  Service: Open Heart Surgery;  Laterality: N/A;  . LAPAROSCOPIC LYSIS INTESTINAL ADHESIONS  2012  . LEFT HEART CATH AND CORONARY ANGIOGRAPHY N/A 09/23/2019   Procedure: LEFT HEART CATH AND CORONARY ANGIOGRAPHY;  Surgeon: Belva Crome, MD;  Location: [place] [facility];  Service: Cardiovascular;  Laterality: N/A;  . TEE WITHOUT CARDIOVERSION N/A 10/04/2019   Procedure: TRANSESOPHAGEAL ECHOCARDIOGRAM (TEE);  Surgeon: Prescott Gum, Collier Salina, MD;  Location: [place];  Service: Open Heart Surgery;  Laterality: N/A;    Family History  Problem Relation Age of Onset  . Heart disease Father   . Heart failure Father     Social History Social History   Tobacco Use  . Smoking status: Former Smoker    Years: 20.00    Types: Cigarettes    Quit date: 01/10/1979    Years since quitting: 41.0  . Smokeless tobacco: Never Used  Vaping Use  . Vaping Use: Never used  Substance Use Topics  . Alcohol use: No  . Drug use: No  Current Outpatient Medications  Medication Sig Dispense Refill  . albuterol (PROAIR HFA) 108 (90 Base) MCG/ACT inhaler Inhale 2 puffs into the lungs every 4 (four) hours as needed for wheezing or shortness of breath.    Marland Kitchen amiodarone (PACERONE) 200 MG tablet Take 1 tablet (200 mg total) by mouth daily. (Patient taking differently: Take 200 mg by mouth 2 (two) times daily. ) 90 tablet 3  . apixaban (ELIQUIS) 5 MG TABS tablet Take 1 tablet (5 mg total) by mouth 2 (two) times daily. 60 tablet 3  . Ascorbic Acid (VITAMIN C) 1000 MG tablet Take 1,000 mg by mouth daily.    . benzonatate (TESSALON) 200 MG capsule Take 200 mg by mouth 3 (three) times daily as  needed.    . famotidine (PEPCID) 40 MG tablet TAKE 1 TABLET BY MOUTH EVERY DAY 90 tablet 3  . finasteride (PROSCAR) 5 MG tablet Take 5 mg daily by mouth.  2  . metoprolol tartrate (LOPRESSOR) 25 MG tablet Take 1 tablet (25 mg total) by mouth 2 (two) times daily. 180 tablet 1  . nitroGLYCERIN (NITROSTAT) 0.4 MG SL tablet Take 0.4 mg by mouth.    . Nutritional Supplements (FRUIT & VEGETABLE DAILY PO) Take 2 tablets daily by mouth.    . Probiotic Product (ALIGN PO) Take 1 tablet by mouth daily.    . simvastatin (ZOCOR) 40 MG tablet TAKE 1 TABLET BY MOUTH EVERY DAY 90 tablet 0  . triamcinolone (NASACORT ALLERGY 24HR) 55 MCG/ACT AERO nasal inhaler Using two sprays in each nostril every night as directed.     . triamcinolone ointment (KENALOG) 0.1 % Apply 1 application topically 3 (three) times daily as needed (Dry skin).     Marland Kitchen VITAMIN D PO Take 2,000 mg by mouth daily.    . Zinc 50 MG TABS Take 50 mg by mouth daily.     No current facility-administered medications for this visit.    Allergies  Allergen Reactions  . Penicillins Hives    In his 20's    Review of Systems Recovering from COVID-19 pneumonia 2 months after CABG still on oxygen but weaning down to 1.5 L. Recurrent atrial fibrillation following COVID-19 pneumonitis now in sinus rhythm on amiodarone   BP 127/67 (BP Location: Left Arm, Patient Position: Sitting)   Pulse 77   Temp 97.9 F (36.6 C)   Resp 20   Ht 5\' 10"  (1.778 m)   Wt 191 lb 9.6 oz (86.9 kg)   SpO2 98% Comment: 1.5L o2 via nasal cannula  BMI 27.49 kg/m  Physical Exam      Exam    General- alert and comfortable    Neck- no JVD, no cervical adenopathy palpable, no carotid bruit   Lungs- clear without rales, wheezes   Cor- regular rate and rhythm, no murmur , gallop   Abdomen- soft, non-tender   Extremities - warm, non-tender, minimal edema   Neuro- oriented, appropriate, no focal weakness   Diagnostic Tests: Chest x-ray images personally reviewed  showing no significant left pleural effusion, resolving viral pneumonitis right base  Impression: Cardiac status improved after CABG.  No sternal restrictions at this point.  Patient is encouraged to follow heart healthy lifestyle including heart healthy diet, 40 minutes of aerobic exercise daily, and compliance with medications including Zocor and aspirin once he stops his Eliquis  Plan: Return as needed  Len Childs, MD [facility] [phone]

## 2020-03-06 ENCOUNTER — Ambulatory Visit (INDEPENDENT_AMBULATORY_CARE_PROVIDER_SITE_OTHER): Payer: Medicare Other | Admitting: Cardiology

## 2020-03-06 ENCOUNTER — Other Ambulatory Visit: Payer: Self-pay

## 2020-03-06 ENCOUNTER — Encounter: Payer: Self-pay | Admitting: Cardiology

## 2020-03-06 VITALS — BP 129/67 | HR 59 | Ht 69.0 in | Wt 199.8 lb

## 2020-03-06 DIAGNOSIS — I25118 Atherosclerotic heart disease of native coronary artery with other forms of angina pectoris: Secondary | ICD-10-CM | POA: Diagnosis not present

## 2020-03-06 DIAGNOSIS — I480 Paroxysmal atrial fibrillation: Secondary | ICD-10-CM | POA: Diagnosis not present

## 2020-03-06 DIAGNOSIS — Z23 Encounter for immunization: Secondary | ICD-10-CM

## 2020-03-06 DIAGNOSIS — Z7901 Long term (current) use of anticoagulants: Secondary | ICD-10-CM | POA: Diagnosis not present

## 2020-03-06 DIAGNOSIS — Z79899 Other long term (current) drug therapy: Secondary | ICD-10-CM

## 2020-03-06 MED ORDER — AMIODARONE HCL 200 MG PO TABS: 300.0000 mg | ORAL_TABLET | Freq: Every day | ORAL | 3 refills | Status: DC

## 2020-03-06 NOTE — Patient Instructions (Signed)
Medication Instructions:  Your physician has recommended you make the following change in your medication:  DECREASE: Amiodarone 300 mg take 1.5 tablet by mouth daily.  *If you need a refill on your cardiac medications before your next appointment, please call your pharmacy*   Lab Work: None If you have labs (blood work) drawn today and your tests are completely normal, you will receive your results only by: Marland Kitchen MyChart Message (if you have MyChart) OR . A paper copy in the mail If you have any lab test that is abnormal or we need to change your treatment, we will call you to review the results.   Testing/Procedures: None   Follow-Up: At [facility], you and your health needs are our priority.  As part of our continuing mission to provide you with exceptional heart care, we have created designated Provider Care Teams.  These Care Teams include your primary Cardiologist (physician) and Advanced Practice Providers (APPs -  Physician Assistants and Nurse Practitioners) who all work together to provide you with the care you need, when you need it.  We recommend signing up for the patient portal called "MyChart".  Sign up information is provided on this After Visit Summary.  MyChart is used to connect with patients for Virtual Visits (Telemedicine).  Patients are able to view lab/test results, encounter notes, upcoming appointments, etc.  Non-urgent messages can be sent to your provider as well.   To learn more about what you can do with MyChart, go to [email or web address].    Your next appointment:   6 week(s)  The format for your next appointment:   In Person  Provider:   Shirlee More, MD   Other Instructions

## 2020-03-06 NOTE — Progress Notes (Signed)
Cardiology Office Note:    Date:  03/06/2020   ID:  Steve Frye, DOB [birth year], MRN [id number]  PCP:  Street, Sharon Mt, MD  Cardiologist:  Shirlee More, MD    Referring MD: [address], Sharon Mt, *    ASSESSMENT:    1. Paroxysmal atrial fibrillation (HCC)   2. On amiodarone therapy   3. Chronic anticoagulation   4. Coronary artery disease of native artery of native heart with stable angina pectoris ([place])    PLAN:    In order of problems listed above:  1. Fortunately he resume sinus rhythm he reduce amiodarone 300 mg/day continue anticoagulation reassess in the office in 6 weeks. 2. Stable CAD finally recovering after being set back by COVID-19 having no angina on current medical therapy continue treatment including lipid-lowering and return to cardiac rehabilitation   Next appointment: 6 weeks he will need a follow-up chest x-ray or CT at that time   Medication Adjustments/Labs and Tests Ordered: Current medicines are reviewed at length with the patient today.  Concerns regarding medicines are outlined above.  Orders Placed This Encounter  Procedures   EKG 12-Lead   Meds ordered this encounter  Medications   amiodarone (PACERONE) 200 MG tablet    Sig: Take 1.5 tablets (300 mg total) by mouth daily.    Dispense:  90 tablet    Refill:  3    Chief Complaint  Patient presents with   Follow-up   Atrial Fibrillation    History of Present Illness:    Steve Frye is a 74 y.o. male with a hx of CAD with CABG 10/04/2019, paroxysmal atrial fibrillation treated with IV and subsequent oral amiodarone and COVID-19 pneumonia.  He was last seen 01/30/2020 with persistent atrial fibrillation and initiated on Eliquis.  Compliance with diet, lifestyle and medications: Yes  Last x-ray [facility] 01/07/2020 showed bilateral multifocal pulmonary infiltrates increased in severity.  EKG 01/02/2020 sinus rhythm normal independently reviewed by me.  Prior to discharge  01/14/2020 hemoglobin 12.3 white count 11,700 potassium 4.7 creatinine 0.7 he was hyponatremic with a serum sodium of 125 with normal potassium and creatinine.  Follow-up x-ray [facility] 02/01/2020 showed interval clearing with resolving viral pneumonia.  Within 2 days of increasing amiodarone he converted to sinus rhythm feels much better does not wear oxygen during the day wants to start cardiac rehabilitation.  No edema chest pain cough and not short of breath during usual activities.  He tolerates his anticoagulant without bleeding. Past Medical History:  Diagnosis Date   Acute coronary syndrome ([place]) 09/28/2019   Arthritis 01/15/2017   Atrial fibrillation (HCC)    Cancer (HCC)    skin cancer   Chest mass 12/04/2015   Coronary artery disease    Coronary artery disease of native artery of native heart with stable angina pectoris ([place])    Cutaneous abscess of chest wall 12/04/2015   Dyspnea    Dysrhythmia    afib   Epidermoid cyst of skin 12/04/2015   GERD (gastroesophageal reflux disease) 01/15/2017   High risk medication use 01/10/2015   Overview:  Overview:  flecanide   Hyperlipidemia 01/15/2017   LAE (left atrial enlargement) 01/15/2017   Open wound of chest wall 04/01/2016   PAF (paroxysmal atrial fibrillation) ([place]) 01/10/2015   Overview:  CHADS2 vasc score=1   Postop check 11/03/2019   S/P CABG x 3 10/04/2019    Past Surgical History:  Procedure Laterality Date   APPENDECTOMY     CARDIAC CATHETERIZATION  09/23/19   CHOLECYSTECTOMY     CORONARY ARTERY BYPASS GRAFT N/A 10/04/2019   Procedure: CORONARY ARTERY BYPASS GRAFTING (CABG) times three using right greater saphenous vein and left internal mammary ;  Surgeon: Ivin Poot, MD;  Location: [place];  Service: Open Heart Surgery;  Laterality: N/A;   CYST EXCISION     ENDOVEIN HARVEST OF GREATER SAPHENOUS VEIN Right 10/04/2019   Procedure: [name] Ropes Of Greater Saphenous Vein;  Surgeon: Ivin Poot, MD;  Location: [place];  Service: Open Heart Surgery;  Laterality: Right;   EXCISION OF KELOID N/A 10/04/2019   Procedure: Excision Of Sternal Keloid;  Surgeon: Ivin Poot, MD;  Location: [place];  Service: Open Heart Surgery;  Laterality: N/A;   LAPAROSCOPIC LYSIS INTESTINAL ADHESIONS  2012   LEFT HEART CATH AND CORONARY ANGIOGRAPHY N/A 09/23/2019   Procedure: LEFT HEART CATH AND CORONARY ANGIOGRAPHY;  Surgeon: Belva Crome, MD;  Location: [place] CV LAB;  Service: Cardiovascular;  Laterality: N/A;   TEE WITHOUT CARDIOVERSION N/A 10/04/2019   Procedure: TRANSESOPHAGEAL ECHOCARDIOGRAM (TEE);  Surgeon: Prescott Gum, Collier Salina, MD;  Location: [place];  Service: Open Heart Surgery;  Laterality: N/A;    Current Medications: Current Meds  Medication Sig   amiodarone (PACERONE) 200 MG tablet Take 1.5 tablets (300 mg total) by mouth daily.   apixaban (ELIQUIS) 5 MG TABS tablet Take 1 tablet (5 mg total) by mouth 2 (two) times daily.   Ascorbic Acid (VITAMIN C) 1000 MG tablet Take 1,000 mg by mouth daily.   famotidine (PEPCID) 40 MG tablet TAKE 1 TABLET BY MOUTH EVERY DAY   finasteride (PROSCAR) 5 MG tablet Take 5 mg daily by mouth.   metoprolol tartrate (LOPRESSOR) 25 MG tablet Take 1 tablet (25 mg total) by mouth 2 (two) times daily.   nitroGLYCERIN (NITROSTAT) 0.4 MG SL tablet Take 0.4 mg by mouth.   Nutritional Supplements (FRUIT & VEGETABLE DAILY PO) Take 2 tablets daily by mouth.   Probiotic Product (ALIGN PO) Take 1 tablet by mouth daily.   simvastatin (ZOCOR) 40 MG tablet TAKE 1 TABLET BY MOUTH EVERY DAY   triamcinolone (NASACORT ALLERGY 24HR) 55 MCG/ACT AERO nasal inhaler Using two sprays in each nostril every night as directed.    triamcinolone ointment (KENALOG) 0.1 % Apply 1 application topically 3 (three) times daily as needed (Dry skin).    VITAMIN D PO Take 2,000 mg by mouth daily.   Zinc 50 MG TABS Take 50 mg by mouth daily.   [DISCONTINUED] amiodarone  (PACERONE) 200 MG tablet Take 1 tablet (200 mg total) by mouth daily. (Patient taking differently: Take 200 mg by mouth 2 (two) times daily. )     Allergies:   Penicillins   Social History   Socioeconomic History   Marital status: Married    Spouse name: Not on file   Number of children: Not on file   Years of education: Not on file   Highest education level: Not on file  Occupational History   Not on file  Tobacco Use   Smoking status: Former Smoker    Years: 20.00    Types: Cigarettes    Quit date: 01/10/1979    Years since quitting: 41.1   Smokeless tobacco: Never Used  Vaping Use   Vaping Use: Never used  Substance and Sexual Activity   Alcohol use: No   Drug use: No   Sexual activity: Not on file  Other Topics Concern   Not on  file  Social History Narrative   Not on file   Social Determinants of Health   Financial Resource Strain:    Difficulty of Paying Living Expenses: Not on file  Food Insecurity:    Worried About Charity fundraiser in the Last Year: Not on file   [employer] of Food in the Last Year: Not on file  Transportation Needs:    Lack of Transportation (Medical): Not on file   Lack of Transportation (Non-Medical): Not on file  Physical Activity:    Days of Exercise per Week: Not on file   Minutes of Exercise per Session: Not on file  Stress:    Feeling of Stress : Not on file  Social Connections:    Frequency of Communication with Friends and Family: Not on file   Frequency of Social Gatherings with Friends and Family: Not on file   Attends Religious Services: Not on file   Active Member of Clubs or Organizations: Not on file   Attends Archivist Meetings: Not on file   Marital Status: Not on file     Family History: The patient's family history includes Heart disease in his father; Heart failure in his father. ROS:   Please see the history of present illness.    All other systems reviewed and are  negative.  EKGs/Labs/Other Studies Reviewed:    The following studies were reviewed today:  EKG:  EKG ordered today and personally reviewed.  The ekg ordered today demonstrates sinus rhythm normal EKG  Recent Labs: 10/05/2019: Magnesium 1.8 10/09/2019: Hemoglobin 11.2; Platelets 289 11/09/2019: NT-Pro BNP 540 12/01/2019: ALT 69; BUN 11; Creatinine, Ser 0.79; Potassium 4.3; Sodium 139; TSH 0.576  Recent Lipid Panel    Component Value Date/Time   CHOL 135 12/01/2019 1433   TRIG 114 12/01/2019 1433   HDL 42 12/01/2019 1433   CHOLHDL 3.2 12/01/2019 1433   LDLCALC 72 12/01/2019 1433    Physical Exam:    VS:  BP 129/67    Pulse (!) 59    Ht 5\' 9"  (1.753 m)    Wt 199 lb 12.8 oz (90.6 kg)    SpO2 96%    BMI 29.51 kg/m     Wt Readings from Last 3 Encounters:  03/06/20 199 lb 12.8 oz (90.6 kg)  02/01/20 191 lb 9.6 oz (86.9 kg)  01/30/20 193 lb 6.4 oz (87.7 kg)     GEN:  Well nourished, well developed in no acute distress HEENT: Normal NECK: No JVD; No carotid bruits LYMPHATICS: No lymphadenopathy CARDIAC: RRR, no murmurs, rubs, gallops RESPIRATORY:  Clear to auscultation without rales, wheezing or rhonchi  ABDOMEN: Soft, non-tender, non-distended MUSCULOSKELETAL:  No edema; No deformity  SKIN: Warm and dry NEUROLOGIC:  Alert and oriented x 3 PSYCHIATRIC:  Normal affect    Signed, Shirlee More, MD  03/06/2020 2:36 PM    [facility]

## 2020-04-18 ENCOUNTER — Ambulatory Visit (INDEPENDENT_AMBULATORY_CARE_PROVIDER_SITE_OTHER): Payer: Medicare Other | Admitting: Cardiology

## 2020-04-18 ENCOUNTER — Encounter: Payer: Self-pay | Admitting: Cardiology

## 2020-04-18 ENCOUNTER — Other Ambulatory Visit: Payer: Self-pay

## 2020-04-18 VITALS — BP 112/78 | HR 54 | Ht 69.0 in | Wt 210.0 lb

## 2020-04-18 DIAGNOSIS — Z7901 Long term (current) use of anticoagulants: Secondary | ICD-10-CM | POA: Diagnosis not present

## 2020-04-18 DIAGNOSIS — E785 Hyperlipidemia, unspecified: Secondary | ICD-10-CM

## 2020-04-18 DIAGNOSIS — Z79899 Other long term (current) drug therapy: Secondary | ICD-10-CM

## 2020-04-18 DIAGNOSIS — I25118 Atherosclerotic heart disease of native coronary artery with other forms of angina pectoris: Secondary | ICD-10-CM | POA: Diagnosis not present

## 2020-04-18 DIAGNOSIS — I480 Paroxysmal atrial fibrillation: Secondary | ICD-10-CM

## 2020-04-18 MED ORDER — METOPROLOL TARTRATE 25 MG PO TABS
12.5000 mg | ORAL_TABLET | Freq: Two times a day (BID) | ORAL | 1 refills | Status: DC
Start: 2020-04-18 — End: 2020-07-11

## 2020-04-18 NOTE — Patient Instructions (Signed)
Medication Instructions:  Your physician has recommended you make the following change in your medication:   DECREASE: Metoprolol to 12.5 mg twice daily   *If you need a refill on your cardiac medications before your next appointment, please call your pharmacy*   Lab Work: None.  If you have labs (blood work) drawn today and your tests are completely normal, you will receive your results only by: Marland Kitchen MyChart Message (if you have MyChart) OR . A paper copy in the mail If you have any lab test that is abnormal or we need to change your treatment, we will call you to review the results.   Testing/Procedures: None   Follow-Up: At [facility], you and your health needs are our priority.  As part of our continuing mission to provide you with exceptional heart care, we have created designated Provider Care Teams.  These Care Teams include your primary Cardiologist (physician) and Advanced Practice Providers (APPs -  Physician Assistants and Nurse Practitioners) who all work together to provide you with the care you need, when you need it.  We recommend signing up for the patient portal called "MyChart".  Sign up information is provided on this After Visit Summary.  MyChart is used to connect with patients for Virtual Visits (Telemedicine).  Patients are able to view lab/test results, encounter notes, upcoming appointments, etc.  Non-urgent messages can be sent to your provider as well.   To learn more about what you can do with MyChart, go to [email or web address].    Your next appointment:   3 month(s)  The format for your next appointment:   In Person  Provider:   Shirlee More, MD   Other Instructions

## 2020-04-18 NOTE — Progress Notes (Signed)
Cardiology Office Note:    Date:  04/18/2020   ID:  Steve Frye, DOB 13-Feb-1946, MRN [id number]  PCP:  Street, Steve Mt, MD  Cardiologist:  Shirlee More, MD    Referring MD: [address], Steve Frye, *    ASSESSMENT:    1. Paroxysmal atrial fibrillation (HCC)   2. On amiodarone therapy   3. Chronic anticoagulation   4. Coronary artery disease of native artery of native heart with stable angina pectoris ([place])   5. Hyperlipidemia, unspecified hyperlipidemia type    PLAN:    In order of problems listed above:  1. Overall [name] is done well maintaining sinus rhythm and continue the amiodarone dose 300 mg/day his anticoagulant reduce his beta-blocker with home heart rates at times less than 50. 2. Stable CAD he has a little bit of chest wall pain probably related to his previous pulmonary infection he does not require an ischemia evaluation continue his medical therapy including lipid-lowering 3. Stable continue with statin his lipid profile August 2021 shows a cholesterol 135 HDL 42 LDL 73 triglycerides 114   Next appointment: 3 months   Medication Adjustments/Labs and Tests Ordered: Current medicines are reviewed at length with the patient today.  Concerns regarding medicines are outlined above.  No orders of the defined types were placed in this encounter.  No orders of the defined types were placed in this encounter.   Chief Complaint  Patient presents with  . Follow-up    History of Present Illness:    Steve Frye is a 73 y.o. male with a hx of CAD with CABG, paroxysmal atrial fibrillation maintained on amiodarone with sinus rhythm and chronic anticoagulation.  He was last seen 03/06/2020 and amiodarone reduced to 300 mg/day.  Compliance with diet, lifestyle and medications: Yes  He has had some localized left parasternal pain and tenderness on exam will use intermittent ibuprofen over-the-counter No episodes of atrial fibrillation no palpitation or syncope he is  not short of breath and no bleeding from his anticoagulant Past Medical History:  Diagnosis Date  . Acute coronary syndrome ([place]) 09/28/2019  . Arthritis 01/15/2017  . Atrial fibrillation ([place])   . Cancer ([place])    skin cancer  . Chest mass 12/04/2015  . Coronary artery disease   . Coronary artery disease of native artery of native heart with stable angina pectoris ([place])   . Cutaneous abscess of chest wall 12/04/2015  . Dyspnea   . Dysrhythmia    afib  . Epidermoid cyst of skin 12/04/2015  . GERD (gastroesophageal reflux disease) 01/15/2017  . High risk medication use 01/10/2015   Overview:  Overview:  flecanide  . Hyperlipidemia 01/15/2017  . LAE (left atrial enlargement) 01/15/2017  . Open wound of chest wall 04/01/2016  . PAF (paroxysmal atrial fibrillation) ([place]) 01/10/2015   Overview:  CHADS2 vasc score=1  . Postop check 11/03/2019  . S/P CABG x 3 10/04/2019    Past Surgical History:  Procedure Laterality Date  . APPENDECTOMY    . CARDIAC CATHETERIZATION     09/23/19  . CHOLECYSTECTOMY    . CORONARY ARTERY BYPASS GRAFT N/A 10/04/2019   Procedure: CORONARY ARTERY BYPASS GRAFTING (CABG) times three using right greater saphenous vein and left internal mammary ;  Surgeon: Ivin Poot, MD;  Location: [place];  Service: Open Heart Surgery;  Laterality: N/A;  . CYST EXCISION    . ENDOVEIN HARVEST OF GREATER SAPHENOUS VEIN Right 10/04/2019   Procedure: [name] Ropes Of Greater Saphenous Vein;  Surgeon:  Ivin Poot, MD;  Location: [place];  Service: Open Heart Surgery;  Laterality: Right;  . EXCISION OF KELOID N/A 10/04/2019   Procedure: Excision Of Sternal Keloid;  Surgeon: Ivin Poot, MD;  Location: [place];  Service: Open Heart Surgery;  Laterality: N/A;  . LAPAROSCOPIC LYSIS INTESTINAL ADHESIONS  2012  . LEFT HEART CATH AND CORONARY ANGIOGRAPHY N/A 09/23/2019   Procedure: LEFT HEART CATH AND CORONARY ANGIOGRAPHY;  Surgeon: Belva Crome, MD;  Location: [place] CV LAB;  Service:  Cardiovascular;  Laterality: N/A;  . TEE WITHOUT CARDIOVERSION N/A 10/04/2019   Procedure: TRANSESOPHAGEAL ECHOCARDIOGRAM (TEE);  Surgeon: Prescott Gum, Collier Salina, MD;  Location: [place];  Service: Open Heart Surgery;  Laterality: N/A;    Current Medications: Current Meds  Medication Sig  . amiodarone (PACERONE) 200 MG tablet Take 1.5 tablets (300 mg total) by mouth daily.  Marland Kitchen apixaban (ELIQUIS) 5 MG TABS tablet Take 1 tablet (5 mg total) by mouth 2 (two) times daily.  . Ascorbic Acid (VITAMIN C) 1000 MG tablet Take 1,000 mg by mouth daily.  . famotidine (PEPCID) 40 MG tablet TAKE 1 TABLET BY MOUTH EVERY DAY  . finasteride (PROSCAR) 5 MG tablet Take 5 mg daily by mouth.  . metoprolol tartrate (LOPRESSOR) 25 MG tablet Take 1 tablet (25 mg total) by mouth 2 (two) times daily.  . Multiple Vitamins-Minerals (PRESERVISION AREDS 2 PO) Take 1 tablet by mouth daily.  . nitroGLYCERIN (NITROSTAT) 0.4 MG SL tablet Take 0.4 mg by mouth.  . Nutritional Supplements (FRUIT & VEGETABLE DAILY PO) Take 2 tablets daily by mouth.  . Probiotic Product (ALIGN PO) Take 1 tablet by mouth daily.  . simvastatin (ZOCOR) 40 MG tablet TAKE 1 TABLET BY MOUTH EVERY DAY  . triamcinolone (NASACORT) 55 MCG/ACT AERO nasal inhaler Using two sprays in each nostril every night as directed.   . triamcinolone ointment (KENALOG) 0.1 % Apply 1 application topically 3 (three) times daily as needed (Dry skin).   Marland Kitchen VITAMIN D PO Take 2,000 mg by mouth daily.  . Zinc 50 MG TABS Take 50 mg by mouth daily.     Allergies:   Penicillins   Social History   Socioeconomic History  . Marital status: Married    Spouse name: Not on file  . Number of children: Not on file  . Years of education: Not on file  . Highest education level: Not on file  Occupational History  . Not on file  Tobacco Use  . Smoking status: Former Smoker    Years: 20.00    Types: Cigarettes    Quit date: 01/10/1979    Years since quitting: 41.2  . Smokeless tobacco:  Never Used  Vaping Use  . Vaping Use: Never used  Substance and Sexual Activity  . Alcohol use: No  . Drug use: No  . Sexual activity: Not on file  Other Topics Concern  . Not on file  Social History Narrative  . Not on file   Social Determinants of Health   Financial Resource Strain: Not on file  Food Insecurity: Not on file  Transportation Needs: Not on file  Physical Activity: Not on file  Stress: Not on file  Social Connections: Not on file     Family History: The patient's family history includes Heart disease in his father; Heart failure in his father. ROS:   Please see the history of present illness.    All other systems reviewed and are negative.  EKGs/Labs/Other Studies  Reviewed:    The following studies were reviewed today:  EKG:  EKG ordered today and personally reviewed.  The ekg ordered today demonstrates EKG today shows sinus bradycardia 54 bpm  Recent Labs: 10/05/2019: Magnesium 1.8 10/09/2019: Hemoglobin 11.2; Platelets 289 11/09/2019: NT-Pro BNP 540 12/01/2019: ALT 69; BUN 11; Creatinine, Ser 0.79; Potassium 4.3; Sodium 139; TSH 0.576  Recent Lipid Panel    Component Value Date/Time   CHOL 135 12/01/2019 1433   TRIG 114 12/01/2019 1433   HDL 42 12/01/2019 1433   CHOLHDL 3.2 12/01/2019 1433   LDLCALC 72 12/01/2019 1433    Physical Exam:    VS:  BP 112/78 (BP Location: Right Arm, Patient Position: Sitting, Cuff Size: Normal)   Ht 5\' 9"  (1.753 m)   Wt 210 lb (95.3 kg)   BMI 31.01 kg/m     Wt Readings from Last 3 Encounters:  04/18/20 210 lb (95.3 kg)  03/06/20 199 lb 12.8 oz (90.6 kg)  02/01/20 191 lb 9.6 oz (86.9 kg)     GEN:  Well nourished, well developed in no acute distress HEENT: Normal NECK: No JVD; No carotid bruits LYMPHATICS: No lymphadenopathy CARDIAC: RRR, no murmurs, rubs, gallops RESPIRATORY:  Clear to auscultation without rales, wheezing or rhonchi  ABDOMEN: Soft, non-tender, non-distended MUSCULOSKELETAL:  No edema; No  deformity  SKIN: Warm and dry NEUROLOGIC:  Alert and oriented x 3 PSYCHIATRIC:  Normal affect    Signed, Shirlee More, MD  04/18/2020 1:45 PM    [facility]

## 2020-05-08 DIAGNOSIS — E782 Mixed hyperlipidemia: Secondary | ICD-10-CM | POA: Diagnosis not present

## 2020-05-08 DIAGNOSIS — R739 Hyperglycemia, unspecified: Secondary | ICD-10-CM | POA: Diagnosis not present

## 2020-05-08 DIAGNOSIS — Z125 Encounter for screening for malignant neoplasm of prostate: Secondary | ICD-10-CM | POA: Diagnosis not present

## 2020-05-08 DIAGNOSIS — Z79899 Other long term (current) drug therapy: Secondary | ICD-10-CM | POA: Diagnosis not present

## 2020-06-10 ENCOUNTER — Other Ambulatory Visit: Payer: Self-pay | Admitting: Cardiology

## 2020-06-11 NOTE — Telephone Encounter (Signed)
60m, 95.3kg, scr 0.79 12/01/19, lovw/munley 04/18/20

## 2020-06-11 NOTE — Telephone Encounter (Signed)
Prescription refill request for Eliquis received. Indication:atrial fibrillation Last office visit:12/21 munley Scr:0.79 8/21 Age: 75 Weight:95.3 kg  Prescription refilled

## 2020-06-22 DIAGNOSIS — R079 Chest pain, unspecified: Secondary | ICD-10-CM | POA: Diagnosis not present

## 2020-06-22 DIAGNOSIS — Z6827 Body mass index (BMI) 27.0-27.9, adult: Secondary | ICD-10-CM | POA: Diagnosis not present

## 2020-06-22 DIAGNOSIS — J209 Acute bronchitis, unspecified: Secondary | ICD-10-CM | POA: Diagnosis not present

## 2020-06-22 DIAGNOSIS — I25119 Atherosclerotic heart disease of native coronary artery with unspecified angina pectoris: Secondary | ICD-10-CM | POA: Diagnosis not present

## 2020-06-26 DIAGNOSIS — I4891 Unspecified atrial fibrillation: Secondary | ICD-10-CM | POA: Diagnosis not present

## 2020-06-26 DIAGNOSIS — J209 Acute bronchitis, unspecified: Secondary | ICD-10-CM | POA: Diagnosis not present

## 2020-07-11 ENCOUNTER — Other Ambulatory Visit: Payer: Self-pay | Admitting: Cardiology

## 2020-07-11 NOTE — Telephone Encounter (Signed)
Metoprolol approved and sent 

## 2020-07-18 NOTE — Progress Notes (Deleted)
Cardiology Office Note:    Date:  07/18/2020   ID:  Steve Frye, DOB June 11, 1945, MRN [id number]  PCP:  Street, Sharon Mt, MD  Cardiologist:  Shirlee More, MD    Referring MD: [address], [place] Mt, *    ASSESSMENT:    No diagnosis found. PLAN:    In order of problems listed above:  1. ***   Next appointment: ***   Medication Adjustments/Labs and Tests Ordered: Current medicines are reviewed at length with the patient today.  Concerns regarding medicines are outlined above.  No orders of the defined types were placed in this encounter.  No orders of the defined types were placed in this encounter.   No chief complaint on file.   History of Present Illness:    Steve Frye is a 75 y.o. male with a hx of paroxysmal atrial fibrillation maintaining sinus rhythm on amiodarone and anticoagulated, CAD with CABG 10/04/2019 and hyperlipidemia last seen 04/18/2020. Compliance with diet, lifestyle and medications: *** Past Medical History:  Diagnosis Date  . Acute coronary syndrome ([place]) 09/28/2019  . Arthritis 01/15/2017  . Atrial fibrillation ([place])   . Cancer ([place])    skin cancer  . Chest mass 12/04/2015  . Coronary artery disease   . Coronary artery disease of native artery of native heart with stable angina pectoris ([place])   . Cutaneous abscess of chest wall 12/04/2015  . Dyspnea   . Dysrhythmia    afib  . Epidermoid cyst of skin 12/04/2015  . GERD (gastroesophageal reflux disease) 01/15/2017  . High risk medication use 01/10/2015   Overview:  Overview:  flecanide  . Hyperlipidemia 01/15/2017  . LAE (left atrial enlargement) 01/15/2017  . Open wound of chest wall 04/01/2016  . PAF (paroxysmal atrial fibrillation) ([place]) 01/10/2015   Overview:  CHADS2 vasc score=1  . Postop check 11/03/2019  . S/P CABG x 3 10/04/2019    Past Surgical History:  Procedure Laterality Date  . APPENDECTOMY    . CARDIAC CATHETERIZATION     09/23/19  . CHOLECYSTECTOMY    . CORONARY ARTERY BYPASS  GRAFT N/A 10/04/2019   Procedure: CORONARY ARTERY BYPASS GRAFTING (CABG) times three using right greater saphenous vein and left internal mammary ;  Surgeon: Ivin Poot, MD;  Location: [facility];  Service: Open Heart Surgery;  Laterality: N/A;  . CYST EXCISION    . ENDOVEIN HARVEST OF GREATER SAPHENOUS VEIN Right 10/04/2019   Procedure: [name] Of Greater Saphenous Vein;  Surgeon: Ivin Poot, MD;  Location: [facility];  Service: Open Heart Surgery;  Laterality: Right;  . EXCISION OF KELOID N/A 10/04/2019   Procedure: Excision Of Sternal Keloid;  Surgeon: Ivin Poot, MD;  Location: [facility];  Service: Open Heart Surgery;  Laterality: N/A;  . LAPAROSCOPIC LYSIS INTESTINAL ADHESIONS  2012  . LEFT HEART CATH AND CORONARY ANGIOGRAPHY N/A 09/23/2019   Procedure: LEFT HEART CATH AND CORONARY ANGIOGRAPHY;  Surgeon: Belva Crome, MD;  Location: [facility];  Service: Cardiovascular;  Laterality: N/A;  . TEE WITHOUT CARDIOVERSION N/A 10/04/2019   Procedure: TRANSESOPHAGEAL ECHOCARDIOGRAM (TEE);  Surgeon: Prescott Gum, Collier Salina, MD;  Location: [facility];  Service: Open Heart Surgery;  Laterality: N/A;    Current Medications: No outpatient medications have been marked as taking for the 07/19/20 encounter (Appointment) with Richardo Priest, MD.     Allergies:   Penicillins   Social History   Socioeconomic History  . Marital status: Married    Spouse name: Not  on file  . Number of children: Not on file  . Years of education: Not on file  . Highest education level: Not on file  Occupational History  . Not on file  Tobacco Use  . Smoking status: Former Smoker    Years: 20.00    Types: Cigarettes    Quit date: 01/10/1979    Years since quitting: 41.5  . Smokeless tobacco: Never Used  Vaping Use  . Vaping Use: Never used  Substance and Sexual Activity  . Alcohol use: No  . Drug use: No  . Sexual activity: Not on file  Other Topics Concern  . Not on file  Social History Narrative  . Not  on file   Social Determinants of Health   Financial Resource Strain: Not on file  Food Insecurity: Not on file  Transportation Needs: Not on file  Physical Activity: Not on file  Stress: Not on file  Social Connections: Not on file     Family History: The patient's ***family history includes Heart disease in his father; Heart failure in his father. ROS:   Please see the history of present illness.    All other systems reviewed and are negative.  EKGs/Labs/Other Studies Reviewed:    The following studies were reviewed today:  EKG:  EKG ordered today and personally reviewed.  The ekg ordered today demonstrates ***  Recent Labs: 10/05/2019: Magnesium 1.8 10/09/2019: Hemoglobin 11.2; Platelets 289 11/09/2019: NT-Pro BNP 540 12/01/2019: ALT 69; BUN 11; Creatinine, Ser 0.79; Potassium 4.3; Sodium 139; TSH 0.576  Recent Lipid Panel    Component Value Date/Time   CHOL 135 12/01/2019 1433   TRIG 114 12/01/2019 1433   HDL 42 12/01/2019 1433   CHOLHDL 3.2 12/01/2019 1433   LDLCALC 72 12/01/2019 1433    Physical Exam:    VS:  There were no vitals taken for this visit.    Wt Readings from Last 3 Encounters:  04/18/20 210 lb (95.3 kg)  03/06/20 199 lb 12.8 oz (90.6 kg)  02/01/20 191 lb 9.6 oz (86.9 kg)     GEN: *** Well nourished, well developed in no acute distress HEENT: Normal NECK: No JVD; No carotid bruits LYMPHATICS: No lymphadenopathy CARDIAC: ***RRR, no murmurs, rubs, gallops RESPIRATORY:  Clear to auscultation without rales, wheezing or rhonchi  ABDOMEN: Soft, non-tender, non-distended MUSCULOSKELETAL:  No edema; No deformity  SKIN: Warm and dry NEUROLOGIC:  Alert and oriented x 3 PSYCHIATRIC:  Normal affect    Signed, Shirlee More, MD  07/18/2020 3:29 PM    [facility]

## 2020-07-19 ENCOUNTER — Ambulatory Visit: Payer: Medicare Other | Admitting: Cardiology

## 2020-07-19 ENCOUNTER — Ambulatory Visit (INDEPENDENT_AMBULATORY_CARE_PROVIDER_SITE_OTHER): Payer: Medicare Other | Admitting: Cardiology

## 2020-07-19 ENCOUNTER — Other Ambulatory Visit: Payer: Self-pay

## 2020-07-19 ENCOUNTER — Encounter: Payer: Self-pay | Admitting: Cardiology

## 2020-07-19 VITALS — BP 120/78 | HR 53 | Ht 69.0 in | Wt 213.0 lb

## 2020-07-19 DIAGNOSIS — E785 Hyperlipidemia, unspecified: Secondary | ICD-10-CM

## 2020-07-19 DIAGNOSIS — Z79899 Other long term (current) drug therapy: Secondary | ICD-10-CM

## 2020-07-19 DIAGNOSIS — I25118 Atherosclerotic heart disease of native coronary artery with other forms of angina pectoris: Secondary | ICD-10-CM

## 2020-07-19 DIAGNOSIS — I480 Paroxysmal atrial fibrillation: Secondary | ICD-10-CM

## 2020-07-19 DIAGNOSIS — Z7901 Long term (current) use of anticoagulants: Secondary | ICD-10-CM | POA: Diagnosis not present

## 2020-07-19 NOTE — Progress Notes (Signed)
Cardiology Office Note:    Date:  07/19/2020   ID:  Steve Frye, DOB 1945/05/22, MRN [id number]  PCP:  Street, Sharon Mt, MD  Cardiologist:  Shirlee More, MD    Referring MD: [address], [place] Mt, *    ASSESSMENT:    1. Paroxysmal atrial fibrillation (HCC)   2. On amiodarone therapy   3. Chronic anticoagulation   4. Coronary artery disease of native artery of native heart with stable angina pectoris ([place])   5. Hyperlipidemia, unspecified hyperlipidemia type    PLAN:    In order of problems listed above:  1. He is maintaining sinus rhythm keep amiodarone 300 mg/day recheck thyroid liver and amiodarone level.  He will continue his anticoagulation with likely clinical recurrence during his respiratory infection 2. Stable CAD it would be uncommon but his symptoms are suggestive of recurrent ischemia and undergo myocardial perfusion test in the office.  If high risk markers he would require repeat coronary angiography.  For now continue his anticoagulant beta-blocker and statin 3. Stable lipids are at target   Next appointment: 6 months   Medication Adjustments/Labs and Tests Ordered: Current medicines are reviewed at length with the patient today.  Concerns regarding medicines are outlined above.  No orders of the defined types were placed in this encounter.  No orders of the defined types were placed in this encounter.   Chief Complaint  Patient presents with  . Follow-up    History of Present Illness:    Steve Frye is a 75 y.o. male with a hx of paroxysmal atrial fibrillation maintaining sinus rhythm on amiodarone anticoagulated CAD with CABG and hyper lipidemia last seen 04/18/2020.  Is to x-ray 02/01/2020 was consistent with resolving COVID-19 pneumonia possible small right pleural effusion. Compliance with diet, lifestyle and medications: Yes  Recently has had an upper respiratory infection treated with 2 courses of antibiotics and is improving.  Early on in the  illness he had rapid heart rhythm transiently at rates of 150 but not persistent.  His cough is finally clearing.  He had a chest x-ray done was told it was good I requested a copy he is on amiodarone.  Fortunately is in sinus rhythm today. He is slowly and steadily improving but he has sternal pain sharp when he coughs and has developed a pattern when he walks he develops tightness substernally he can continue to walk it only occurs during his daily activity and although uncommon there can be early graft occlusion or progression of CAD and will do a myocardial perfusion test in our office.  His most recent labs 05/08/2020 showed lipids at target LDL 72 cholesterol 147 A1c 5.7% creatinine normal Past Medical History:  Diagnosis Date  . Acute coronary syndrome ([place]) 09/28/2019  . Arthritis 01/15/2017  . Atrial fibrillation ([place])   . Cancer ([place])    skin cancer  . Chest mass 12/04/2015  . Coronary artery disease   . Coronary artery disease of native artery of native heart with stable angina pectoris ([place])   . Cutaneous abscess of chest wall 12/04/2015  . Dyspnea   . Dysrhythmia    afib  . Epidermoid cyst of skin 12/04/2015  . GERD (gastroesophageal reflux disease) 01/15/2017  . High risk medication use 01/10/2015   Overview:  Overview:  flecanide  . Hyperlipidemia 01/15/2017  . LAE (left atrial enlargement) 01/15/2017  . Open wound of chest wall 04/01/2016  . PAF (paroxysmal atrial fibrillation) ([place]) 01/10/2015   Overview:  CHADS2 vasc score=1  .  Postop check 11/03/2019  . S/P CABG x 3 10/04/2019    Past Surgical History:  Procedure Laterality Date  . APPENDECTOMY    . CARDIAC CATHETERIZATION     09/23/19  . CHOLECYSTECTOMY    . CORONARY ARTERY BYPASS GRAFT N/A 10/04/2019   Procedure: CORONARY ARTERY BYPASS GRAFTING (CABG) times three using right greater saphenous vein and left internal mammary ;  Surgeon: Ivin Poot, MD;  Location: [facility];  Service: Open Heart Surgery;  Laterality: N/A;  . CYST  EXCISION    . ENDOVEIN HARVEST OF GREATER SAPHENOUS VEIN Right 10/04/2019   Procedure: [name] Ropes Of Greater Saphenous Vein;  Surgeon: Ivin Poot, MD;  Location: [facility];  Service: Open Heart Surgery;  Laterality: Right;  . EXCISION OF KELOID N/A 10/04/2019   Procedure: Excision Of Sternal Keloid;  Surgeon: Ivin Poot, MD;  Location: [facility];  Service: Open Heart Surgery;  Laterality: N/A;  . LAPAROSCOPIC LYSIS INTESTINAL ADHESIONS  2012  . LEFT HEART CATH AND CORONARY ANGIOGRAPHY N/A 09/23/2019   Procedure: LEFT HEART CATH AND CORONARY ANGIOGRAPHY;  Surgeon: Belva Crome, MD;  Location: [facility];  Service: Cardiovascular;  Laterality: N/A;  . TEE WITHOUT CARDIOVERSION N/A 10/04/2019   Procedure: TRANSESOPHAGEAL ECHOCARDIOGRAM (TEE);  Surgeon: Prescott Gum, Collier Salina, MD;  Location: [facility];  Service: Open Heart Surgery;  Laterality: N/A;    Current Medications: Current Meds  Medication Sig  . amiodarone (PACERONE) 200 MG tablet Take 1.5 tablets (300 mg total) by mouth daily.  . Ascorbic Acid (VITAMIN C) 1000 MG tablet Take 1,000 mg by mouth daily.  Marland Kitchen ELIQUIS 5 MG TABS tablet TAKE 1 TABLET BY MOUTH TWICE A DAY  . famotidine (PEPCID) 40 MG tablet TAKE 1 TABLET BY MOUTH EVERY DAY  . finasteride (PROSCAR) 5 MG tablet Take 5 mg daily by mouth.  . loratadine (CLARITIN) 10 MG tablet Take 10 mg by mouth daily.  . metoprolol tartrate (LOPRESSOR) 25 MG tablet TAKE 1 TABLET BY MOUTH TWICE A DAY  . Multiple Vitamins-Minerals (PRESERVISION AREDS 2 PO) Take 1 tablet by mouth daily.  . nitroGLYCERIN (NITROSTAT) 0.4 MG SL tablet Take 0.4 mg by mouth.  . Nutritional Supplements (FRUIT & VEGETABLE DAILY PO) Take 2 tablets daily by mouth.  . Probiotic Product (ALIGN PO) Take 1 tablet by mouth daily.  . simvastatin (ZOCOR) 40 MG tablet TAKE 1 TABLET BY MOUTH EVERY DAY  . triamcinolone (NASACORT) 55 MCG/ACT AERO nasal inhaler Using two sprays in each nostril every night as directed.   .  triamcinolone ointment (KENALOG) 0.1 % Apply 1 application topically 3 (three) times daily as needed (Dry skin).   Marland Kitchen VITAMIN D PO Take 2,000 mg by mouth daily.  . Zinc 50 MG TABS Take 50 mg by mouth daily.     Allergies:   Penicillins   Social History   Socioeconomic History  . Marital status: Married    Spouse name: Not on file  . Number of children: Not on file  . Years of education: Not on file  . Highest education level: Not on file  Occupational History  . Not on file  Tobacco Use  . Smoking status: Former Smoker    Years: 20.00    Types: Cigarettes    Quit date: 01/10/1979    Years since quitting: 41.5  . Smokeless tobacco: Never Used  Vaping Use  . Vaping Use: Never used  Substance and Sexual Activity  . Alcohol use: No  .  Drug use: No  . Sexual activity: Not on file  Other Topics Concern  . Not on file  Social History Narrative  . Not on file   Social Determinants of Health   Financial Resource Strain: Not on file  Food Insecurity: Not on file  Transportation Needs: Not on file  Physical Activity: Not on file  Stress: Not on file  Social Connections: Not on file     Family History: The patient's family history includes Heart disease in his father; Heart failure in his father. ROS:   Please see the history of present illness.    All other systems reviewed and are negative.  EKGs/Labs/Other Studies Reviewed:    The following studies were reviewed today:  EKG:  EKG ordered today and personally reviewed.  The ekg ordered today demonstrates sinus bradycardia 53 bpm normal QT interval normal EKG  Recent Labs: 10/05/2019: Magnesium 1.8 10/09/2019: Hemoglobin 11.2; Platelets 289 11/09/2019: NT-Pro BNP 540 12/01/2019: ALT 69; BUN 11; Creatinine, Ser 0.79; Potassium 4.3; Sodium 139; TSH 0.576  Recent Lipid Panel    Component Value Date/Time   CHOL 135 12/01/2019 1433   TRIG 114 12/01/2019 1433   HDL 42 12/01/2019 1433   CHOLHDL 3.2 12/01/2019 1433   LDLCALC  72 12/01/2019 1433    Physical Exam:    VS:  BP 120/78 (BP Location: Right Arm, Patient Position: Sitting, Cuff Size: Normal)   Pulse (!) 53   Ht 5\' 9"  (1.753 m)   Wt 213 lb (96.6 kg)   SpO2 96%   BMI 31.45 kg/m     Wt Readings from Last 3 Encounters:  07/19/20 213 lb (96.6 kg)  04/18/20 210 lb (95.3 kg)  03/06/20 199 lb 12.8 oz (90.6 kg)     GEN:  Well nourished, well developed in no acute distress HEENT: Normal NECK: No JVD; No carotid bruits LYMPHATICS: No lymphadenopathy CARDIAC: He has mid sternal pain to palpation and has a sternal keloid.  RRR, no murmurs, rubs, gallops RESPIRATORY:  Clear to auscultation without rales, wheezing or rhonchi  ABDOMEN: Soft, non-tender, non-distended MUSCULOSKELETAL:  No edema; No deformity  SKIN: Warm and dry NEUROLOGIC:  Alert and oriented x 3 PSYCHIATRIC:  Normal affect    Signed, Shirlee More, MD  07/19/2020 2:38 PM    [facility]

## 2020-07-19 NOTE — Addendum Note (Signed)
Addended by: Resa Miner I on: 07/19/2020 02:46 PM   Modules accepted: Orders

## 2020-07-19 NOTE — Patient Instructions (Addendum)
Medication Instructions:  Your physician recommends that you continue on your current medications as directed. Please refer to the Current Medication list given to you today.  *If you need a refill on your cardiac medications before your next appointment, please call your pharmacy*   Lab Work: Your physician recommends that you return for lab work in: TODAY Amiodarone, CMP, TSH, T3, T4 If you have labs (blood work) drawn today and your tests are completely normal, you will receive your results only by: Marland Kitchen MyChart Message (if you have MyChart) OR . A paper copy in the mail If you have any lab test that is abnormal or we need to change your treatment, we will call you to review the results.   Testing/Procedures:   [facility] [address] [place], [place] [address] Phone:  [phone]    Please arrive 15 minutes prior to your appointment time for registration and insurance purposes.  The test will take approximately 3 to 4 hours to complete; you may bring reading material.  If someone comes with you to your appointment, they will need to remain in the main lobby due to limited space in the testing area. **If you are pregnant or breastfeeding, please notify the nuclear lab prior to your appointment**  How to prepare for your Myocardial Perfusion Test: . Do not eat or drink 3 hours prior to your test, except you may have water. . Do not consume products containing caffeine (regular or decaffeinated) 12 hours prior to your test. (ex: coffee, chocolate, sodas, tea). . Do bring a list of your current medications with you.  If not listed below, you may take your medications as normal. . Do wear comfortable clothes (no dresses or overalls) and walking shoes, tennis shoes preferred (No heels or open toe shoes are allowed). . Do NOT wear cologne, perfume, aftershave, or lotions (deodorant is allowed). . If these instructions are not followed, your test will have to  be rescheduled.  Please report to [address] for your test.  If you have questions or concerns about your appointment, you can call the [facility] at [phone].  If you cannot keep your appointment, please provide 24 hours notification to the Nuclear Lab, to avoid a possible $50 charge to your account.    Follow-Up: At [facility], you and your health needs are our priority.  As part of our continuing mission to provide you with exceptional heart care, we have created designated Provider Care Teams.  These Care Teams include your primary Cardiologist (physician) and Advanced Practice Providers (APPs -  Physician Assistants and Nurse Practitioners) who all work together to provide you with the care you need, when you need it.  We recommend signing up for the patient portal called "MyChart".  Sign up information is provided on this After Visit Summary.  MyChart is used to connect with patients for Virtual Visits (Telemedicine).  Patients are able to view lab/test results, encounter notes, upcoming appointments, etc.  Non-urgent messages can be sent to your provider as well.   To learn more about what you can do with MyChart, go to [email or web address].    Your next appointment:   6 month(s)  The format for your next appointment:   In Person  Provider:   Shirlee More, MD   Other Instructions

## 2020-07-19 NOTE — Addendum Note (Signed)
Addended by: Resa Miner I on: 07/19/2020 02:43 PM   Modules accepted: Orders

## 2020-07-24 ENCOUNTER — Telehealth: Payer: Self-pay

## 2020-07-24 ENCOUNTER — Telehealth: Payer: Self-pay | Admitting: *Deleted

## 2020-07-24 ENCOUNTER — Encounter: Payer: Self-pay | Admitting: *Deleted

## 2020-07-24 NOTE — Telephone Encounter (Signed)
Left message on voicemail per DPR in reference to upcoming appointment scheduled on 07/26/20 at 0745 with detailed instructions given per Myocardial Perfusion Study Information Sheet for the test. LM to arrive 15 minutes early, and that it is imperative to arrive on time for appointment to keep from having the test rescheduled. If you need to cancel or reschedule your appointment, please call the office within 24 hours of your appointment. Failure to do so may result in a cancellation of your appointment, and a $50 no show fee. Phone number given for call back for any questions. Olive Motyka, Ranae Palms

## 2020-07-24 NOTE — Telephone Encounter (Signed)
Attestation needs to be signed for patients stress test.

## 2020-07-24 NOTE — Addendum Note (Signed)
Addended by: Shirlee More on: 07/24/2020 12:13 PM   Modules accepted: Orders

## 2020-07-25 ENCOUNTER — Telehealth (HOSPITAL_COMMUNITY): Payer: Self-pay | Admitting: *Deleted

## 2020-07-25 NOTE — Telephone Encounter (Signed)
Patient given detailed instructions per Myocardial Perfusion Study Information Sheet for the test on 08/02/20 at 1100. Patient notified to arrive 15 minutes early and that it is imperative to arrive on time for appointment to keep from having the test rescheduled.  If you need to cancel or reschedule your appointment, please call the office within 24 hours of your appointment. . Patient verbalized understanding.Steve Frye, Ranae Palms

## 2020-07-26 LAB — COMPREHENSIVE METABOLIC PANEL
ALT: 34 IU/L (ref 0–44)
AST: 29 IU/L (ref 0–40)
Albumin/Globulin Ratio: 1.3 (ref 1.2–2.2)
Albumin: 3.6 g/dL — ABNORMAL LOW (ref 3.7–4.7)
Alkaline Phosphatase: 85 IU/L (ref 44–121)
BUN/Creatinine Ratio: 13 (ref 10–24)
BUN: 12 mg/dL (ref 8–27)
Bilirubin Total: 0.4 mg/dL (ref 0.0–1.2)
CO2: 21 mmol/L (ref 20–29)
Calcium: 8.8 mg/dL (ref 8.6–10.2)
Chloride: 104 mmol/L (ref 96–106)
Creatinine, Ser: 0.94 mg/dL (ref 0.76–1.27)
Globulin, Total: 2.7 g/dL (ref 1.5–4.5)
Glucose: 148 mg/dL — ABNORMAL HIGH (ref 65–99)
Potassium: 4.2 mmol/L (ref 3.5–5.2)
Sodium: 137 mmol/L (ref 134–144)
Total Protein: 6.3 g/dL (ref 6.0–8.5)
eGFR: 85 mL/min/{1.73_m2} (ref >59)

## 2020-07-26 LAB — TSH+T4F+T3FREE
Free T4: 1.8 ng/dL — ABNORMAL HIGH (ref 0.82–1.77)
T3, Free: 2.3 pg/mL (ref 2.0–4.4)
TSH: 0.775 u[IU]/mL (ref 0.450–4.500)

## 2020-07-26 LAB — AMIODARONE (CORDARONE), S/P
AMIODARONE: 1626 ng/mL (ref 1000–2500)
DESETHYLAMIODARONE: 974 ng/mL

## 2020-07-27 ENCOUNTER — Telehealth: Payer: Self-pay

## 2020-07-27 MED ORDER — AMIODARONE HCL 200 MG PO TABS: 200.0000 mg | ORAL_TABLET | Freq: Every day | ORAL | 3 refills | Status: DC

## 2020-07-27 NOTE — Telephone Encounter (Signed)
-----   Message from Richardo Priest, MD sent at 07/27/2020  8:00 AM EDT ----- Stable results, amiodarone level is good and reduce to 200 mg once daily

## 2020-07-27 NOTE — Telephone Encounter (Signed)
Spoke with patient regarding results and recommendation.  Patient verbalizes understanding and is agreeable to plan of care. Advised patient to call back with any issues or concerns.  

## 2020-07-31 DIAGNOSIS — L570 Actinic keratosis: Secondary | ICD-10-CM | POA: Diagnosis not present

## 2020-07-31 DIAGNOSIS — C44629 Squamous cell carcinoma of skin of left upper limb, including shoulder: Secondary | ICD-10-CM | POA: Diagnosis not present

## 2020-08-02 ENCOUNTER — Ambulatory Visit (INDEPENDENT_AMBULATORY_CARE_PROVIDER_SITE_OTHER): Payer: Medicare Other

## 2020-08-02 ENCOUNTER — Other Ambulatory Visit: Payer: Self-pay

## 2020-08-02 DIAGNOSIS — I25118 Atherosclerotic heart disease of native coronary artery with other forms of angina pectoris: Secondary | ICD-10-CM | POA: Diagnosis not present

## 2020-08-02 DIAGNOSIS — Z7901 Long term (current) use of anticoagulants: Secondary | ICD-10-CM | POA: Diagnosis not present

## 2020-08-02 DIAGNOSIS — I480 Paroxysmal atrial fibrillation: Secondary | ICD-10-CM | POA: Diagnosis not present

## 2020-08-02 DIAGNOSIS — E785 Hyperlipidemia, unspecified: Secondary | ICD-10-CM

## 2020-08-02 DIAGNOSIS — Z79899 Other long term (current) drug therapy: Secondary | ICD-10-CM | POA: Diagnosis not present

## 2020-08-02 LAB — MYOCARDIAL PERFUSION IMAGING
LV dias vol: 102 mL (ref 62–150)
LV sys vol: 36 mL
Peak HR: 63 {beats}/min
Rest HR: 55 {beats}/min
SDS: 1
SRS: 0
SSS: 1
TID: 0.98

## 2020-08-02 MED ORDER — REGADENOSON 0.4 MG/5ML IV SOLN
0.4000 mg | Freq: Once | INTRAVENOUS | Status: AC
  Administered 2020-08-02: 0.4 mg via INTRAVENOUS

## 2020-08-02 MED ORDER — TECHNETIUM TC 99M TETROFOSMIN IV KIT
30.6900 | PACK | Freq: Once | INTRAVENOUS | Status: AC | PRN
  Administered 2020-08-02: 30.69 via INTRAVENOUS

## 2020-08-02 MED ORDER — TECHNETIUM TC 99M TETROFOSMIN IV KIT
10.4000 | PACK | Freq: Once | INTRAVENOUS | Status: AC | PRN
Start: 2020-08-02 — End: 2020-08-02
  Administered 2020-08-02: 10.4 via INTRAVENOUS

## 2020-08-03 ENCOUNTER — Telehealth: Payer: Self-pay

## 2020-08-03 NOTE — Telephone Encounter (Signed)
-----   Message from Richardo Priest, MD sent at 08/03/2020 12:34 PM EDT ----- Good result this is a normal test and I find it very reassuring.

## 2020-08-03 NOTE — Telephone Encounter (Signed)
Patient notified of test results 

## 2020-09-20 ENCOUNTER — Other Ambulatory Visit: Payer: Self-pay | Admitting: Cardiology

## 2020-09-20 DIAGNOSIS — H25813 Combined forms of age-related cataract, bilateral: Secondary | ICD-10-CM | POA: Diagnosis not present

## 2020-09-20 NOTE — Telephone Encounter (Signed)
Refill sent to pharmacy.   

## 2020-10-08 DIAGNOSIS — J329 Chronic sinusitis, unspecified: Secondary | ICD-10-CM | POA: Diagnosis not present

## 2020-10-08 DIAGNOSIS — J40 Bronchitis, not specified as acute or chronic: Secondary | ICD-10-CM | POA: Diagnosis not present

## 2020-10-08 DIAGNOSIS — D6869 Other thrombophilia: Secondary | ICD-10-CM | POA: Diagnosis not present

## 2020-10-08 DIAGNOSIS — I4891 Unspecified atrial fibrillation: Secondary | ICD-10-CM | POA: Diagnosis not present

## 2020-11-14 ENCOUNTER — Other Ambulatory Visit: Payer: Self-pay | Admitting: Cardiology

## 2020-11-15 DIAGNOSIS — Z20828 Contact with and (suspected) exposure to other viral communicable diseases: Secondary | ICD-10-CM | POA: Diagnosis not present

## 2020-11-15 DIAGNOSIS — J40 Bronchitis, not specified as acute or chronic: Secondary | ICD-10-CM | POA: Diagnosis not present

## 2020-11-15 DIAGNOSIS — Z6828 Body mass index (BMI) 28.0-28.9, adult: Secondary | ICD-10-CM | POA: Diagnosis not present

## 2020-11-15 DIAGNOSIS — J329 Chronic sinusitis, unspecified: Secondary | ICD-10-CM | POA: Diagnosis not present

## 2020-11-19 ENCOUNTER — Other Ambulatory Visit: Payer: Self-pay | Admitting: Cardiology

## 2020-12-17 ENCOUNTER — Encounter: Payer: Self-pay | Admitting: Allergy and Immunology

## 2020-12-17 ENCOUNTER — Other Ambulatory Visit: Payer: Self-pay

## 2020-12-17 ENCOUNTER — Ambulatory Visit (INDEPENDENT_AMBULATORY_CARE_PROVIDER_SITE_OTHER): Payer: Medicare Other | Admitting: Allergy and Immunology

## 2020-12-17 VITALS — BP 130/82 | HR 60 | Resp 16 | Ht 70.0 in | Wt 216.6 lb

## 2020-12-17 DIAGNOSIS — J3089 Other allergic rhinitis: Secondary | ICD-10-CM | POA: Diagnosis not present

## 2020-12-17 DIAGNOSIS — J4489 Other specified chronic obstructive pulmonary disease: Secondary | ICD-10-CM

## 2020-12-17 DIAGNOSIS — J449 Chronic obstructive pulmonary disease, unspecified: Secondary | ICD-10-CM

## 2020-12-17 DIAGNOSIS — K219 Gastro-esophageal reflux disease without esophagitis: Secondary | ICD-10-CM

## 2020-12-17 DIAGNOSIS — I25118 Atherosclerotic heart disease of native coronary artery with other forms of angina pectoris: Secondary | ICD-10-CM

## 2020-12-17 MED ORDER — BREZTRI AEROSPHERE 160-9-4.8 MCG/ACT IN AERO: 2.0000 | INHALATION_SPRAY | Freq: Two times a day (BID) | RESPIRATORY_TRACT | 5 refills | Status: DC

## 2020-12-17 NOTE — Progress Notes (Signed)
[facility] - Steve [facility]e - Steve [facility]e   Follow-up Note  Referring Provider: Street, Steve Frye, * Primary Provider: Street, Steve Mt, MD Date of Office Visit: 12/17/2020  Subjective:   Steve Frye (DOB: 09/05/1945) is a 75 y.o. male who returns to the [facility] on 12/17/2020 in re-evaluation of the following:  HPI: [name] presents to this clinic in reevaluation of rhinitis and LPR.  His last visit to this clinic was 15 December 2019.  For the most part he has really had very little issue with his nose and he feels like his reflux is under good control and he has not had too much problems with his throat.  He has been consistently using a nasal steroid and he has been using famotidine.  But what has been a problem is "bronchitis".  He has had 3 episodes of bronchitis this year in January, April, and July.  He has unrelenting cough usually in association with some rhinitis and he has been treated with an antibiotic and a steroid on 3 different occasions this year.  He has been given albuterol.  He still has a little bit of chronic cough and he has been using albuterol on a daily basis for his cough.  He apparently had a chest x-ray in January 2022 which did not identify any significant abnormality, the results of which were not available for review today.  He has a greater than 30-year pack history of smoking which fortunately was discontinued sometime in his 92s.  Although he had 2 [place] vaccines in the past he contracted COVID in September 2021 and apparently this was a particularly bad infection with a 2-week hospitalization required but fortunately no long-term sequela.  Allergies as of 12/17/2020       Reactions   Penicillins Hives   In his 20's        Medication List    ALIGN PO Take 1 tablet by mouth daily.   amiodarone 200 MG tablet Commonly known as: PACERONE Take 1 tablet (200 mg total) by mouth daily.   Eliquis 5 MG  Tabs tablet Generic drug: apixaban TAKE 1 TABLET BY MOUTH TWICE A DAY   famotidine 40 MG tablet Commonly known as: PEPCID TAKE 1 TABLET BY MOUTH EVERY DAY   finasteride 5 MG tablet Commonly known as: PROSCAR Take 5 mg daily by mouth.   FRUIT & VEGETABLE DAILY PO Take 2 tablets daily by mouth.   metoprolol tartrate 25 MG tablet Commonly known as: LOPRESSOR TAKE 0.5 TABLETS BY MOUTH 2 TIMES DAILY.   nitroGLYCERIN 0.4 MG SL tablet Commonly known as: NITROSTAT Take 0.4 mg by mouth.   PRESERVISION AREDS 2 PO Take 1 tablet by mouth daily.   simvastatin 40 MG tablet Commonly known as: ZOCOR TAKE 1 TABLET BY MOUTH EVERY DAY   triamcinolone 55 MCG/ACT Aero nasal inhaler Commonly known as: NASACORT Using two sprays in each nostril every night as directed.   triamcinolone ointment 0.1 % Commonly known as: KENALOG Apply 1 application topically 3 (three) times daily as needed (Dry skin).   vitamin C 1000 MG tablet Take 1,000 mg by mouth daily.   VITAMIN D PO Take 2,000 mg by mouth daily.   Zinc 50 MG Tabs Take 50 mg by mouth daily.        Past Medical History:  Diagnosis Date   Acute coronary syndrome ([facility]) 09/28/2019   Arthritis 01/15/2017   Atrial fibrillation ([place])    Cancer (  [facility])    skin cancer   Chest mass 12/04/2015   Coronary artery disease    Coronary artery disease of native artery of native heart with stable angina pectoris ([facility])    Cutaneous abscess of chest wall 12/04/2015   Dyspnea    Dysrhythmia    afib   Epidermoid cyst of skin 12/04/2015   GERD (gastroesophageal reflux disease) 01/15/2017   Steve risk medication use 01/10/2015   Overview:  Overview:  flecanide   Hyperlipidemia 01/15/2017   LAE (left atrial enlargement) 01/15/2017   Open wound of chest wall 04/01/2016   PAF (paroxysmal atrial fibrillation) ([facility]) 01/10/2015   Overview:  CHADS2 vasc score=1   Postop check 11/03/2019   S/P CABG x 3 10/04/2019    Past Surgical History:  Procedure Laterality  Date   APPENDECTOMY     CARDIAC CATHETERIZATION     09/23/19   CHOLECYSTECTOMY     CORONARY ARTERY BYPASS GRAFT N/A 10/04/2019   Procedure: CORONARY ARTERY BYPASS GRAFTING (CABG) times three using right greater saphenous vein and left internal mammary ;  Surgeon: Ivin Poot, MD;  Location: [facility];  Service: Open Heart Surgery;  Laterality: N/A;   CYST EXCISION     ENDOVEIN HARVEST OF GREATER SAPHENOUS VEIN Right 10/04/2019   Procedure: [name] Of Greater Saphenous Vein;  Surgeon: Ivin Poot, MD;  Location: [facility];  Service: Open Heart Surgery;  Laterality: Right;   EXCISION OF KELOID N/A 10/04/2019   Procedure: Excision Of Sternal Keloid;  Surgeon: Ivin Poot, MD;  Location: [facility];  Service: Open Heart Surgery;  Laterality: N/A;   LAPAROSCOPIC LYSIS INTESTINAL ADHESIONS  2012   LEFT HEART CATH AND CORONARY ANGIOGRAPHY N/A 09/23/2019   Procedure: LEFT HEART CATH AND CORONARY ANGIOGRAPHY;  Surgeon: Belva Crome, MD;  Location: [facility];  Service: Cardiovascular;  Laterality: N/A;   TEE WITHOUT CARDIOVERSION N/A 10/04/2019   Procedure: TRANSESOPHAGEAL ECHOCARDIOGRAM (TEE);  Surgeon: Prescott Gum, Collier Salina, MD;  Location: [facility];  Service: Open Heart Surgery;  Laterality: N/A;    Review of systems negative except as noted in HPI / PMHx or noted below:  Review of Systems  Constitutional: Negative.   HENT: Negative.    Eyes: Negative.   Respiratory: Negative.    Cardiovascular: Negative.   Gastrointestinal: Negative.   Genitourinary: Negative.   Musculoskeletal: Negative.   Skin: Negative.   Neurological: Negative.   Endo/Heme/Allergies: Negative.   Psychiatric/Behavioral: Negative.      Objective:   Vitals:   12/17/20 1345  BP: 130/82  Pulse: 60  Resp: 16  SpO2: 98%   Height: '5\' 10"'$  (177.8 cm)  Weight: 216 lb 9.6 oz (98.2 kg)   Physical Exam Constitutional:      Appearance: He is not diaphoretic.  HENT:     Head: Normocephalic.     Right Ear:  Tympanic membrane, ear canal and external ear normal.     Left Ear: Tympanic membrane, ear canal and external ear normal.     Nose: Nose normal. No mucosal edema or rhinorrhea.     Mouth/Throat:     Pharynx: Uvula midline. No oropharyngeal exudate.  Eyes:     Conjunctiva/sclera: Conjunctivae normal.  Neck:     Thyroid: No thyromegaly.     Trachea: Trachea normal. No tracheal tenderness or tracheal deviation.  Cardiovascular:     Rate and Rhythm: Normal rate and regular rhythm.     Heart sounds: Normal heart sounds, S1 normal and  S2 normal. No murmur heard. Pulmonary:     Effort: No respiratory distress.     Breath sounds: Normal breath sounds. No stridor. No wheezing or rales.  Lymphadenopathy:     Head:     Right side of head: No tonsillar adenopathy.     Left side of head: No tonsillar adenopathy.     Cervical: No cervical adenopathy.  Skin:    Findings: No erythema or rash.     Nails: There is no clubbing.  Neurological:     Mental Status: He is alert.    Diagnostics:    Spirometry was performed and demonstrated an FEV1 of 2.07 at 69 % of predicted.  Assessment and Plan:   1. COPD with asthma ([place])   2. Perennial allergic rhinitis   3. LPRD (laryngopharyngeal reflux disease)     1.  Continue to treat and prevent inflammation:   A.  Nasacort- 1-2 sprays each nostril 1 time per day  B.  Breztri - 2 inhalations 2 times per day (empty lungs)  2. Continue to Treat and prevent reflux:   A. Famotidine '40mg'$  tablet daily  3. If needed:   A. Nasal saline  B. OTC antihistamine  C. Albuterol HFA - 2 inhalations every 4-6 hours  4.  Obtain fall flu vaccine    5. Return to clinic in 4 weeks or earlier if problem  Dennis probably has a component of COPD/asthma and we will treat him with a triple inhaler as noted above.  His upper airway disease appears to be under good control with his Nasacort and his reflux appears to be under good control with famotidine.  Given the fact  that he had such a difficult time with COVID and he has been having 3 episodes of bronchitis in 2022 we need to consider the possibility that his immune system may not be up to its task of defending against infections and he may require a check of his immunoglobulins if he does not do well with the plan noted above.  Allena Katz, MD Allergy / Immunology [place]

## 2020-12-17 NOTE — Patient Instructions (Addendum)
  1.  Continue to treat and prevent inflammation:   A.  Nasacort- 1-2 sprays each nostril 1 time per day  B.  Breztri - 2 inhalations 2 times per day (empty lungs)  2. Continue to Treat and prevent reflux:   A. Famotidine '40mg'$  tablet daily  3. If needed:   A. Nasal saline  B. OTC antihistamine  C. Albuterol HFA - 2 inhalations every 4-6 hours  4.  Obtain fall flu vaccine    5. Return to clinic in 4 weeks or earlier if problem

## 2020-12-18 ENCOUNTER — Encounter: Payer: Self-pay | Admitting: Allergy and Immunology

## 2020-12-21 ENCOUNTER — Encounter: Payer: Self-pay | Admitting: Allergy and Immunology

## 2020-12-26 DIAGNOSIS — H25813 Combined forms of age-related cataract, bilateral: Secondary | ICD-10-CM | POA: Diagnosis not present

## 2021-01-21 ENCOUNTER — Ambulatory Visit: Payer: Medicare Other | Admitting: Allergy and Immunology

## 2021-01-29 ENCOUNTER — Other Ambulatory Visit: Payer: Self-pay | Admitting: Cardiology

## 2021-01-29 ENCOUNTER — Other Ambulatory Visit: Payer: Self-pay | Admitting: Allergy and Immunology

## 2021-01-30 ENCOUNTER — Encounter: Payer: Self-pay | Admitting: Cardiology

## 2021-01-30 ENCOUNTER — Ambulatory Visit (INDEPENDENT_AMBULATORY_CARE_PROVIDER_SITE_OTHER): Payer: Medicare Other | Admitting: Cardiology

## 2021-01-30 ENCOUNTER — Other Ambulatory Visit: Payer: Self-pay

## 2021-01-30 VITALS — BP 130/70 | HR 58 | Ht 70.0 in | Wt 213.0 lb

## 2021-01-30 DIAGNOSIS — E785 Hyperlipidemia, unspecified: Secondary | ICD-10-CM

## 2021-01-30 DIAGNOSIS — I480 Paroxysmal atrial fibrillation: Secondary | ICD-10-CM | POA: Diagnosis not present

## 2021-01-30 DIAGNOSIS — Z7901 Long term (current) use of anticoagulants: Secondary | ICD-10-CM

## 2021-01-30 DIAGNOSIS — I25118 Atherosclerotic heart disease of native coronary artery with other forms of angina pectoris: Secondary | ICD-10-CM | POA: Diagnosis not present

## 2021-01-30 DIAGNOSIS — Z23 Encounter for immunization: Secondary | ICD-10-CM

## 2021-01-30 DIAGNOSIS — Z79899 Other long term (current) drug therapy: Secondary | ICD-10-CM | POA: Diagnosis not present

## 2021-01-30 NOTE — Patient Instructions (Signed)
Medication Instructions:  Your physician recommends that you continue on your current medications as directed. Please refer to the Current Medication list given to you today.  *If you need a refill on your cardiac medications before your next appointment, please call your pharmacy*   Lab Work: Your physician recommends that you return for lab work in: [place], Lipids If you have labs (blood work) drawn today and your tests are completely normal, you will receive your results only by: [place] (if you have MyChart) OR A paper copy in the mail If you have any lab test that is abnormal or we need to change your treatment, we will call you to review the results.   Testing/Procedures: None   Follow-Up: At [facility], you and your health needs are our priority.  As part of our continuing mission to provide you with exceptional heart care, we have created designated Provider Care Teams.  These Care Teams include your primary Cardiologist (physician) and Advanced Practice Providers (APPs -  Physician Assistants and Nurse Practitioners) who all work together to provide you with the care you need, when you need it.  We recommend signing up for the patient portal called "MyChart".  Sign up information is provided on this After Visit Summary.  MyChart is used to connect with patients for Virtual Visits (Telemedicine).  Patients are able to view lab/test results, encounter notes, upcoming appointments, etc.  Non-urgent messages can be sent to your provider as well.   To learn more about what you can do with MyChart, go to [email or web address].    Your next appointment:   9 month(s)  The format for your next appointment:   In Person  Provider:   Shirlee More, MD   Other Instructions

## 2021-01-30 NOTE — Progress Notes (Signed)
Cardiology Office Note:    Date:  01/30/2021   ID:  Steve Frye, DOB 1945/10/22, MRN [id number]  PCP:  Street, Sharon Mt, MD  Cardiologist:  Shirlee More, MD    Referring MD: [address] Sharon Mt, *    ASSESSMENT:    1. Paroxysmal atrial fibrillation (HCC)   2. On amiodarone therapy   3. Chronic anticoagulation   4. Coronary artery disease of native artery of native heart with stable angina pectoris ([place])   5. Hyperlipidemia, unspecified hyperlipidemia type    PLAN:    In order of problems listed above:  Overall Kesler is doing well maintaining sinus rhythm on low-dose amiodarone only as with during the past he recurred and will continue the same along with his anticoagulant and beta-blocker.  Clinically is not having any amiodarone lung toxicity Stable CAD continue medical therapy including his statin we will check labs today in the office including a CMP lipid profile his last LDL was 72   Next appointment: 9 months   Medication Adjustments/Labs and Tests Ordered: Current medicines are reviewed at length with the patient today.  Concerns regarding medicines are outlined above.  No orders of the defined types were placed in this encounter.  No orders of the defined types were placed in this encounter.   Chief Complaint  Patient presents with   Follow-up   Atrial Fibrillation   Coronary Artery Disease    History of Present Illness:    Steve Frye is a 75 y.o. male with a hx of paroxysmal atrial fibrillation maintaining sinus rhythm on amiodarone anticoagulated CAD with CABG and hyper lipidemia last seen 07/19/2020.  His thyroid studies were normal his amiodarone level was therapeutic   Sees Dr. Neldon Mc for COPD.  He does need a chest x-ray performed thinks in March by his PCP and we will ask if not done I will order 1 on low-dose amiodarone  Compliance with diet, lifestyle and medications: Yes  Overall doing well [facility]  not have a problem but he works in the garden bending over using his arms he will have shortness of breath that resolves quickly with rest.  He is not wheezing and does not have a cough.  He has had no further chest pain palpitation or syncope remains in sinus rhythm and tolerates his anticoagulant without bleeding  With complaints of chest pain he underwent myocardial perfusion study 08/02/2020 that was normal EF 65% low risk  Study Highlights  Nuclear stress EF: 65%. There was no ST segment deviation noted during stress. The study is normal. This is a low risk study. The left ventricular ejection fraction is normal (55-65%).     Past Surgical History:  Procedure Laterality Date   APPENDECTOMY     CARDIAC CATHETERIZATION     09/23/19   CHOLECYSTECTOMY     CORONARY ARTERY BYPASS GRAFT N/A 10/04/2019   Procedure: CORONARY ARTERY BYPASS GRAFTING (CABG) times three using right greater saphenous vein and left internal mammary ;  Surgeon: Ivin Poot, MD;  Location: [facility];  Service: Open Heart Surgery;  Laterality: N/A;   CYST EXCISION     ENDOVEIN HARVEST OF GREATER SAPHENOUS VEIN Right 10/04/2019   Procedure: [name] Of Greater Saphenous Vein;  Surgeon: Ivin Poot, MD;  Location: [facility];  Service: Open Heart Surgery;  Laterality: Right;   EXCISION OF KELOID N/A 10/04/2019   Procedure: Excision Of Sternal Keloid;  Surgeon: Ivin Poot, MD;  [facility];  Service: Open Heart Surgery;  Laterality: N/A;   LAPAROSCOPIC LYSIS INTESTINAL ADHESIONS  2012   LEFT HEART CATH AND CORONARY ANGIOGRAPHY N/A 09/23/2019   Procedure: LEFT HEART CATH AND CORONARY ANGIOGRAPHY;  Surgeon: Belva Crome, MD;  Location: [facility];  Service: Cardiovascular;  Laterality: N/A;   TEE WITHOUT CARDIOVERSION N/A 10/04/2019   Procedure: TRANSESOPHAGEAL ECHOCARDIOGRAM (TEE);  Surgeon: Prescott Gum, Collier Salina, MD;  Location: [facility];  Service: Open Heart Surgery;  Laterality: N/A;    Current  Medications: Current Meds  Medication Sig   amiodarone (PACERONE) 200 MG tablet Take 1 tablet (200 mg total) by mouth daily.   Ascorbic Acid (VITAMIN C) 1000 MG tablet Take 1,000 mg by mouth daily.   Budeson-Glycopyrrol-Formoterol (BREZTRI AEROSPHERE) 160-9-4.8 MCG/ACT AERO Inhale 2 puffs into the lungs in the morning and at bedtime.   ELIQUIS 5 MG TABS tablet TAKE 1 TABLET BY MOUTH TWICE A DAY   famotidine (PEPCID) 40 MG tablet TAKE 1 TABLET BY MOUTH EVERY DAY   finasteride (PROSCAR) 5 MG tablet Take 5 mg daily by mouth.   metoprolol tartrate (LOPRESSOR) 25 MG tablet TAKE 0.5 TABLETS BY MOUTH 2 TIMES DAILY.   Multiple Vitamins-Minerals (PRESERVISION AREDS 2 PO) Take 1 tablet by mouth daily.   nitroGLYCERIN (NITROSTAT) 0.4 MG SL tablet Take 0.4 mg by mouth.   Nutritional Supplements (FRUIT & VEGETABLE DAILY PO) Take 2 tablets daily by mouth.   Probiotic Product (ALIGN PO) Take 1 tablet by mouth daily.   simvastatin (ZOCOR) 40 MG tablet TAKE 1 TABLET BY MOUTH EVERY DAY   triamcinolone (NASACORT) 55 MCG/ACT AERO nasal inhaler Using two sprays in each nostril every night as directed.    triamcinolone ointment (KENALOG) 0.1 % Apply 1 application topically 3 (three) times daily as needed (Dry skin).    VITAMIN D PO Take 2,000 mg by mouth daily.   Zinc 50 MG TABS Take 50 mg by mouth daily.     Allergies:   Penicillins   Social History   Socioeconomic History   Marital status: Married    Spouse name: Not on file   Number of children: Not on file   Years of education: Not on file   Highest education level: Not on file  Occupational History   Not on file  Tobacco Use   Smoking status: Former    Years: 20.00    Types: Cigarettes    Quit date: 01/10/1979    Years since quitting: 42.0   Smokeless tobacco: Never  Vaping Use   Vaping Use: Never used  Substance and Sexual Activity   Alcohol use: No   Drug use: No   Sexual activity: Not on file  Other Topics Concern   Not on file   Social History Narrative   Not on file   Social Determinants of Health   Financial Resource Strain: Not on file  Food Insecurity: Not on file  Transportation Needs: Not on file  Physical Activity: Not on file  Stress: Not on file  Social Connections: Not on file     Family History: The patient's family history includes Heart disease in his father; Heart failure in his father. ROS:   Please see the history of present illness.    All other systems reviewed and are negative.  EKGs/Labs/Other Studies Reviewed:    The following studies were reviewed today:  EKG:  EKG ordered today and personally reviewed.  The ekg ordered today demonstrates sinus rhythm left  axis deviation otherwise normal EKG  Recent Labs: 07/19/2020: ALT 34; BUN 12; Creatinine, Ser 0.94; Potassium 4.2; Sodium 137; TSH 0.775  Recent Lipid Panel    Component Value Date/Time   CHOL 135 12/01/2019 1433   TRIG 114 12/01/2019 1433   HDL 42 12/01/2019 1433   CHOLHDL 3.2 12/01/2019 1433   LDLCALC 72 12/01/2019 1433    Physical Exam:    VS:  BP 130/70 (BP Location: Right Arm, Patient Position: Sitting)   Pulse (!) 58   Ht 5\' 10"  (1.778 m)   Wt 213 lb (96.6 kg)   SpO2 97%   BMI 30.56 kg/m     Wt Readings from Last 3 Encounters:  01/30/21 213 lb (96.6 kg)  12/17/20 216 lb 9.6 oz (98.2 kg)  08/02/20 213 lb (96.6 kg)     GEN:  Well nourished, well developed in no acute distress HEENT: Normal NECK: No JVD; No carotid bruits LYMPHATICS: No lymphadenopathy CARDIAC: RRR, no murmurs, rubs, gallops RESPIRATORY:  Clear to auscultation without rales, wheezing or rhonchi  ABDOMEN: Soft, non-tender, non-distended MUSCULOSKELETAL:  No edema; No deformity  SKIN: Warm and dry NEUROLOGIC:  Alert and oriented x 3 PSYCHIATRIC:  Normal affect    Signed, Shirlee More, MD  01/30/2021 1:07 PM    [facility]

## 2021-01-30 NOTE — Addendum Note (Signed)
Addended by: Resa Miner I on: 01/30/2021 01:12 PM   Modules accepted: Orders

## 2021-01-31 ENCOUNTER — Telehealth: Payer: Self-pay

## 2021-01-31 LAB — COMPREHENSIVE METABOLIC PANEL
ALT: 30 IU/L (ref 0–44)
AST: 24 IU/L (ref 0–40)
Albumin/Globulin Ratio: 2.1 (ref 1.2–2.2)
Albumin: 4.1 g/dL (ref 3.7–4.7)
Alkaline Phosphatase: 81 IU/L (ref 44–121)
BUN/Creatinine Ratio: 14 (ref 10–24)
BUN: 14 mg/dL (ref 8–27)
Bilirubin Total: 0.5 mg/dL (ref 0.0–1.2)
CO2: 20 mmol/L (ref 20–29)
Calcium: 8.7 mg/dL (ref 8.6–10.2)
Chloride: 103 mmol/L (ref 96–106)
Creatinine, Ser: 0.99 mg/dL (ref 0.76–1.27)
Globulin, Total: 2 g/dL (ref 1.5–4.5)
Glucose: 102 mg/dL — ABNORMAL HIGH (ref 70–99)
Potassium: 4.8 mmol/L (ref 3.5–5.2)
Sodium: 139 mmol/L (ref 134–144)
Total Protein: 6.1 g/dL (ref 6.0–8.5)
eGFR: 79 mL/min/{1.73_m2} (ref >59)

## 2021-01-31 LAB — LIPID PANEL
Chol/HDL Ratio: 3.4 ratio (ref 0.0–5.0)
Cholesterol, Total: 162 mg/dL (ref 100–199)
HDL: 48 mg/dL (ref >39)
LDL Chol Calc (NIH): 80 mg/dL (ref 0–99)
Triglycerides: 200 mg/dL — ABNORMAL HIGH (ref 0–149)
VLDL Cholesterol Cal: 34 mg/dL (ref 5–40)

## 2021-01-31 NOTE — Telephone Encounter (Signed)
Spoke with patient regarding results and recommendation.  Patient verbalizes understanding and is agreeable to plan of care. Advised patient to call back with any issues or concerns.  

## 2021-01-31 NOTE — Telephone Encounter (Signed)
-----   Message from Richardo Priest, MD sent at 01/31/2021 10:15 AM EDT ----- Regarding: FW: Good result no changes in treatment ----- Message ----- From: Interface, [facility] Lab Results In Sent: 01/31/2021   9:37 AM EDT To: Richardo Priest, MD

## 2021-02-06 ENCOUNTER — Ambulatory Visit (INDEPENDENT_AMBULATORY_CARE_PROVIDER_SITE_OTHER): Payer: Medicare Other | Admitting: Allergy and Immunology

## 2021-02-06 ENCOUNTER — Other Ambulatory Visit: Payer: Self-pay

## 2021-02-06 ENCOUNTER — Encounter: Payer: Self-pay | Admitting: Allergy and Immunology

## 2021-02-06 VITALS — BP 126/82 | HR 60 | Resp 16

## 2021-02-06 DIAGNOSIS — K219 Gastro-esophageal reflux disease without esophagitis: Secondary | ICD-10-CM | POA: Diagnosis not present

## 2021-02-06 DIAGNOSIS — J3089 Other allergic rhinitis: Secondary | ICD-10-CM

## 2021-02-06 DIAGNOSIS — J449 Chronic obstructive pulmonary disease, unspecified: Secondary | ICD-10-CM

## 2021-02-06 DIAGNOSIS — I25118 Atherosclerotic heart disease of native coronary artery with other forms of angina pectoris: Secondary | ICD-10-CM

## 2021-02-06 MED ORDER — FAMOTIDINE 40 MG PO TABS: 40.0000 mg | ORAL_TABLET | Freq: Every day | ORAL | 5 refills | Status: DC

## 2021-02-06 NOTE — Patient Instructions (Addendum)
  1.  Continue to treat and prevent inflammation:   A.  Nasacort- 1-2 sprays each nostril 1 time per day  B.  Breztri - 2 inhalations 2 times per day  2. Continue to Treat and prevent reflux:   A. Famotidine 40mg  tablet daily  3. If needed:   A. Nasal saline  B. OTC antihistamine  C. Albuterol HFA - 2 inhalations every 4-6 hours  4.  Return to clinic in December 2022 or earlier if problem

## 2021-02-06 NOTE — Progress Notes (Signed)
[name] [place] [facility] [place]   Follow-up Note  Referring Provider: Street, Sharon Mt, * Primary Provider: Street, Sharon Mt, MD Date of Office Visit: 02/06/2021  Subjective:   Steve Frye (DOB: 1945-09-30) is a 75 y.o. male who returns to the [facility] on 02/06/2021 in re-evaluation of the following:  HPI: [name] returns to this clinic in evaluation of COPD/asthma overlap, rhinitis, and LPR.  His last visit to this clinic was 17 December 2020.  We started him on a triple inhaler during his last visit as he had a history of recurrent bronchitis required 3 systemic steroids and antibiotics in 2022.  He feels much better at this point in time and his chronic cough has basically abated.  He was able to seed his lawn recently which is something he has not done in a long period in time.  He rarely uses a short acting bronchodilator.  He has had no issues with his nose.  Has had no issues with reflux or his throat.  Allergies as of 02/06/2021       Reactions   Penicillins Hives   In his 20's        Medication List    ALIGN PO Take 1 tablet by mouth daily.   amiodarone 200 MG tablet Commonly known as: PACERONE Take 1 tablet (200 mg total) by mouth daily.   Breztri Aerosphere 160-9-4.8 MCG/ACT Aero Generic drug: Budeson-Glycopyrrol-Formoterol Inhale 2 puffs into the lungs in the morning and at bedtime.   Eliquis 5 MG Tabs tablet Generic drug: apixaban TAKE 1 TABLET BY MOUTH TWICE A DAY   famotidine 40 MG tablet Commonly known as: PEPCID TAKE 1 TABLET BY MOUTH EVERY DAY   finasteride 5 MG tablet Commonly known as: PROSCAR Take 5 mg daily by mouth.   FRUIT & VEGETABLE DAILY PO Take 2 tablets daily by mouth.   metoprolol tartrate 25 MG tablet Commonly known as: LOPRESSOR TAKE 0.5 TABLETS BY MOUTH 2 TIMES DAILY.   nitroGLYCERIN 0.4 MG SL tablet Commonly known as: NITROSTAT Take 0.4 mg by mouth.   PRESERVISION  AREDS 2 PO Take 1 tablet by mouth daily.   simvastatin 40 MG tablet Commonly known as: ZOCOR TAKE 1 TABLET BY MOUTH EVERY DAY   triamcinolone 55 MCG/ACT Aero nasal inhaler Commonly known as: NASACORT Using two sprays in each nostril every night as directed.   triamcinolone ointment 0.1 % Commonly known as: KENALOG Apply 1 application topically 3 (three) times daily as needed (Dry skin).   vitamin C 1000 MG tablet Take 1,000 mg by mouth daily.   VITAMIN D PO Take 2,000 mg by mouth daily.   Zinc 50 MG Tabs Take 50 mg by mouth daily.    Past Medical History:  Diagnosis Date   Acute coronary syndrome ([place]) 09/28/2019   Arthritis 01/15/2017   Atrial fibrillation (HCC)    Cancer (HCC)    skin cancer   Chest mass 12/04/2015   Coronary artery disease    Coronary artery disease of native artery of native heart with stable angina pectoris (HCC)    Cutaneous abscess of chest wall 12/04/2015   Dyspnea    Dysrhythmia    afib   Epidermoid cyst of skin 12/04/2015   GERD (gastroesophageal reflux disease) 01/15/2017   High risk medication use 01/10/2015   Overview:  Overview:  flecanide   Hyperlipidemia 01/15/2017   LAE (left atrial enlargement) 01/15/2017   Open wound of chest wall  04/01/2016   PAF (paroxysmal atrial fibrillation) ([place]) 01/10/2015   Overview:  CHADS2 vasc score=1   Postop check 11/03/2019   S/P CABG x 3 10/04/2019    Past Surgical History:  Procedure Laterality Date   APPENDECTOMY     CARDIAC CATHETERIZATION     09/23/19   CHOLECYSTECTOMY     CORONARY ARTERY BYPASS GRAFT N/A 10/04/2019   Procedure: CORONARY ARTERY BYPASS GRAFTING (CABG) times three using right greater saphenous vein and left internal mammary ;  Surgeon: Ivin Poot, MD;  Location: [facility];  Service: Open Heart Surgery;  Laterality: N/A;   CYST EXCISION     ENDOVEIN HARVEST OF GREATER SAPHENOUS VEIN Right 10/04/2019   Procedure: [name] Of Greater Saphenous Vein;  Surgeon: Ivin Poot, MD;   Location: [facility];  Service: Open Heart Surgery;  Laterality: Right;   EXCISION OF KELOID N/A 10/04/2019   Procedure: Excision Of Sternal Keloid;  Surgeon: Ivin Poot, MD;  Location: [facility];  Service: Open Heart Surgery;  Laterality: N/A;   LAPAROSCOPIC LYSIS INTESTINAL ADHESIONS  2012   LEFT HEART CATH AND CORONARY ANGIOGRAPHY N/A 09/23/2019   Procedure: LEFT HEART CATH AND CORONARY ANGIOGRAPHY;  Surgeon: Belva Crome, MD;  Location: [facility];  Service: Cardiovascular;  Laterality: N/A;   TEE WITHOUT CARDIOVERSION N/A 10/04/2019   Procedure: TRANSESOPHAGEAL ECHOCARDIOGRAM (TEE);  Surgeon: Prescott Gum, Collier Salina, MD;  Location: [facility];  Service: Open Heart Surgery;  Laterality: N/A;    Review of systems negative except as noted in HPI / PMHx or noted below:  Review of Systems  Constitutional: Negative.   HENT: Negative.    Eyes: Negative.   Respiratory: Negative.    Cardiovascular: Negative.   Gastrointestinal: Negative.   Genitourinary: Negative.   Musculoskeletal: Negative.   Skin: Negative.   Neurological: Negative.   Endo/Heme/Allergies: Negative.   Psychiatric/Behavioral: Negative.      Objective:   Vitals:   02/06/21 1040  BP: 126/82  Pulse: 60  Resp: 16  SpO2: 99%          Physical Exam Constitutional:      Appearance: He is not diaphoretic.  HENT:     Head: Normocephalic.     Right Ear: Tympanic membrane, ear canal and external ear normal.     Left Ear: Tympanic membrane, ear canal and external ear normal.     Nose: Nose normal. No mucosal edema or rhinorrhea.     Mouth/Throat:     Pharynx: Uvula midline. No oropharyngeal exudate.  Eyes:     Conjunctiva/sclera: Conjunctivae normal.  Neck:     Thyroid: No thyromegaly.     Trachea: Trachea normal. No tracheal tenderness or tracheal deviation.  Cardiovascular:     Rate and Rhythm: Normal rate and regular rhythm.     Heart sounds: Normal heart sounds, S1 normal and S2 normal. No murmur heard. Pulmonary:      Effort: No respiratory distress.     Breath sounds: Normal breath sounds. No stridor. No wheezing or rales.  Lymphadenopathy:     Head:     Right side of head: No tonsillar adenopathy.     Left side of head: No tonsillar adenopathy.     Cervical: No cervical adenopathy.  Skin:    Findings: No erythema or rash.     Nails: There is no clubbing.  Neurological:     Mental Status: He is alert.    Diagnostics:    Spirometry was performed and  demonstrated an FEV1 of 2.30 at 77 % of predicted.  Assessment and Plan:   1. COPD with asthma ([place])   2. Perennial allergic rhinitis   3. LPRD (laryngopharyngeal reflux disease)     1.  Continue to treat and prevent inflammation:   A.  Nasacort- 1-2 sprays each nostril 1 time per day  B.  Breztri - 2 inhalations 2 times per day  2. Continue to Treat and prevent reflux:   A. Famotidine 40mg  tablet daily  3. If needed:   A. Nasal saline  B. OTC antihistamine  C. Albuterol HFA - 2 inhalations every 4-6 hours  4.  Return to clinic in December 2022 or earlier if problem  Britian appears to be doing very well on his current plan and I will going to keep him on a combination of anti-inflammatory agents for his airway and therapy directed against reflux until he returns to this clinic in December 2022.  There may be an opportunity to consolidate some of his treatment if he continues to do as well as he has over the course of the past 2 months.  He will contact me during the interval should there be a problem.  Allena Katz, MD Allergy / Immunology [place]

## 2021-02-07 ENCOUNTER — Encounter: Payer: Self-pay | Admitting: Allergy and Immunology

## 2021-03-08 DIAGNOSIS — L03039 Cellulitis of unspecified toe: Secondary | ICD-10-CM | POA: Diagnosis not present

## 2021-03-08 DIAGNOSIS — Z6828 Body mass index (BMI) 28.0-28.9, adult: Secondary | ICD-10-CM | POA: Diagnosis not present

## 2021-04-05 DIAGNOSIS — Z01818 Encounter for other preprocedural examination: Secondary | ICD-10-CM | POA: Diagnosis not present

## 2021-04-05 DIAGNOSIS — H25813 Combined forms of age-related cataract, bilateral: Secondary | ICD-10-CM | POA: Diagnosis not present

## 2021-04-05 DIAGNOSIS — H25811 Combined forms of age-related cataract, right eye: Secondary | ICD-10-CM | POA: Diagnosis not present

## 2021-04-08 ENCOUNTER — Other Ambulatory Visit: Payer: Self-pay | Admitting: Cardiology

## 2021-04-08 ENCOUNTER — Encounter: Payer: Self-pay | Admitting: Allergy and Immunology

## 2021-04-08 ENCOUNTER — Ambulatory Visit (INDEPENDENT_AMBULATORY_CARE_PROVIDER_SITE_OTHER): Payer: Medicare Other | Admitting: Allergy and Immunology

## 2021-04-08 ENCOUNTER — Other Ambulatory Visit: Payer: Self-pay

## 2021-04-08 VITALS — BP 112/82 | HR 63 | Resp 16

## 2021-04-08 DIAGNOSIS — I25118 Atherosclerotic heart disease of native coronary artery with other forms of angina pectoris: Secondary | ICD-10-CM

## 2021-04-08 DIAGNOSIS — J449 Chronic obstructive pulmonary disease, unspecified: Secondary | ICD-10-CM

## 2021-04-08 DIAGNOSIS — K219 Gastro-esophageal reflux disease without esophagitis: Secondary | ICD-10-CM

## 2021-04-08 DIAGNOSIS — J3089 Other allergic rhinitis: Secondary | ICD-10-CM

## 2021-04-08 NOTE — Progress Notes (Signed)
[facility] [place] [facility] [place]   Follow-up Note  Referring Provider: Street, Sharon Mt, * Primary Provider: Street, Sharon Mt, MD Date of Office Visit: 04/08/2021  Subjective:   Steve Frye (DOB: 1945/11/15) is a 75 y.o. male who returns to the [facility] on 04/08/2021 in re-evaluation of the following:  HPI: [name] returns to this clinic in reevaluation of COPD/asthma overlap, rhinitis, and LPR.  His last visit to this clinic was 06 February 2021.  He has had an excellent interval of time since his last visit without the need for systemic steroid or antibiotic for any type of airway issue and rare use of a short acting bronchodilator and ability to exercise every day unimpeded.  He continues to use Breztri twice a day.  He has had no problems with his nose.  He continues to use a nasal steroid once a day.  He has had no problems with reflux or his throat.  He continues on famotidine once a day.  He has received 3 COVID vaccines, was infected with COVID in September 21, and has received this year's flu vaccine.  Allergies as of 04/08/2021       Reactions   Penicillins Hives   In his 20's        Medication List    ALIGN PO Take 1 tablet by mouth daily.   amiodarone 200 MG tablet Commonly known as: PACERONE Take 1 tablet (200 mg total) by mouth daily.   Breztri Aerosphere 160-9-4.8 MCG/ACT Aero Generic drug: Budeson-Glycopyrrol-Formoterol Inhale 2 puffs into the lungs in the morning and at bedtime.   Eliquis 5 MG Tabs tablet Generic drug: apixaban TAKE 1 TABLET BY MOUTH TWICE A DAY   famotidine 40 MG tablet Commonly known as: PEPCID Take 1 tablet (40 mg total) by mouth daily.   finasteride 5 MG tablet Commonly known as: PROSCAR Take 5 mg daily by mouth.   FRUIT & VEGETABLE DAILY PO Take 2 tablets daily by mouth.   metoprolol tartrate 25 MG tablet Commonly known as: LOPRESSOR TAKE 0.5 TABLETS BY MOUTH 2  TIMES DAILY.   nitroGLYCERIN 0.4 MG SL tablet Commonly known as: NITROSTAT Take 0.4 mg by mouth.   PRESERVISION AREDS 2 PO Take 1 tablet by mouth daily.   simvastatin 40 MG tablet Commonly known as: ZOCOR TAKE 1 TABLET BY MOUTH EVERY DAY   triamcinolone 55 MCG/ACT Aero nasal inhaler Commonly known as: NASACORT Using two sprays in each nostril every night as directed.   triamcinolone ointment 0.1 % Commonly known as: KENALOG Apply 1 application topically 3 (three) times daily as needed (Dry skin).   vitamin C 1000 MG tablet Take 1,000 mg by mouth daily.   VITAMIN D PO Take 2,000 mg by mouth daily.   Zinc 50 MG Tabs Take 50 mg by mouth daily.    Past Medical History:  Diagnosis Date   Acute coronary syndrome ([place]) 09/28/2019   Arthritis 01/15/2017   Atrial fibrillation (HCC)    Cancer (HCC)    skin cancer   Chest mass 12/04/2015   Coronary artery disease    Coronary artery disease of native artery of native heart with stable angina pectoris (HCC)    Cutaneous abscess of chest wall 12/04/2015   Dyspnea    Dysrhythmia    afib   Epidermoid cyst of skin 12/04/2015   GERD (gastroesophageal reflux disease) 01/15/2017   High risk medication use 01/10/2015   Overview:  Overview:  flecanide   Hyperlipidemia 01/15/2017   LAE (left atrial enlargement) 01/15/2017   Open wound of chest wall 04/01/2016   PAF (paroxysmal atrial fibrillation) ([place]) 01/10/2015   Overview:  CHADS2 vasc score=1   Postop check 11/03/2019   S/P CABG x 3 10/04/2019    Past Surgical History:  Procedure Laterality Date   APPENDECTOMY     CARDIAC CATHETERIZATION     09/23/19   CHOLECYSTECTOMY     CORONARY ARTERY BYPASS GRAFT N/A 10/04/2019   Procedure: CORONARY ARTERY BYPASS GRAFTING (CABG) times three using right greater saphenous vein and left internal mammary ;  Surgeon: Ivin Poot, MD;  Location: [facility];  Service: Open Heart Surgery;  Laterality: N/A;   CYST EXCISION     ENDOVEIN HARVEST OF GREATER  SAPHENOUS VEIN Right 10/04/2019   Procedure: [name] Of Greater Saphenous Vein;  Surgeon: Ivin Poot, MD;  Location: [facility];  Service: Open Heart Surgery;  Laterality: Right;   EXCISION OF KELOID N/A 10/04/2019   Procedure: Excision Of Sternal Keloid;  Surgeon: Ivin Poot, MD;  Location: [facility];  Service: Open Heart Surgery;  Laterality: N/A;   LAPAROSCOPIC LYSIS INTESTINAL ADHESIONS  2012   LEFT HEART CATH AND CORONARY ANGIOGRAPHY N/A 09/23/2019   Procedure: LEFT HEART CATH AND CORONARY ANGIOGRAPHY;  Surgeon: Belva Crome, MD;  Location: [facility];  Service: Cardiovascular;  Laterality: N/A;   TEE WITHOUT CARDIOVERSION N/A 10/04/2019   Procedure: TRANSESOPHAGEAL ECHOCARDIOGRAM (TEE);  Surgeon: Prescott Gum, Collier Salina, MD;  Location: [facility];  Service: Open Heart Surgery;  Laterality: N/A;    Review of systems negative except as noted in HPI / PMHx or noted below:  Review of Systems  Constitutional: Negative.   HENT: Negative.    Eyes: Negative.   Respiratory: Negative.    Cardiovascular: Negative.   Gastrointestinal: Negative.   Genitourinary: Negative.   Musculoskeletal: Negative.   Skin: Negative.   Neurological: Negative.   Endo/Heme/Allergies: Negative.   Psychiatric/Behavioral: Negative.      Objective:   Vitals:   04/08/21 1105  BP: 112/82  Pulse: 63  Resp: 16  SpO2: 99%          Physical Exam Constitutional:      Appearance: He is not diaphoretic.  HENT:     Head: Normocephalic.     Right Ear: Tympanic membrane, ear canal and external ear normal.     Left Ear: Tympanic membrane, ear canal and external ear normal.     Nose: Nose normal. No mucosal edema or rhinorrhea.     Mouth/Throat:     Pharynx: Uvula midline. No oropharyngeal exudate.  Eyes:     Conjunctiva/sclera: Conjunctivae normal.  Neck:     Thyroid: No thyromegaly.     Trachea: Trachea normal. No tracheal tenderness or tracheal deviation.  Cardiovascular:     Rate and Rhythm:  Normal rate and regular rhythm.     Heart sounds: Normal heart sounds, S1 normal and S2 normal. No murmur heard. Pulmonary:     Effort: No respiratory distress.     Breath sounds: Normal breath sounds. No stridor. No wheezing or rales.  Lymphadenopathy:     Head:     Right side of head: No tonsillar adenopathy.     Left side of head: No tonsillar adenopathy.     Cervical: No cervical adenopathy.  Skin:    Findings: No erythema or rash.     Nails: There is no clubbing.  Neurological:  Mental Status: He is alert.    Diagnostics:    Spirometry was performed and demonstrated an FEV1 of 2.35 at 79 % of predicted.  Assessment and Plan:   1. COPD with asthma ([place])   2. Perennial allergic rhinitis   3. LPRD (laryngopharyngeal reflux disease)     1.  Continue to treat and prevent inflammation:   A.  Nasacort- 1-2 sprays each nostril 1 time per day  B.  Breztri - 2 inhalations 2 times per day  2. Continue to Treat and prevent reflux:   A. Famotidine 40mg  tablet daily  3. If needed:   A. Nasal saline  B. OTC antihistamine  C. Albuterol HFA - 2 inhalations every 4-6 hours  4.  Return to clinic in 6 months or earlier if problem  Azekiel is really doing very well and he has a good understanding of his disease process and how his medications work and appropriate dosing of his medications depending on disease activity.  We will continue to have him use medications directed against respiratory tract inflammation and reflux as noted above and I will see him back in his clinic in 6 months or earlier if there is a problem.  Allena Katz, MD Allergy / Immunology [place]

## 2021-04-08 NOTE — Telephone Encounter (Signed)
Eliquis 5 mg refill request received. Patient is 75 years old, weight- 96.6 kg, Crea- 0.99 on 01/30/21, Diagnosis-PAF, and last seen by Dr. Bettina Gavia on 01/30/21. Dose is appropriate based on dosing criteria. Will send in refill to requested pharmacy.

## 2021-04-08 NOTE — Patient Instructions (Signed)
  1.  Continue to treat and prevent inflammation:   A.  Nasacort- 1-2 sprays each nostril 1 time per day  B.  Breztri - 2 inhalations 2 times per day  2. Continue to Treat and prevent reflux:   A. Famotidine 40mg  tablet daily  3. If needed:   A. Nasal saline  B. OTC antihistamine  C. Albuterol HFA - 2 inhalations every 4-6 hours  4.  Return to clinic in 6 months or earlier if problem

## 2021-04-09 ENCOUNTER — Encounter: Payer: Self-pay | Admitting: Allergy and Immunology

## 2021-04-09 DIAGNOSIS — H35371 Puckering of macula, right eye: Secondary | ICD-10-CM | POA: Diagnosis not present

## 2021-04-09 DIAGNOSIS — H25811 Combined forms of age-related cataract, right eye: Secondary | ICD-10-CM | POA: Diagnosis not present

## 2021-04-09 DIAGNOSIS — Z7901 Long term (current) use of anticoagulants: Secondary | ICD-10-CM | POA: Diagnosis not present

## 2021-04-09 DIAGNOSIS — H259 Unspecified age-related cataract: Secondary | ICD-10-CM | POA: Diagnosis not present

## 2021-04-09 DIAGNOSIS — Z87891 Personal history of nicotine dependence: Secondary | ICD-10-CM | POA: Diagnosis not present

## 2021-04-15 ENCOUNTER — Telehealth: Payer: Self-pay | Admitting: Allergy and Immunology

## 2021-04-15 NOTE — Telephone Encounter (Signed)
Patient states his pharmacy informed him that they do not have Breztri in stock and are not sure when they will be receiving more. Patient would like to know if there is an alternative he could take or what else he can do.

## 2021-04-17 NOTE — Telephone Encounter (Signed)
Called patient.  He was able to get the [facility] at [facility] the next day.

## 2021-04-24 DIAGNOSIS — R051 Acute cough: Secondary | ICD-10-CM | POA: Diagnosis not present

## 2021-04-24 DIAGNOSIS — Z20828 Contact with and (suspected) exposure to other viral communicable diseases: Secondary | ICD-10-CM | POA: Diagnosis not present

## 2021-05-02 DIAGNOSIS — H6121 Impacted cerumen, right ear: Secondary | ICD-10-CM | POA: Diagnosis not present

## 2021-05-02 DIAGNOSIS — Z6828 Body mass index (BMI) 28.0-28.9, adult: Secondary | ICD-10-CM | POA: Diagnosis not present

## 2021-05-07 DIAGNOSIS — H40012 Open angle with borderline findings, low risk, left eye: Secondary | ICD-10-CM | POA: Diagnosis not present

## 2021-05-07 DIAGNOSIS — H35363 Drusen (degenerative) of macula, bilateral: Secondary | ICD-10-CM | POA: Diagnosis not present

## 2021-05-07 DIAGNOSIS — H259 Unspecified age-related cataract: Secondary | ICD-10-CM | POA: Diagnosis not present

## 2021-05-07 DIAGNOSIS — H25812 Combined forms of age-related cataract, left eye: Secondary | ICD-10-CM | POA: Diagnosis not present

## 2021-05-23 IMAGING — CR DG CHEST 2V
2 series · 2 of 2 positions shown · non-contrast
Comparison: none

CLINICAL DATA: Preop testing

EXAM:
CHEST - 2 VIEW

[w chest pa]
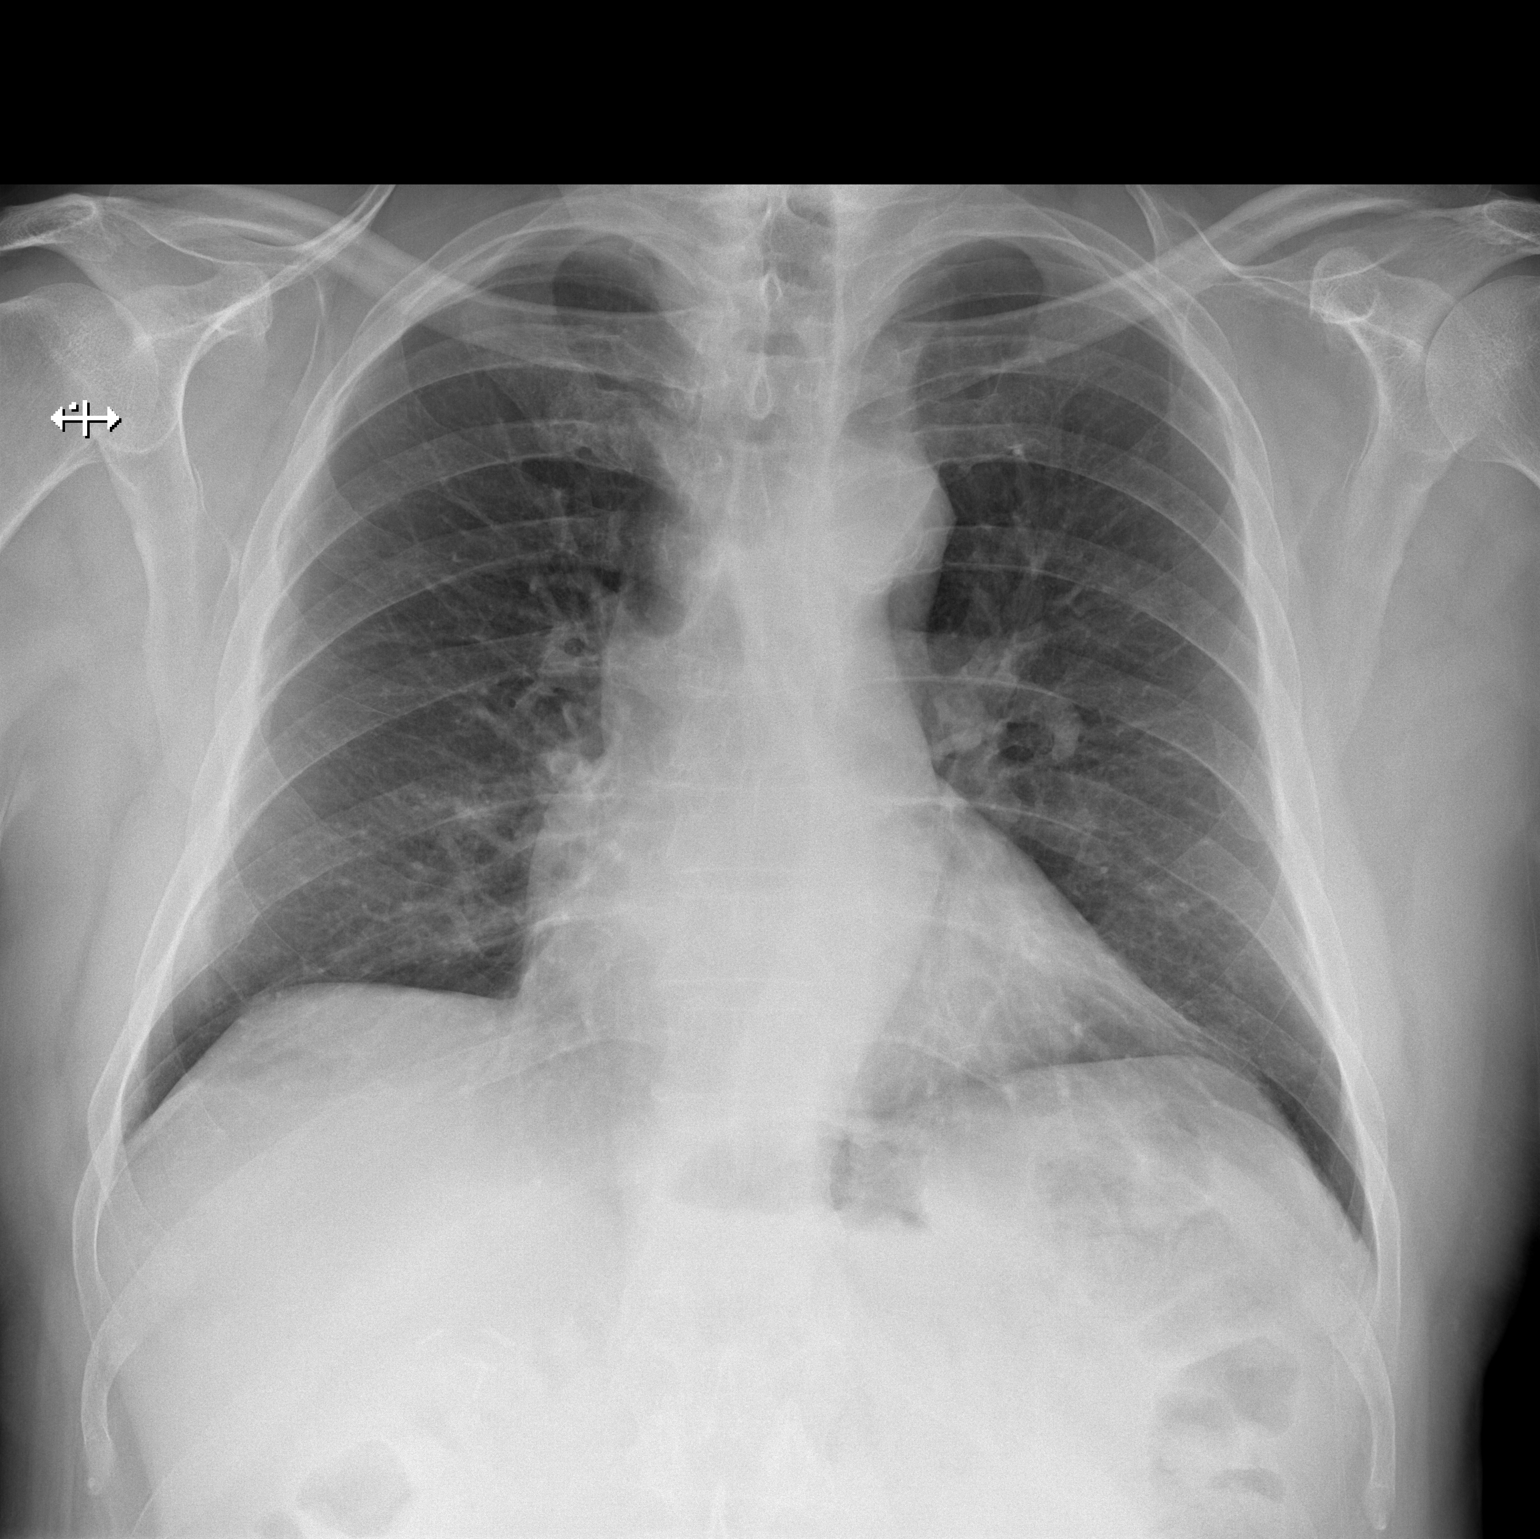

[w chest lat]
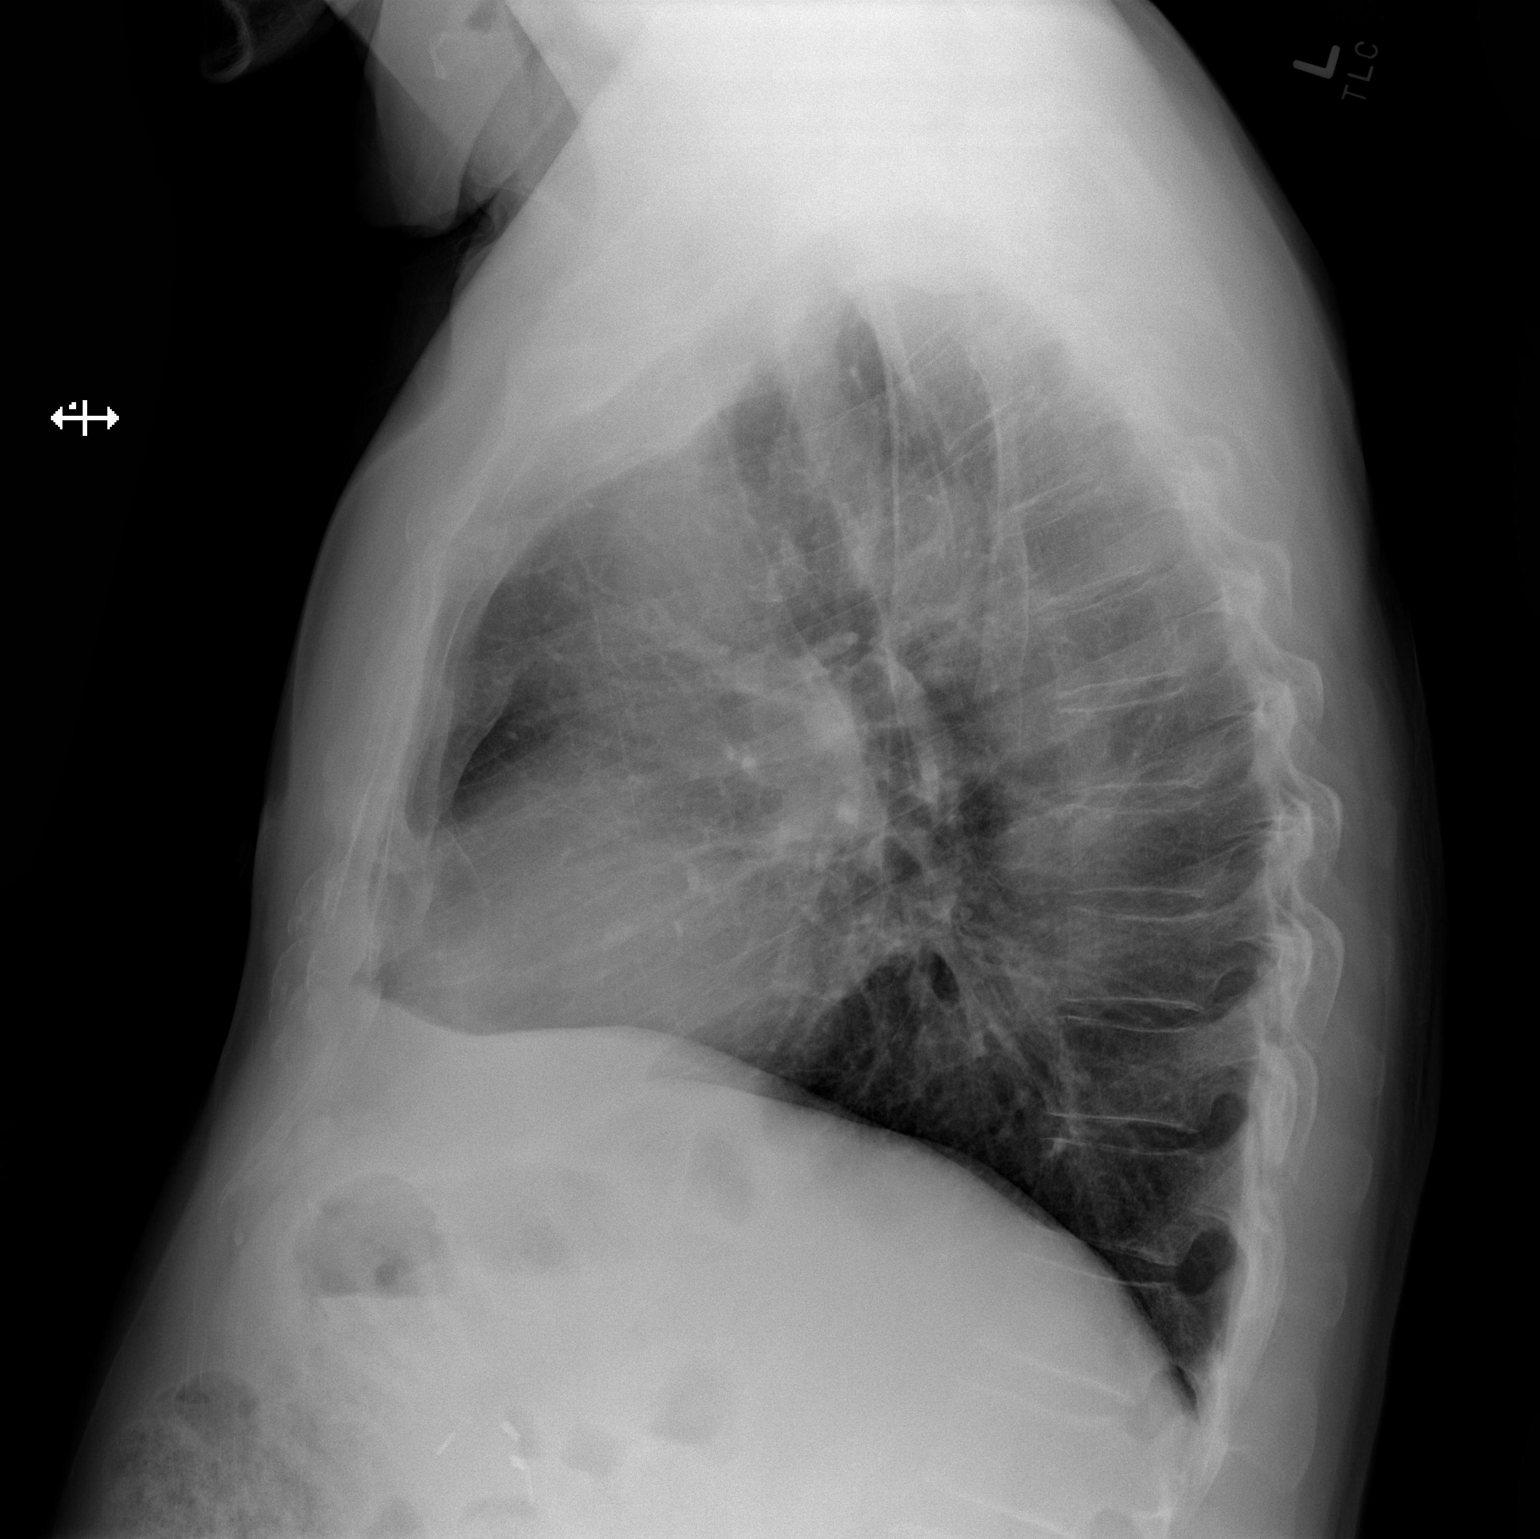

[2 of 2 positions shown; findings below may reference images not displayed]

FINDINGS: Mediastinum and hilar structures normal. Low lung volumes with mild
bibasilar atelectasis. No pleural effusion or pneumothorax. Heart
size normal. No acute bony abnormality surgical clips right upper
quadrant.
IMPRESSION: No acute cardiopulmonary disease.

## 2021-05-28 IMAGING — CR DG CHEST 1V PORT
1 series · 1 of 1 positions shown · non-contrast
Comparison: September 29, 2019

CLINICAL DATA: Status post CABG.

EXAM:
PORTABLE CHEST 1 VIEW

[AP]
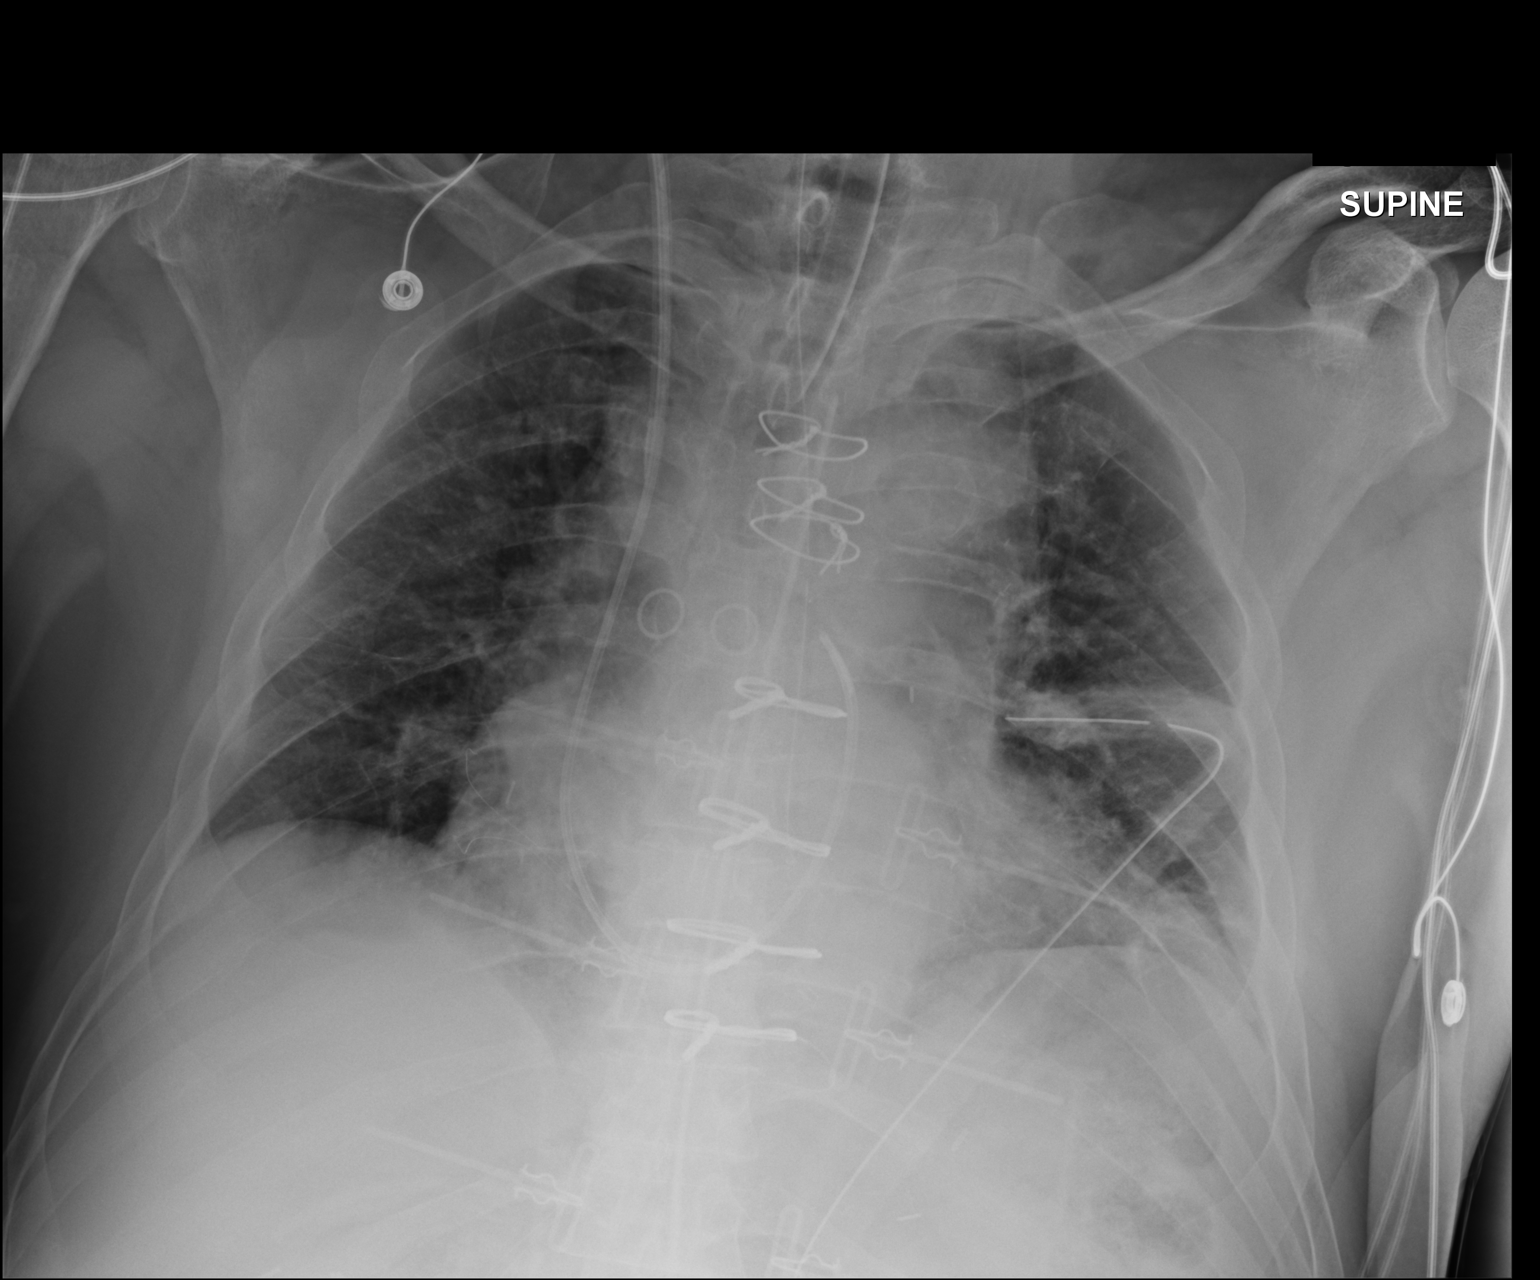

[1 of 1 positions shown; findings below may reference images not displayed]

FINDINGS: An endotracheal tube is seen with its distal tip approximately
cm from the carina. A right internal jugular Swan-Ganz catheter is
noted with its distal tip overlying the left hilum. There is a
single left-sided chest tube. Its distal tip is seen overlying the
perihilar region. Mild atelectasis is seen within the left lung
base. There is no evidence of a pleural effusion or pneumothorax.
The cardiac silhouette is mildly enlarged. The visualized skeletal
structures are unremarkable.
IMPRESSION: 1. Mild left basilar atelectasis.
2. Interval median sternotomy/CABG since the prior study dated [DATE]

## 2021-05-29 IMAGING — DX DG CHEST 1V PORT
1 series · 1 of 1 positions shown · non-contrast
Comparison: Single-view of the chest 10/04/2019.

CLINICAL DATA: Patient status post CABG 10/04/2019.

EXAM:
PORTABLE CHEST 1 VIEW

[chest]
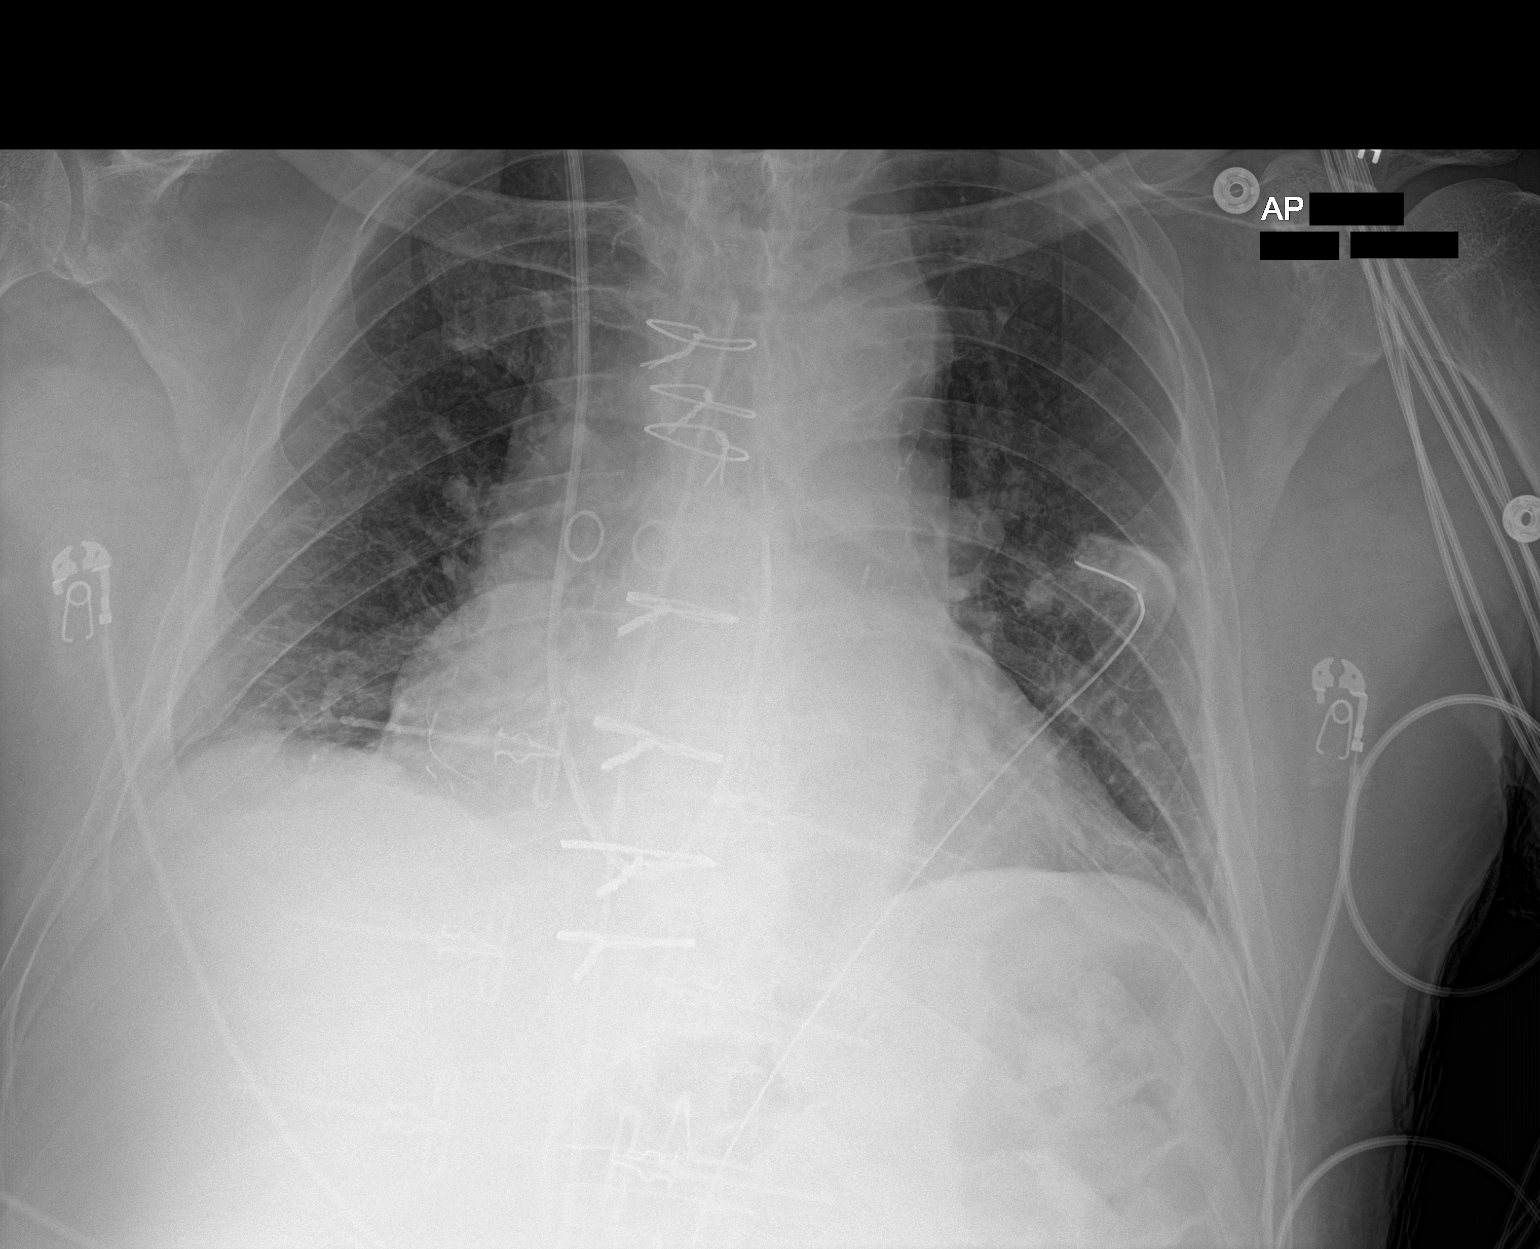

[1 of 1 positions shown; findings below may reference images not displayed]

FINDINGS: Endotracheal tube and NG tube have been removed. Swan-Ganz catheter
tip is in the distal most pulmonary outflow tract. Left chest tube
and mediastinal drain noted. Mild bibasilar atelectasis. No
pneumothorax or pleural effusion. Cardiomegaly without edema.
IMPRESSION: Support tubes and lines as described. Negative for pneumothorax for
acute disease.

## 2021-05-31 IMAGING — DX DG CHEST 2V
2 series · 2 of 2 positions shown · non-contrast
Comparison: 10/06/2019

CLINICAL DATA: Status post CABG on 10/04/2019.

EXAM:
CHEST - 2 VIEW

[chest pa]
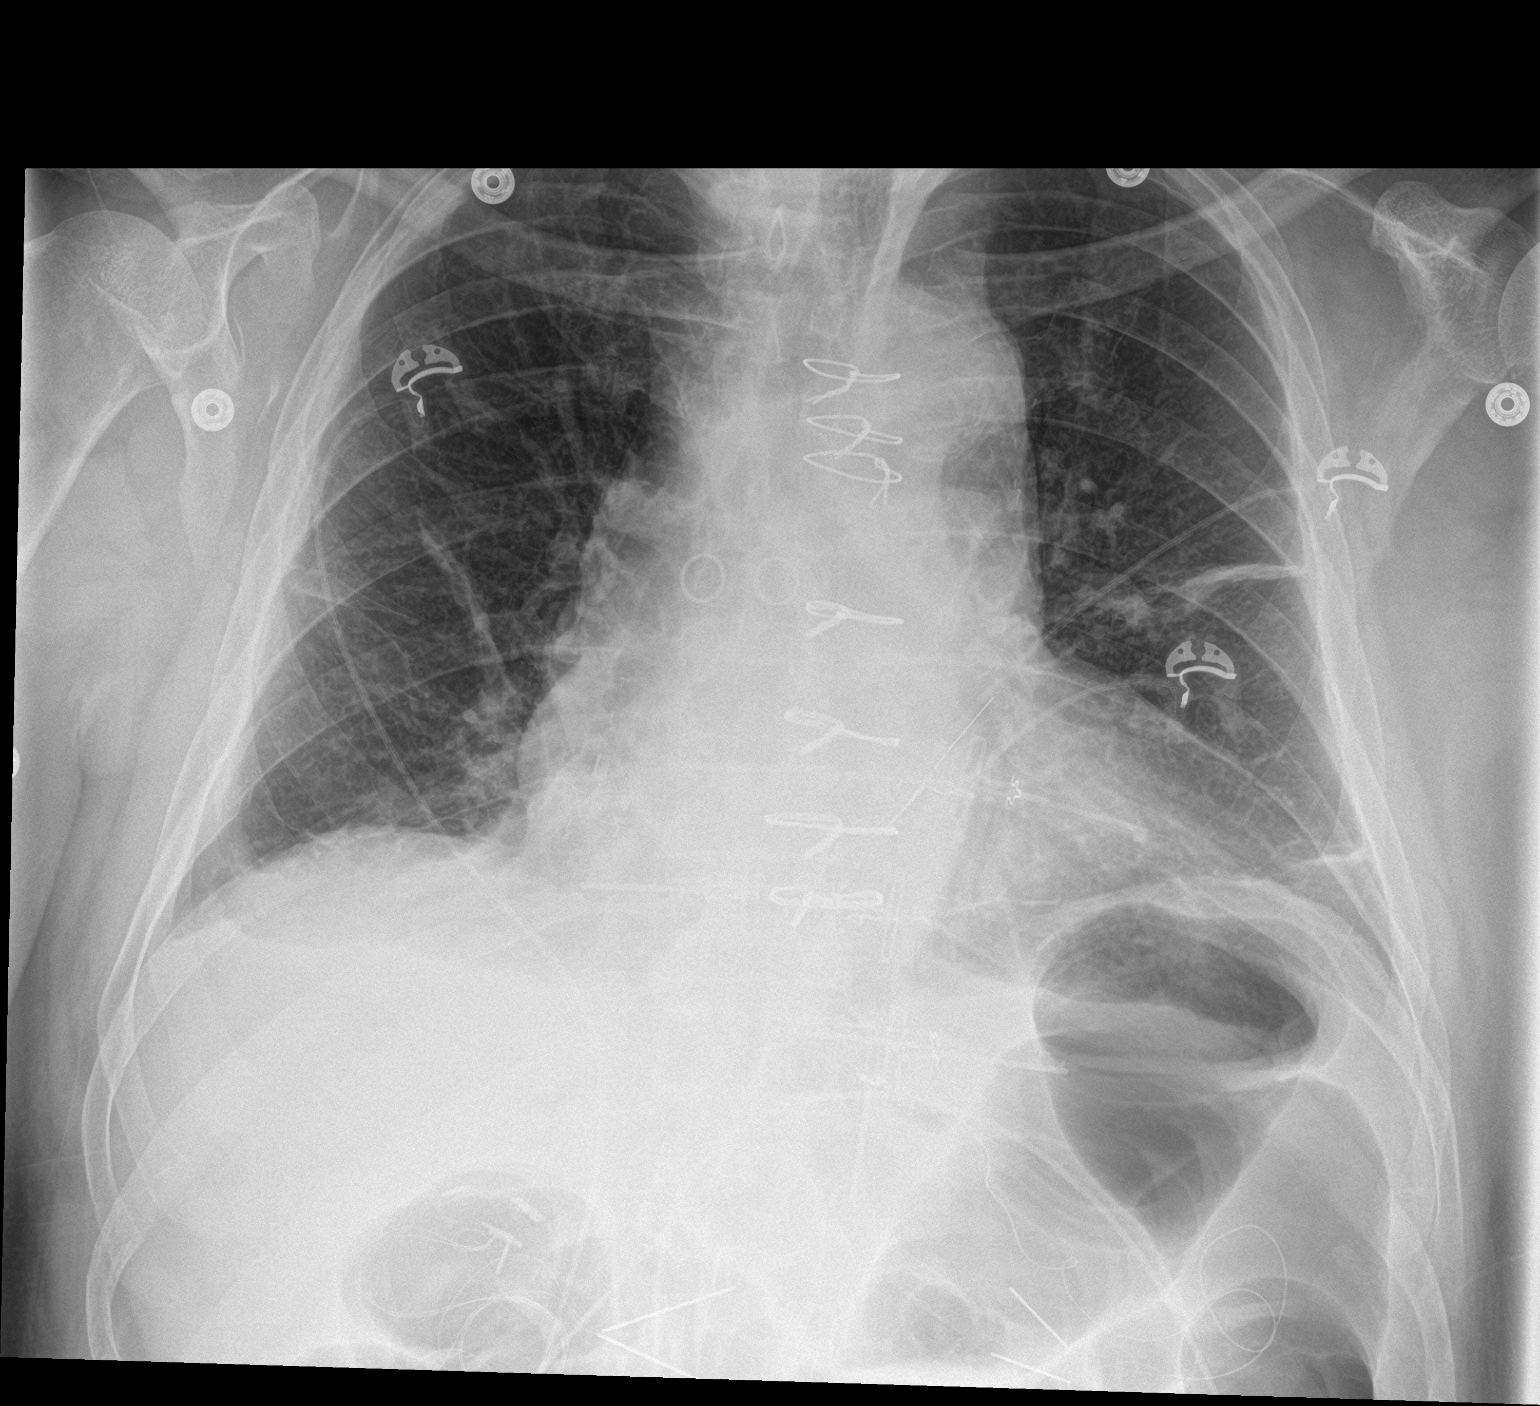

[chest lat]
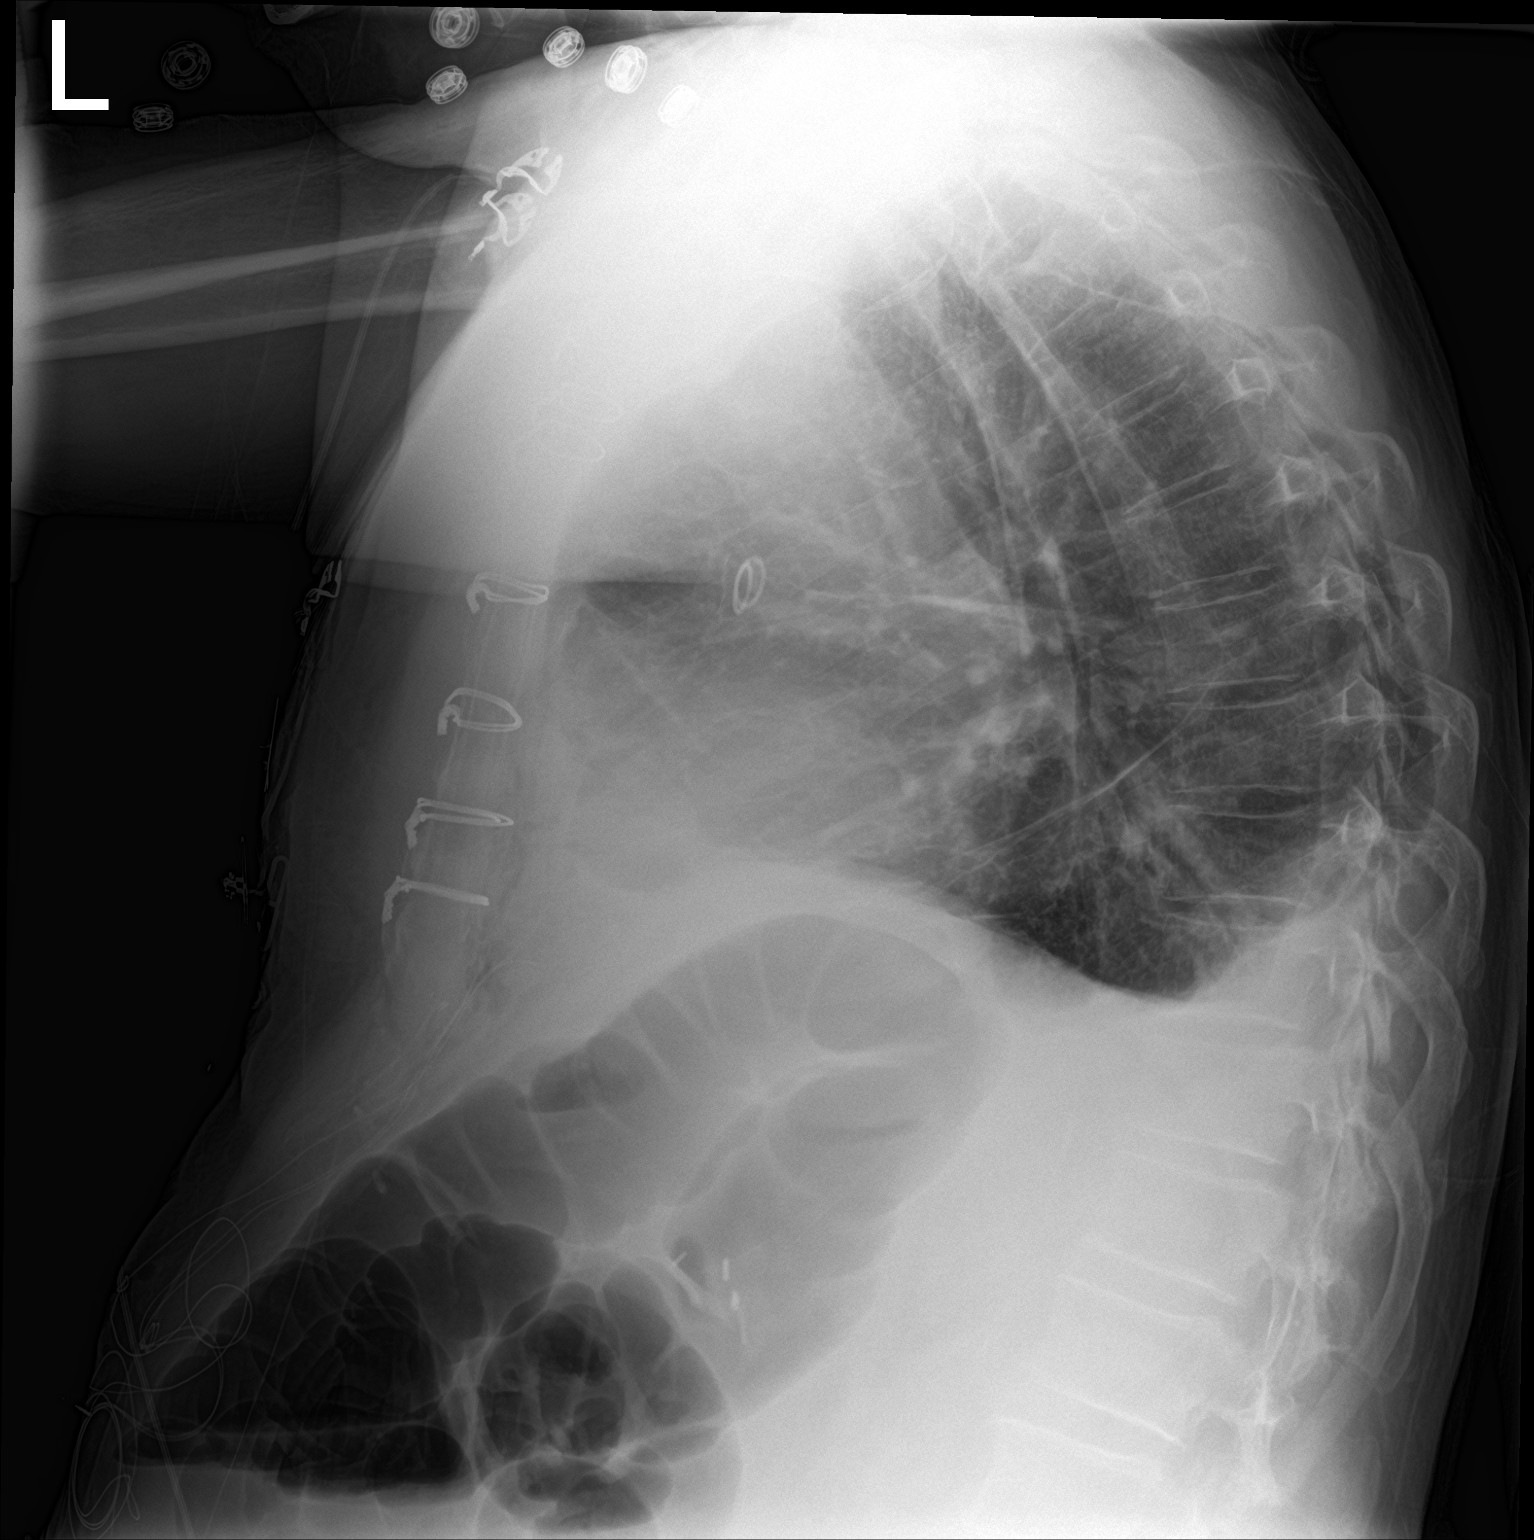

[2 of 2 positions shown; findings below may reference images not displayed]

FINDINGS: Stable enlarged cardiac silhouette and post CABG changes. Interval
removal of left chest tube with no pneumothorax seen. Mild linear
atelectasis in both mid and lower lung zones. Small bilateral
pleural effusions. Unremarkable bones.
IMPRESSION: 1. Small bilateral pleural effusions.
2. Mild bilateral atelectasis.
3. Stable cardiomegaly.

## 2021-06-06 DIAGNOSIS — L720 Epidermal cyst: Secondary | ICD-10-CM | POA: Diagnosis not present

## 2021-06-20 DIAGNOSIS — I25119 Atherosclerotic heart disease of native coronary artery with unspecified angina pectoris: Secondary | ICD-10-CM | POA: Diagnosis not present

## 2021-06-20 DIAGNOSIS — Z6828 Body mass index (BMI) 28.0-28.9, adult: Secondary | ICD-10-CM | POA: Diagnosis not present

## 2021-06-20 DIAGNOSIS — I4891 Unspecified atrial fibrillation: Secondary | ICD-10-CM | POA: Diagnosis not present

## 2021-06-20 DIAGNOSIS — J441 Chronic obstructive pulmonary disease with (acute) exacerbation: Secondary | ICD-10-CM | POA: Diagnosis not present

## 2021-07-04 DIAGNOSIS — Z6828 Body mass index (BMI) 28.0-28.9, adult: Secondary | ICD-10-CM | POA: Diagnosis not present

## 2021-07-04 DIAGNOSIS — J441 Chronic obstructive pulmonary disease with (acute) exacerbation: Secondary | ICD-10-CM | POA: Diagnosis not present

## 2021-07-04 DIAGNOSIS — Z1331 Encounter for screening for depression: Secondary | ICD-10-CM | POA: Diagnosis not present

## 2021-07-25 ENCOUNTER — Other Ambulatory Visit: Payer: Self-pay | Admitting: Allergy and Immunology

## 2021-07-25 ENCOUNTER — Other Ambulatory Visit: Payer: Self-pay | Admitting: Cardiology

## 2021-07-25 DIAGNOSIS — R7301 Impaired fasting glucose: Secondary | ICD-10-CM | POA: Diagnosis not present

## 2021-07-25 DIAGNOSIS — E782 Mixed hyperlipidemia: Secondary | ICD-10-CM | POA: Diagnosis not present

## 2021-07-25 DIAGNOSIS — N138 Other obstructive and reflux uropathy: Secondary | ICD-10-CM | POA: Diagnosis not present

## 2021-07-25 DIAGNOSIS — M1A0710 Idiopathic chronic gout, right ankle and foot, without tophus (tophi): Secondary | ICD-10-CM | POA: Diagnosis not present

## 2021-07-25 DIAGNOSIS — Z951 Presence of aortocoronary bypass graft: Secondary | ICD-10-CM | POA: Diagnosis not present

## 2021-07-25 DIAGNOSIS — Z0000 Encounter for general adult medical examination without abnormal findings: Secondary | ICD-10-CM | POA: Diagnosis not present

## 2021-07-25 DIAGNOSIS — I25119 Atherosclerotic heart disease of native coronary artery with unspecified angina pectoris: Secondary | ICD-10-CM | POA: Diagnosis not present

## 2021-07-25 DIAGNOSIS — Z79899 Other long term (current) drug therapy: Secondary | ICD-10-CM | POA: Diagnosis not present

## 2021-07-25 DIAGNOSIS — D6869 Other thrombophilia: Secondary | ICD-10-CM | POA: Diagnosis not present

## 2021-07-25 DIAGNOSIS — I4891 Unspecified atrial fibrillation: Secondary | ICD-10-CM | POA: Diagnosis not present

## 2021-07-25 DIAGNOSIS — N401 Enlarged prostate with lower urinary tract symptoms: Secondary | ICD-10-CM | POA: Diagnosis not present

## 2021-07-30 ENCOUNTER — Other Ambulatory Visit: Payer: Self-pay

## 2021-07-30 MED ORDER — AMIODARONE HCL 200 MG PO TABS: 200.0000 mg | ORAL_TABLET | Freq: Every day | ORAL | 1 refills | Status: DC

## 2021-08-14 DIAGNOSIS — D0462 Carcinoma in situ of skin of left upper limb, including shoulder: Secondary | ICD-10-CM | POA: Diagnosis not present

## 2021-08-14 DIAGNOSIS — L910 Hypertrophic scar: Secondary | ICD-10-CM | POA: Diagnosis not present

## 2021-08-14 DIAGNOSIS — L570 Actinic keratosis: Secondary | ICD-10-CM | POA: Diagnosis not present

## 2021-08-20 DIAGNOSIS — C44629 Squamous cell carcinoma of skin of left upper limb, including shoulder: Secondary | ICD-10-CM | POA: Diagnosis not present

## 2021-10-07 ENCOUNTER — Ambulatory Visit (INDEPENDENT_AMBULATORY_CARE_PROVIDER_SITE_OTHER): Payer: Medicare Other | Admitting: Allergy and Immunology

## 2021-10-07 ENCOUNTER — Encounter: Payer: Self-pay | Admitting: Allergy and Immunology

## 2021-10-07 VITALS — BP 110/66 | HR 56 | Resp 16 | Ht 70.0 in | Wt 220.0 lb

## 2021-10-07 DIAGNOSIS — R635 Abnormal weight gain: Secondary | ICD-10-CM

## 2021-10-07 DIAGNOSIS — J449 Chronic obstructive pulmonary disease, unspecified: Secondary | ICD-10-CM

## 2021-10-07 DIAGNOSIS — K219 Gastro-esophageal reflux disease without esophagitis: Secondary | ICD-10-CM

## 2021-10-07 DIAGNOSIS — J3089 Other allergic rhinitis: Secondary | ICD-10-CM

## 2021-10-07 NOTE — Patient Instructions (Addendum)
  1.  Continue to treat and prevent inflammation:   A.  Nasacort- 1-2 sprays each nostril 1 time per day  B.  Breztri - 2 inhalations 2 times per day  2. Continue to Treat and prevent reflux:   A. Famotidine '40mg'$  tablet daily  3. If needed:   A. Nasal saline  B. OTC antihistamine  C. Albuterol HFA - 2 inhalations every 4-6 hours  4.  Return to clinic in 6 months or earlier if problem  5. Slow and consistent weight loss - 20 lbs in 6 months

## 2021-10-07 NOTE — Progress Notes (Signed)
[place] [facility] [place] [facility] [place]   Follow-up Note  Referring Provider: Street, Sharon Mt, * Primary Provider: Street, Sharon Mt, MD Date of Office Visit: 10/07/2021  Subjective:   Steve Frye (DOB: 1945/07/15) is a 76 y.o. male who returns to the [facility] on 10/07/2021 in re-evaluation of the following:  HPI: Derryck returns to this clinic in evaluation of COPD/asthma overlap, rhinitis and LPR.  His last visit to this clinic was 08 April 2021.  He has really done well since his last visit although apparently sometime during the early spring he had contracted some type of viral respiratory tract infection and visited with his primary care doctor and received an antibiotic.  Fortunately, that issue was short-lived.  Otherwise, he rarely uses a short acting bronchodilator and he can exert himself without any difficulty while he continues on a triple inhaler twice a day.  And he has had very little problems with his nose while using a nasal steroid.  During the spring he did need to add in some antihistamine.  His reflux and throat are doing pretty good on famotidine.  Allergies as of 10/07/2021       Reactions   Penicillins Hives   In his 20's        Medication List    ALIGN PO Take 1 tablet by mouth daily.   amiodarone 200 MG tablet Commonly known as: PACERONE Take 1 tablet (200 mg total) by mouth daily.   atorvastatin 40 MG tablet Commonly known as: LIPITOR Take 40 mg by mouth at bedtime.   Breztri Aerosphere 160-9-4.8 MCG/ACT Aero Generic drug: Budeson-Glycopyrrol-Formoterol INHALE 2 PUFFS INTO THE LUNGS IN THE MORNING AND AT BEDTIME.   Eliquis 5 MG Tabs tablet Generic drug: apixaban TAKE 1 TABLET BY MOUTH TWICE A DAY   famotidine 40 MG tablet Commonly known as: PEPCID TAKE 1 TABLET BY MOUTH EVERY DAY   finasteride 5 MG tablet Commonly known as: PROSCAR Take 5 mg daily by mouth.   metoprolol tartrate 25 MG  tablet Commonly known as: LOPRESSOR TAKE 0.5 TABLETS BY MOUTH 2 TIMES DAILY.   PRESERVISION AREDS 2 PO Take 1 tablet by mouth daily.   triamcinolone 55 MCG/ACT Aero nasal inhaler Commonly known as: NASACORT Using two sprays in each nostril every night as directed.   triamcinolone ointment 0.1 % Commonly known as: KENALOG Apply 1 application topically 3 (three) times daily as needed (Dry skin).   Ventolin HFA 108 (90 Base) MCG/ACT inhaler Generic drug: albuterol SMARTSIG:2 Puff(s) By Mouth   vitamin C 1000 MG tablet Take 1,000 mg by mouth daily.   VITAMIN D3 PO Take by mouth.    Past Medical History:  Diagnosis Date   Acute coronary syndrome ([place]) 09/28/2019   Arthritis 01/15/2017   Atrial fibrillation (HCC)    Cancer (HCC)    skin cancer   Chest mass 12/04/2015   Coronary artery disease    Coronary artery disease of native artery of native heart with stable angina pectoris (HCC)    Cutaneous abscess of chest wall 12/04/2015   Dyspnea    Dysrhythmia    afib   Epidermoid cyst of skin 12/04/2015   GERD (gastroesophageal reflux disease) 01/15/2017   High risk medication use 01/10/2015   Overview:  Overview:  flecanide   Hyperlipidemia 01/15/2017   LAE (left atrial enlargement) 01/15/2017   Open wound of chest wall 04/01/2016   PAF (paroxysmal atrial fibrillation) ([place]) 01/10/2015   Overview:  CHADS2 vasc score=1   Postop check 11/03/2019   S/P CABG x 3 10/04/2019    Past Surgical History:  Procedure Laterality Date   APPENDECTOMY     CARDIAC CATHETERIZATION     09/23/19   CHOLECYSTECTOMY     CORONARY ARTERY BYPASS GRAFT N/A 10/04/2019   Procedure: CORONARY ARTERY BYPASS GRAFTING (CABG) times three using right greater saphenous vein and left internal mammary ;  Surgeon: Ivin Poot, MD;  Location: [facility];  Service: Open Heart Surgery;  Laterality: N/A;   CYST EXCISION     ENDOVEIN HARVEST OF GREATER SAPHENOUS VEIN Right 10/04/2019   Procedure: [name] Of Greater  Saphenous Vein;  Surgeon: Ivin Poot, MD;  Location: [facility];  Service: Open Heart Surgery;  Laterality: Right;   EXCISION OF KELOID N/A 10/04/2019   Procedure: Excision Of Sternal Keloid;  Surgeon: Ivin Poot, MD;  Location: [place] [facility];  Service: Open Heart Surgery;  Laterality: N/A;   LAPAROSCOPIC LYSIS INTESTINAL ADHESIONS  2012   LEFT HEART CATH AND CORONARY ANGIOGRAPHY N/A 09/23/2019   Procedure: LEFT HEART CATH AND CORONARY ANGIOGRAPHY;  Surgeon: Belva Crome, MD;  Location: [facility];  Service: Cardiovascular;  Laterality: N/A;   TEE WITHOUT CARDIOVERSION N/A 10/04/2019   Procedure: TRANSESOPHAGEAL ECHOCARDIOGRAM (TEE);  Surgeon: Prescott Gum, Collier Salina, MD;  Location: [facility];  Service: Open Heart Surgery;  Laterality: N/A;    Review of systems negative except as noted in HPI / PMHx or noted below:  Review of Systems  Constitutional: Negative.   HENT: Negative.    Eyes: Negative.   Respiratory: Negative.    Cardiovascular: Negative.   Gastrointestinal: Negative.   Genitourinary: Negative.   Musculoskeletal: Negative.   Skin: Negative.   Neurological: Negative.   Endo/Heme/Allergies: Negative.   Psychiatric/Behavioral: Negative.       Objective:   Vitals:   10/07/21 1105 10/07/21 1118  BP: 140/76 110/66  Pulse: (!) 56   Resp: 16   SpO2: 97%    Height: '5\' 10"'$  (177.8 cm)  Weight: 220 lb (99.8 kg)   Physical Exam Constitutional:      Appearance: He is not diaphoretic.  HENT:     Head: Normocephalic.     Right Ear: Tympanic membrane, ear canal and external ear normal.     Left Ear: Tympanic membrane, ear canal and external ear normal.     Nose: Nose normal. No mucosal edema or rhinorrhea.     Mouth/Throat:     Pharynx: Uvula midline. No oropharyngeal exudate.  Eyes:     Conjunctiva/sclera: Conjunctivae normal.  Neck:     Thyroid: No thyromegaly.     Trachea: Trachea normal. No tracheal tenderness or tracheal deviation.  Cardiovascular:     Rate and Rhythm:  Normal rate and regular rhythm.     Heart sounds: Normal heart sounds, S1 normal and S2 normal. No murmur heard. Pulmonary:     Effort: No respiratory distress.     Breath sounds: Normal breath sounds. No stridor. No wheezing or rales.  Lymphadenopathy:     Head:     Right side of head: No tonsillar adenopathy.     Left side of head: No tonsillar adenopathy.     Cervical: No cervical adenopathy.  Skin:    Findings: No erythema or rash.     Nails: There is no clubbing.  Neurological:     Mental Status: He is alert.     Diagnostics:    Spirometry was  performed and demonstrated an FEV1 of 2.39 at 80 % of predicted.  Assessment and Plan:   1. COPD with asthma ([place])   2. Perennial allergic rhinitis   3. LPRD (laryngopharyngeal reflux disease)   4. Weight gain     1.  Continue to treat and prevent inflammation:   A.  Nasacort- 1-2 sprays each nostril 1 time per day  B.  Breztri - 2 inhalations 2 times per day  2. Continue to Treat and prevent reflux:   A. Famotidine '40mg'$  tablet daily  3. If needed:   A. Nasal saline  B. OTC antihistamine  C. Albuterol HFA - 2 inhalations every 4-6 hours  4.  Return to clinic in 6 months or earlier if problem  5. Slow and consistent weight loss - 20 lbs in 6 months  Desjuan overall appears to be doing pretty good on his current plan of anti-inflammatory agents for his airway and therapy directed against reflux.  At this point he will remain on this plan and we will see him back in this clinic in 6 months.  He does appear to be somewhat overweight and we did have a talk today about the need to undergo a weight loss program and I recommended that he aim for 20 pounds over the course the next 6 months.  Allena Katz, MD Allergy / Immunology [place]

## 2021-10-08 ENCOUNTER — Encounter: Payer: Self-pay | Admitting: Allergy and Immunology

## 2021-10-25 ENCOUNTER — Other Ambulatory Visit: Payer: Self-pay | Admitting: Cardiology

## 2021-10-25 NOTE — Telephone Encounter (Signed)
Prescription refill request for Eliquis received. Indication:Afib Last office visit:10/22 Scr:0.9 Age: 76 Weight:99.8 kg  Prescription refilled

## 2021-10-28 DIAGNOSIS — Z7901 Long term (current) use of anticoagulants: Secondary | ICD-10-CM | POA: Diagnosis not present

## 2021-10-28 DIAGNOSIS — J449 Chronic obstructive pulmonary disease, unspecified: Secondary | ICD-10-CM | POA: Diagnosis not present

## 2021-10-28 DIAGNOSIS — K648 Other hemorrhoids: Secondary | ICD-10-CM | POA: Diagnosis not present

## 2021-11-05 ENCOUNTER — Telehealth: Payer: Self-pay | Admitting: Cardiology

## 2021-11-05 NOTE — Telephone Encounter (Signed)
   Pre-operative Risk Assessment    Patient Name: Steve Frye  DOB: [birth year] MRN: [id number]      Request for Surgical Clearance    Procedure:   Colonoscopy  Date of Surgery:  Clearance 12/05/21                                 Surgeon:  Dr. Christia Reading [place] Surgeon's Group or Practice Name:  [facility] Phone number:  [phone] Fax number:  [phone]   Type of Clearance Requested:   - Pharmacy:  Hold Apixaban (Eliquis)     Type of Anesthesia:  Not Indicated   Additional requests/questions:   NA  Gaylyn Lambert   11/05/2021, 10:19 AM

## 2021-11-06 DIAGNOSIS — C44622 Squamous cell carcinoma of skin of right upper limb, including shoulder: Secondary | ICD-10-CM | POA: Diagnosis not present

## 2021-11-06 DIAGNOSIS — L02511 Cutaneous abscess of right hand: Secondary | ICD-10-CM | POA: Diagnosis not present

## 2021-11-06 DIAGNOSIS — L570 Actinic keratosis: Secondary | ICD-10-CM | POA: Diagnosis not present

## 2021-11-06 DIAGNOSIS — L0100 Impetigo, unspecified: Secondary | ICD-10-CM | POA: Diagnosis not present

## 2021-11-06 NOTE — Telephone Encounter (Signed)
Patient with diagnosis of afib on Eliquis for anticoagulation.    Procedure: colonoscopy Date of procedure: 12/05/21   CHA2DS2-VASc Score = 3   This indicates a 3.2% annual risk of stroke. The patient's score is based upon: CHF History: 0 HTN History: 0 Diabetes History: 0 Stroke History: 0 Vascular Disease History: 1 Age Score: 2 Gender Score: 0      CrCl 82 ml/min  Per office protocol, patient can hold Eliquis for 1-2 days prior to procedure.     **This guidance is not considered finalized until pre-operative APP has relayed final recommendations.**

## 2021-11-06 NOTE — Telephone Encounter (Signed)
Primary Cardiologist:Brian Bettina Gavia, MD   Preoperative team, please contact this patient and set up a phone call appointment for further preoperative risk assessment. Please obtain consent and complete medication review. Thank you for your help.   I confirm that guidance regarding antiplatelet and oral anticoagulation therapy has been completed and, if necessary, noted below.   Emmaline Life, NP-C    11/06/2021, 1:46 PM [place] [address] Office [phone] Fax [phone]

## 2021-11-08 ENCOUNTER — Telehealth: Payer: Self-pay

## 2021-11-08 NOTE — Telephone Encounter (Signed)
  Patient Consent for Virtual Visit        Steve Frye has provided verbal consent on 11/08/2021 for a virtual visit (video or telephone).   CONSENT FOR VIRTUAL VISIT FOR:  Steve Frye  By participating in this virtual visit I agree to the following:  I hereby voluntarily request, consent and authorize [facility] and its employed or contracted physicians, physician assistants, nurse practitioners or other licensed health care professionals (the Practitioner), to provide me with telemedicine health care services (the "Services") as deemed necessary by the treating Practitioner. I acknowledge and consent to receive the Services by the Practitioner via telemedicine. I understand that the telemedicine visit will involve communicating with the Practitioner through live audiovisual communication technology and the disclosure of certain medical information by electronic transmission. I acknowledge that I have been given the opportunity to request an in-person assessment or other available alternative prior to the telemedicine visit and am voluntarily participating in the telemedicine visit.  I understand that I have the right to withhold or withdraw my consent to the use of telemedicine in the course of my care at any time, without affecting my right to future care or treatment, and that the Practitioner or I may terminate the telemedicine visit at any time. I understand that I have the right to inspect all information obtained and/or recorded in the course of the telemedicine visit and may receive copies of available information for a reasonable fee.  I understand that some of the potential risks of receiving the Services via telemedicine include:  Delay or interruption in medical evaluation due to technological equipment failure or disruption; Information transmitted may not be sufficient (e.g. poor resolution of images) to allow for appropriate medical decision making by the Practitioner; and/or  In  rare instances, security protocols could fail, causing a breach of personal health information.  Furthermore, I acknowledge that it is my responsibility to provide information about my medical history, conditions and care that is complete and accurate to the best of my ability. I acknowledge that Practitioner's advice, recommendations, and/or decision may be based on factors not within their control, such as incomplete or inaccurate data provided by me or distortions of diagnostic images or specimens that may result from electronic transmissions. I understand that the practice of medicine is not an exact science and that Practitioner makes no warranties or guarantees regarding treatment outcomes. I acknowledge that a copy of this consent can be made available to me via my patient portal ([name]), or I can request a printed copy by calling the office of [facility].    I understand that my insurance will be billed for this visit.   I have read or had this consent read to me. I understand the contents of this consent, which adequately explains the benefits and risks of the Services being provided via telemedicine.  I have been provided ample opportunity to ask questions regarding this consent and the Services and have had my questions answered to my satisfaction. I give my informed consent for the services to be provided through the use of telemedicine in my medical care

## 2021-11-08 NOTE — Telephone Encounter (Signed)
Pt is scheduled for a TELE visit on 11/14/2021.

## 2021-11-14 ENCOUNTER — Ambulatory Visit (INDEPENDENT_AMBULATORY_CARE_PROVIDER_SITE_OTHER): Payer: Medicare Other | Admitting: Nurse Practitioner

## 2021-11-14 ENCOUNTER — Encounter: Payer: Self-pay | Admitting: Nurse Practitioner

## 2021-11-14 DIAGNOSIS — Z01810 Encounter for preprocedural cardiovascular examination: Secondary | ICD-10-CM

## 2021-11-14 NOTE — Progress Notes (Signed)
Virtual Visit via Telephone Note   Because of Steve Frye's co-morbid illnesses, he is at least at moderate risk for complications without adequate follow up.  This format is felt to be most appropriate for this patient at this time.  The patient did not have access to video technology/had technical difficulties with video requiring transitioning to audio format only (telephone).  All issues noted in this document were discussed and addressed.  No physical exam could be performed with this format.  Please refer to the patient's chart for his consent to telehealth for [facility].  Evaluation Performed:  Preoperative cardiovascular risk assessment _____________   Date:  11/14/2021   Patient ID:  Steve Frye, DOB 1945/10/31, MRN [id number] Patient Location:  Home Provider location:   Office  Primary Care Provider:  Street, Sharon Mt, MD Primary Cardiologist:  Shirlee More, MD  Chief Complaint / Patient Profile   76 y.o. y/o male with a h/o PAF on chronic anticoagulation and chronic antiarrhythmic medication, CAD s/p CABG 2021, normal nuclear stress test 07/2020, hyperlipidemia, COPD  who is pending colonoscopy and presents today for telephonic preoperative cardiovascular risk assessment.  Past Medical History    Past Medical History:  Diagnosis Date   Acute coronary syndrome ([place]) 09/28/2019   Arthritis 01/15/2017   Atrial fibrillation (HCC)    Cancer (HCC)    skin cancer   Chest mass 12/04/2015   Coronary artery disease    Coronary artery disease of native artery of native heart with stable angina pectoris ([place])    Cutaneous abscess of chest wall 12/04/2015   Dyspnea    Dysrhythmia    afib   Epidermoid cyst of skin 12/04/2015   GERD (gastroesophageal reflux disease) 01/15/2017   High risk medication use 01/10/2015   Overview:  Overview:  flecanide   Hyperlipidemia 01/15/2017   LAE (left atrial enlargement) 01/15/2017   Open wound of chest wall 04/01/2016   PAF (paroxysmal atrial  fibrillation) ([place]) 01/10/2015   Overview:  CHADS2 vasc score=1   Postop check 11/03/2019   S/P CABG x 3 10/04/2019   Past Surgical History:  Procedure Laterality Date   APPENDECTOMY     CARDIAC CATHETERIZATION     09/23/19   CHOLECYSTECTOMY     CORONARY ARTERY BYPASS GRAFT N/A 10/04/2019   Procedure: CORONARY ARTERY BYPASS GRAFTING (CABG) times three using right greater saphenous vein and left internal mammary ;  Surgeon: Ivin Poot, MD;  Location: [place] [facility];  Service: Open Heart Surgery;  Laterality: N/A;   CYST EXCISION     ENDOVEIN HARVEST OF GREATER SAPHENOUS VEIN Right 10/04/2019   Procedure: [name] Ropes Of Greater Saphenous Vein;  Surgeon: Ivin Poot, MD;  Location: [place];  Service: Open Heart Surgery;  Laterality: Right;   EXCISION OF KELOID N/A 10/04/2019   Procedure: Excision Of Sternal Keloid;  Surgeon: Ivin Poot, MD;  Location: [place] [facility];  Service: Open Heart Surgery;  Laterality: N/A;   LAPAROSCOPIC LYSIS INTESTINAL ADHESIONS  2012   LEFT HEART CATH AND CORONARY ANGIOGRAPHY N/A 09/23/2019   Procedure: LEFT HEART CATH AND CORONARY ANGIOGRAPHY;  Surgeon: Belva Crome, MD;  Location: [facility];  Service: Cardiovascular;  Laterality: N/A;   TEE WITHOUT CARDIOVERSION N/A 10/04/2019   Procedure: TRANSESOPHAGEAL ECHOCARDIOGRAM (TEE);  Surgeon: Prescott Gum, Collier Salina, MD;  Location: [facility];  Service: Open Heart Surgery;  Laterality: N/A;    Allergies  Allergies  Allergen Reactions   Penicillins Hives    In his  20's    History of Present Illness    Steve Frye is a 76 y.o. male who presents via audio/video conferencing for a telehealth visit today.  Pt was last seen in cardiology clinic on 01/30/21 by Dr. Bettina Gavia.  At that time Steve Frye was doing well.  The patient is now pending procedure as outlined above. Since his last visit, he denies chest pain, shortness of breath, lower extremity edema, fatigue, palpitations, melena, hematuria, hemoptysis, diaphoresis, weakness,  presyncope, syncope, orthopnea, and PND.  Home Medications    Prior to Admission medications   Medication Sig Start Date End Date Taking? Authorizing Provider  amiodarone (PACERONE) 200 MG tablet Take 1 tablet (200 mg total) by mouth daily. 07/30/21   Richardo Priest, MD  Ascorbic Acid (VITAMIN C) 1000 MG tablet Take 1,000 mg by mouth daily.    [provider]  atorvastatin (LIPITOR) 40 MG tablet Take 40 mg by mouth at bedtime. 08/02/21   [provider]  BREZTRI AEROSPHERE 160-9-4.8 MCG/ACT AERO INHALE 2 PUFFS INTO THE LUNGS IN THE MORNING AND AT BEDTIME. 07/25/21   Kozlow, Donnamarie Poag, MD  Cholecalciferol (VITAMIN D3 PO) Take by mouth.    [provider]  ELIQUIS 5 MG TABS tablet TAKE 1 TABLET BY MOUTH TWICE A DAY 10/25/21   Richardo Priest, MD  famotidine (PEPCID) 40 MG tablet TAKE 1 TABLET BY MOUTH EVERY DAY 07/25/21   Kozlow, Donnamarie Poag, MD  finasteride (PROSCAR) 5 MG tablet Take 5 mg daily by mouth. 12/30/16   [provider]  metoprolol tartrate (LOPRESSOR) 25 MG tablet TAKE 1 TABLET BY MOUTH TWICE A DAY 10/25/21   Richardo Priest, MD  Multiple Vitamins-Minerals (PRESERVISION AREDS 2 PO) Take 1 tablet by mouth daily.    [provider]  Probiotic Product (ALIGN PO) Take 1 tablet by mouth daily.    [provider]  triamcinolone (NASACORT) 55 MCG/ACT AERO nasal inhaler Using two sprays in each nostril every night as directed.     [provider]  triamcinolone ointment (KENALOG) 0.1 % Apply 1 application topically 3 (three) times daily as needed (Dry skin).  08/09/19   [provider]  VENTOLIN HFA 108 (90 Base) MCG/ACT inhaler SMARTSIG:2 Puff(s) By Mouth 07/24/21   [provider]    Physical Exam    Vital Signs:  Steve Frye does not have vital signs available for review today.  Given telephonic nature of communication, physical exam is limited. AAOx3. NAD. Normal affect.  Speech and respirations are  unlabored.  Accessory Clinical Findings    None  Assessment & Plan    1.  Preoperative Cardiovascular Risk Assessment: The patient is doing well from a cardiac perspective. Therefore, based on ACC/AHA guidelines, the patient would be at acceptable risk for the planned procedure without further cardiovascular testing. The patient was advised that if he develops new symptoms prior to surgery to contact our office to arrange for a follow-up visit, and he verbalized understanding. According to the Revised Cardiac Risk Index (RCRI), his Perioperative Risk of Major Cardiac Event is (%): 0.4. His Functional Capacity in METs is: 7.59 according to the Duke Activity Status Index (DASI). Per office protocol, patient can hold Eliquis for 1-2 days prior to procedure.   A copy of this note will be routed to requesting surgeon.  Time:   Today, I have spent 10 minutes with the patient with telehealth technology discussing medical history, symptoms, and management plan.     Sharyn Lull  Elwyn Reach, NP-C    11/14/2021, 1:17 PM [place] [address] Office [phone] Fax [phone]

## 2021-12-05 DIAGNOSIS — D125 Benign neoplasm of sigmoid colon: Secondary | ICD-10-CM | POA: Diagnosis not present

## 2021-12-05 DIAGNOSIS — D120 Benign neoplasm of cecum: Secondary | ICD-10-CM | POA: Diagnosis not present

## 2021-12-05 DIAGNOSIS — Z1211 Encounter for screening for malignant neoplasm of colon: Secondary | ICD-10-CM | POA: Diagnosis not present

## 2021-12-05 DIAGNOSIS — D122 Benign neoplasm of ascending colon: Secondary | ICD-10-CM | POA: Diagnosis not present

## 2021-12-05 DIAGNOSIS — Z7901 Long term (current) use of anticoagulants: Secondary | ICD-10-CM | POA: Diagnosis not present

## 2021-12-05 DIAGNOSIS — J449 Chronic obstructive pulmonary disease, unspecified: Secondary | ICD-10-CM | POA: Diagnosis not present

## 2021-12-05 DIAGNOSIS — I10 Essential (primary) hypertension: Secondary | ICD-10-CM | POA: Diagnosis not present

## 2021-12-05 DIAGNOSIS — K648 Other hemorrhoids: Secondary | ICD-10-CM | POA: Diagnosis not present

## 2021-12-05 DIAGNOSIS — D126 Benign neoplasm of colon, unspecified: Secondary | ICD-10-CM | POA: Diagnosis not present

## 2021-12-05 DIAGNOSIS — K635 Polyp of colon: Secondary | ICD-10-CM | POA: Diagnosis not present

## 2021-12-05 DIAGNOSIS — Z86010 Personal history of colonic polyps: Secondary | ICD-10-CM | POA: Diagnosis not present

## 2021-12-05 DIAGNOSIS — D124 Benign neoplasm of descending colon: Secondary | ICD-10-CM | POA: Diagnosis not present

## 2021-12-18 DIAGNOSIS — C44622 Squamous cell carcinoma of skin of right upper limb, including shoulder: Secondary | ICD-10-CM | POA: Diagnosis not present

## 2022-02-04 DIAGNOSIS — E785 Hyperlipidemia, unspecified: Secondary | ICD-10-CM

## 2022-02-04 DIAGNOSIS — J1282 Pneumonia due to coronavirus disease 2019: Secondary | ICD-10-CM

## 2022-02-04 DIAGNOSIS — U071 COVID-19: Secondary | ICD-10-CM

## 2022-02-04 DIAGNOSIS — E871 Hypo-osmolality and hyponatremia: Secondary | ICD-10-CM

## 2022-02-04 DIAGNOSIS — K567 Ileus, unspecified: Secondary | ICD-10-CM

## 2022-02-04 DIAGNOSIS — R7989 Other specified abnormal findings of blood chemistry: Secondary | ICD-10-CM

## 2022-02-04 HISTORY — DX: COVID-19: U07.1

## 2022-02-04 HISTORY — DX: Hypo-osmolality and hyponatremia: E87.1

## 2022-02-04 HISTORY — DX: Other specified abnormal findings of blood chemistry: R79.89

## 2022-02-04 HISTORY — DX: Hyperlipidemia, unspecified: E78.5

## 2022-02-04 HISTORY — DX: Ileus, unspecified: K56.7

## 2022-02-04 HISTORY — DX: Pneumonia due to coronavirus disease 2019: J12.82

## 2022-02-05 ENCOUNTER — Other Ambulatory Visit: Payer: Self-pay

## 2022-02-06 ENCOUNTER — Ambulatory Visit: Payer: Medicare Other | Attending: Cardiology | Admitting: Cardiology

## 2022-02-06 ENCOUNTER — Encounter: Payer: Self-pay | Admitting: Cardiology

## 2022-02-06 VITALS — BP 130/80 | HR 53 | Ht 70.0 in | Wt 215.0 lb

## 2022-02-06 DIAGNOSIS — E785 Hyperlipidemia, unspecified: Secondary | ICD-10-CM | POA: Insufficient documentation

## 2022-02-06 DIAGNOSIS — I25118 Atherosclerotic heart disease of native coronary artery with other forms of angina pectoris: Secondary | ICD-10-CM | POA: Insufficient documentation

## 2022-02-06 DIAGNOSIS — I480 Paroxysmal atrial fibrillation: Secondary | ICD-10-CM | POA: Diagnosis not present

## 2022-02-06 DIAGNOSIS — Z7901 Long term (current) use of anticoagulants: Secondary | ICD-10-CM | POA: Insufficient documentation

## 2022-02-06 DIAGNOSIS — Z79899 Other long term (current) drug therapy: Secondary | ICD-10-CM | POA: Diagnosis not present

## 2022-02-06 NOTE — Addendum Note (Signed)
Addended by: Edwyna Shell I on: 02/06/2022 08:34 AM   Modules accepted: Orders

## 2022-02-06 NOTE — Patient Instructions (Signed)
Medication Instructions:  Your physician recommends that you continue on your current medications as directed. Please refer to the Current Medication list given to you today.  *If you need a refill on your cardiac medications before your next appointment, please call your pharmacy*   Lab Work: Your physician recommends that you return for lab work in:   Labs today: CMP, Lipids, TSH T3 T4  If you have labs (blood work) drawn today and your tests are completely normal, you will receive your results only by: MyChart Message (if you have [facility]) OR A paper copy in the mail If you have any lab test that is abnormal or we need to change your treatment, we will call you to review the results.   Testing/Procedures: None   Follow-Up: At [facility], you and your health needs are our priority.  As part of our continuing mission to provide you with exceptional heart care, we have created designated Provider Care Teams.  These Care Teams include your primary Cardiologist (physician) and Advanced Practice Providers (APPs -  Physician Assistants and Nurse Practitioners) who all work together to provide you with the care you need, when you need it.  We recommend signing up for the patient portal called "MyChart".  Sign up information is provided on this After Visit Summary.  MyChart is used to connect with patients for Virtual Visits (Telemedicine).  Patients are able to view lab/test results, encounter notes, upcoming appointments, etc.  Non-urgent messages can be sent to your provider as well.   To learn more about what you can do with MyChart, go to [email or web address].    Your next appointment:   9 month(s)  The format for your next appointment:   In Person  Provider:   Shirlee More, MD    Other Instructions Get a FitBit  Goal of 8500 steps a day.  Important Information About Sugar

## 2022-02-06 NOTE — Progress Notes (Signed)
Cardiology Office Note:    Date:  02/06/2022   ID:  Steve Frye, DOB 26-Jan-1946, MRN [id number]  PCP:  Street, Sharon Mt, MD  Cardiologist:  Shirlee More, MD    Referring MD: [address] Sharon Mt, *    ASSESSMENT:    1. Paroxysmal atrial fibrillation (HCC)   2. On amiodarone therapy   3. Chronic anticoagulation   4. Coronary artery disease of native artery of native heart with stable angina pectoris ([place])   5. Hyperlipidemia, unspecified hyperlipidemia type    PLAN:    In order of problems listed above:  Doing well maintaining sinus rhythm continue low-dose amiodarone recheck liver thyroid and continue his current anticoagulant Stable CAD following CABG having no anginal discomfort continue treatment including his beta-blocker and high intensity statin Continue his high intensity statin we will recheck liver function and lipid profile   Next appointment: 9 months   Medication Adjustments/Labs and Tests Ordered: Current medicines are reviewed at length with the patient today.  Concerns regarding medicines are outlined above.  No orders of the defined types were placed in this encounter.  No orders of the defined types were placed in this encounter.   Chief Complaint  Patient presents with   Follow-up   Atrial Fibrillation    On amiodarone     History of Present Illness:    Steve Frye is a 76 y.o. male with a hx of paroxysmal atrial fibrillation maintaining sinus rhythm on amiodarone anticoagulated CAD with CABG and hyper lipidemia  last seen 01/30/2021.  Compliance with diet, lifestyle and medications: Yes  He is pleased with the quality of his life remains active he has minimal shortness of breath with more than usual activities no edema orthopnea chest pain palpitation or syncope He tolerates his statin without muscle pain or weakness He had no bleeding from his anticoagulant  I encouraged him to purchase a smart watch Fitbit to monitor rhythm at home  and also steps with a goal of 8500/day Past Medical History:  Diagnosis Date   Acute coronary syndrome ([place]) 09/28/2019   Arthritis 01/15/2017   Atrial fibrillation ([place])    Cancer ([place])    skin cancer   Chest mass 12/04/2015   Chronic hyponatremia 02/04/2022   Coronary artery disease    Coronary artery disease of native artery of native heart with stable angina pectoris ([place])    COVID-19 virus infection 01/30/2020   Cutaneous abscess of chest wall 12/04/2015   Dyslipidemia 02/04/2022   Dyspnea    Dysrhythmia    afib   Encounter for postoperative wound check 02/01/2020   Epidermoid cyst of skin 12/04/2015   GERD (gastroesophageal reflux disease) 01/15/2017   High risk medication use 01/10/2015   Overview:  Overview:  flecanide   Hyperlipidemia 01/15/2017   Ileus ([place]) 02/04/2022   LAE (left atrial enlargement) 01/15/2017   Open wound of chest wall 04/01/2016   PAF (paroxysmal atrial fibrillation) ([place]) 01/10/2015   Overview:  CHADS2 vasc score=1   Pneumonia due to COVID-19 virus 02/04/2022   Postop check 11/03/2019   S/P CABG x 3 10/04/2019   Troponin I above reference range 02/04/2022    Past Surgical History:  Procedure Laterality Date   APPENDECTOMY     CARDIAC CATHETERIZATION     09/23/19   CHOLECYSTECTOMY     CORONARY ARTERY BYPASS GRAFT N/A 10/04/2019   Procedure: CORONARY ARTERY BYPASS GRAFTING (CABG) times three using right greater saphenous vein and left internal mammary ;  Surgeon: Prescott Gum,  Collier Salina, MD;  Location: [facility];  Service: Open Heart Surgery;  Laterality: N/A;   CYST EXCISION     ENDOVEIN HARVEST OF GREATER SAPHENOUS VEIN Right 10/04/2019   Procedure: [name] Of Greater Saphenous Vein;  Surgeon: Ivin Poot, MD;  Location: [place] [facility];  Service: Open Heart Surgery;  Laterality: Right;   EXCISION OF KELOID N/A 10/04/2019   Procedure: Excision Of Sternal Keloid;  Surgeon: Ivin Poot, MD;  Location: [facility];  Service: Open Heart Surgery;  Laterality: N/A;    LAPAROSCOPIC LYSIS INTESTINAL ADHESIONS  2012   LEFT HEART CATH AND CORONARY ANGIOGRAPHY N/A 09/23/2019   Procedure: LEFT HEART CATH AND CORONARY ANGIOGRAPHY;  Surgeon: Belva Crome, MD;  Location: [facility];  Service: Cardiovascular;  Laterality: N/A;   TEE WITHOUT CARDIOVERSION N/A 10/04/2019   Procedure: TRANSESOPHAGEAL ECHOCARDIOGRAM (TEE);  Surgeon: Prescott Gum, Collier Salina, MD;  Location: [facility];  Service: Open Heart Surgery;  Laterality: N/A;    Current Medications: Current Meds  Medication Sig   amiodarone (PACERONE) 200 MG tablet Take 1 tablet (200 mg total) by mouth daily.   Ascorbic Acid (VITAMIN C) 1000 MG tablet Take 1,000 mg by mouth daily.   atorvastatin (LIPITOR) 40 MG tablet Take 40 mg by mouth at bedtime.   BREZTRI AEROSPHERE 160-9-4.8 MCG/ACT AERO INHALE 2 PUFFS INTO THE LUNGS IN THE MORNING AND AT BEDTIME.   Cholecalciferol (VITAMIN D3 PO) Take 1 tablet by mouth daily.   ELIQUIS 5 MG TABS tablet TAKE 1 TABLET BY MOUTH TWICE A DAY   famotidine (PEPCID) 40 MG tablet TAKE 1 TABLET BY MOUTH EVERY DAY   finasteride (PROSCAR) 5 MG tablet Take 5 mg by mouth daily.   metoprolol tartrate (LOPRESSOR) 25 MG tablet TAKE 1 TABLET BY MOUTH TWICE A DAY   Multiple Vitamins-Minerals (PRESERVISION AREDS 2 PO) Take 1 tablet by mouth daily.   Probiotic Product (ALIGN PO) Take 1 tablet by mouth daily.   triamcinolone (NASACORT) 55 MCG/ACT AERO nasal inhaler Place 2 sprays into the nose at bedtime.   triamcinolone ointment (KENALOG) 0.1 % Apply 1 application topically 3 (three) times daily as needed (Dry skin).    VENTOLIN HFA 108 (90 Base) MCG/ACT inhaler Inhale 2 puffs into the lungs as needed for wheezing or shortness of breath.     Allergies:   Penicillins   Social History   Socioeconomic History   Marital status: Married    Spouse name: Not on file   Number of children: Not on file   Years of education: Not on file   Highest education level: Not on file  Occupational History    Not on file  Tobacco Use   Smoking status: Former    Years: 20.00    Types: Cigarettes    Quit date: 01/10/1979    Years since quitting: 43.1   Smokeless tobacco: Never  Vaping Use   Vaping Use: Never used  Substance and Sexual Activity   Alcohol use: No   Drug use: No   Sexual activity: Not on file  Other Topics Concern   Not on file  Social History Narrative   Not on file   Social Determinants of Health   Financial Resource Strain: Not on file  Food Insecurity: Not on file  Transportation Needs: Not on file  Physical Activity: Not on file  Stress: Not on file  Social Connections: Not on file     Family History: The patient's family history includes Heart disease in  his father; Heart failure in his father. ROS:   Please see the history of present illness.    All other systems reviewed and are negative.  EKGs/Labs/Other Studies Reviewed:    The following studies were reviewed today:  EKG:  EKG ordered today and personally reviewed.  The ekg ordered today demonstrates sinus bradycardia 53 bpm otherwise normal EKG normal PR interval With complaints of chest pain he underwent myocardial perfusion study 08/02/2020 that was normal EF 65% low risk   Study Highlights   Nuclear stress EF: 65%. There was no ST segment deviation noted during stress. The study is normal. This is a low risk study. The left ventricular ejection fraction is normal (55-65%). Recent Labs: 07/25/2021 cholesterol 150 LDL 80 A1c 5.8% hemoglobin 14.6 creatinine 0.9 potassium 4.1.  All these labs are good/at target Recent Lipid Panel    Component Value Date/Time   CHOL 162 01/30/2021 1314   TRIG 200 (H) 01/30/2021 1314   HDL 48 01/30/2021 1314   CHOLHDL 3.4 01/30/2021 1314   LDLCALC 80 01/30/2021 1314    Physical Exam:    VS:  BP 130/80 (BP Location: Right Arm, Patient Position: Sitting, Cuff Size: Normal)   Pulse (!) 53   Ht '5\' 10"'$  (1.778 m)   Wt 215 lb (97.5 kg)   SpO2 97%   BMI 30.85  kg/m     Wt Readings from Last 3 Encounters:  02/06/22 215 lb (97.5 kg)  10/07/21 220 lb (99.8 kg)  01/30/21 213 lb (96.6 kg)     GEN:  Well nourished, well developed in no acute distress HEENT: Normal NECK: No JVD; No carotid bruits LYMPHATICS: No lymphadenopathy CARDIAC: RRR, no murmurs, rubs, gallops RESPIRATORY:  Clear to auscultation without rales, wheezing or rhonchi  ABDOMEN: Soft, non-tender, non-distended MUSCULOSKELETAL:  No edema; No deformity  SKIN: Warm and dry NEUROLOGIC:  Alert and oriented x 3 PSYCHIATRIC:  Normal affect    Signed, Shirlee More, MD  02/06/2022 8:16 AM    [place]

## 2022-02-07 LAB — COMPREHENSIVE METABOLIC PANEL
ALT: 33 IU/L (ref 0–44)
AST: 27 IU/L (ref 0–40)
Albumin/Globulin Ratio: 1.7 (ref 1.2–2.2)
Albumin: 4 g/dL (ref 3.8–4.8)
Alkaline Phosphatase: 83 IU/L (ref 44–121)
BUN/Creatinine Ratio: 11 (ref 10–24)
BUN: 10 mg/dL (ref 8–27)
Bilirubin Total: 0.7 mg/dL (ref 0.0–1.2)
CO2: 25 mmol/L (ref 20–29)
Calcium: 8.9 mg/dL (ref 8.6–10.2)
Chloride: 105 mmol/L (ref 96–106)
Creatinine, Ser: 0.94 mg/dL (ref 0.76–1.27)
Globulin, Total: 2.4 g/dL (ref 1.5–4.5)
Glucose: 93 mg/dL (ref 70–99)
Potassium: 4.3 mmol/L (ref 3.5–5.2)
Sodium: 139 mmol/L (ref 134–144)
Total Protein: 6.4 g/dL (ref 6.0–8.5)
eGFR: 83 mL/min/{1.73_m2} (ref >59)

## 2022-02-07 LAB — TSH+T4F+T3FREE
Free T4: 1.92 ng/dL — ABNORMAL HIGH (ref 0.82–1.77)
T3, Free: 2.7 pg/mL (ref 2.0–4.4)
TSH: 1.13 u[IU]/mL (ref 0.450–4.500)

## 2022-02-07 LAB — LIPID PANEL
Chol/HDL Ratio: 3 ratio (ref 0.0–5.0)
Cholesterol, Total: 146 mg/dL (ref 100–199)
HDL: 48 mg/dL (ref >39)
LDL Chol Calc (NIH): 83 mg/dL (ref 0–99)
Triglycerides: 79 mg/dL (ref 0–149)
VLDL Cholesterol Cal: 15 mg/dL (ref 5–40)

## 2022-02-12 ENCOUNTER — Telehealth: Payer: Self-pay | Admitting: Cardiology

## 2022-02-12 NOTE — Telephone Encounter (Signed)
Patient returned CMA's call regarding results. 

## 2022-02-21 ENCOUNTER — Other Ambulatory Visit: Payer: Self-pay | Admitting: Cardiology

## 2022-02-24 NOTE — Telephone Encounter (Signed)
Rx refill sent to pharmacy. 

## 2022-02-26 DIAGNOSIS — Z23 Encounter for immunization: Secondary | ICD-10-CM | POA: Diagnosis not present

## 2022-04-02 ENCOUNTER — Other Ambulatory Visit: Payer: Self-pay | Admitting: Allergy and Immunology

## 2022-04-03 DIAGNOSIS — L570 Actinic keratosis: Secondary | ICD-10-CM | POA: Diagnosis not present

## 2022-04-03 DIAGNOSIS — L578 Other skin changes due to chronic exposure to nonionizing radiation: Secondary | ICD-10-CM | POA: Diagnosis not present

## 2022-04-03 DIAGNOSIS — L814 Other melanin hyperpigmentation: Secondary | ICD-10-CM | POA: Diagnosis not present

## 2022-04-03 DIAGNOSIS — C44722 Squamous cell carcinoma of skin of right lower limb, including hip: Secondary | ICD-10-CM | POA: Diagnosis not present

## 2022-04-03 DIAGNOSIS — L820 Inflamed seborrheic keratosis: Secondary | ICD-10-CM | POA: Diagnosis not present

## 2022-04-09 ENCOUNTER — Ambulatory Visit (INDEPENDENT_AMBULATORY_CARE_PROVIDER_SITE_OTHER): Payer: Medicare Other | Admitting: Allergy and Immunology

## 2022-04-09 ENCOUNTER — Encounter: Payer: Self-pay | Admitting: Allergy and Immunology

## 2022-04-09 VITALS — BP 122/84 | HR 56 | Resp 12 | Ht 70.0 in | Wt 221.0 lb

## 2022-04-09 DIAGNOSIS — K219 Gastro-esophageal reflux disease without esophagitis: Secondary | ICD-10-CM

## 2022-04-09 DIAGNOSIS — J4489 Other specified chronic obstructive pulmonary disease: Secondary | ICD-10-CM

## 2022-04-09 DIAGNOSIS — I25118 Atherosclerotic heart disease of native coronary artery with other forms of angina pectoris: Secondary | ICD-10-CM | POA: Diagnosis not present

## 2022-04-09 DIAGNOSIS — J3089 Other allergic rhinitis: Secondary | ICD-10-CM

## 2022-04-09 MED ORDER — BREZTRI AEROSPHERE 160-9-4.8 MCG/ACT IN AERO: INHALATION_SPRAY | RESPIRATORY_TRACT | 5 refills | Status: DC

## 2022-04-09 MED ORDER — FAMOTIDINE 40 MG PO TABS: ORAL_TABLET | ORAL | 1 refills | Status: DC

## 2022-04-09 NOTE — Progress Notes (Unsigned)
[facility]   Follow-up Note  Referring Provider: Street, Sharon Mt, * Primary Provider: Street, Sharon Mt, MD Date of Office Visit: 04/09/2022  Subjective:   Steve Frye (DOB: 16-Jan-1946) is a 76 y.o. male who returns to the [facility] on 04/09/2022 in re-evaluation of the following:  HPI: Steve Frye returns to this clinic in evaluation of COPD/asthma overlap, rhinitis, LPR.  I last saw him in this clinic 07 October 2021.  Overall he has done very well with his airway.  He continues to use his triple inhaler every day and he continues on a nasal steroid every day and he is very satisfied with the response that he is received with this form of therapy.  He does not need to use a short acting bronchodilator and he can exert himself without a problem and he has not required a systemic steroid or antibiotic for any airway issue since his last visit.  He thinks his reflux is under control.  But he does have some throat clearing.  He has received a flu vaccine.  Allergies as of 04/09/2022       Reactions   Penicillins Hives   In his 20's        Medication List        Accurate as of April 09, 2022 10:42 AM. If you have any questions, ask your nurse or doctor.          STOP taking these medications    PRESERVISION AREDS 2 PO Stopped by: Merly Hinkson Kevan Rosebush, MD       TAKE these medications    ALIGN PO Take 1 tablet by mouth daily.   amiodarone 200 MG tablet Commonly known as: PACERONE TAKE 1 TABLET BY MOUTH EVERY DAY   atorvastatin 40 MG tablet Commonly known as: LIPITOR Take 40 mg by mouth at bedtime.   Breztri Aerosphere 160-9-4.8 MCG/ACT Aero Generic drug: Budeson-Glycopyrrol-Formoterol INHALE 2 PUFFS INTO THE LUNGS IN THE MORNING AND AT BEDTIME.   Eliquis 5 MG Tabs tablet Generic drug: apixaban TAKE 1 TABLET BY MOUTH TWICE A DAY   famotidine 40 MG tablet Commonly known as: PEPCID TAKE 1  TABLET BY MOUTH EVERY DAY   finasteride 5 MG tablet Commonly known as: PROSCAR Take 5 mg by mouth daily.   metoprolol tartrate 25 MG tablet Commonly known as: LOPRESSOR TAKE 1 TABLET BY MOUTH TWICE A DAY   triamcinolone 55 MCG/ACT Aero nasal inhaler Commonly known as: NASACORT Place 2 sprays into the nose at bedtime.   triamcinolone ointment 0.1 % Commonly known as: KENALOG Apply 1 application topically 3 (three) times daily as needed (Dry skin).   Ventolin HFA 108 (90 Base) MCG/ACT inhaler Generic drug: albuterol Inhale 2 puffs into the lungs as needed for wheezing or shortness of breath.   vitamin C 1000 MG tablet Take 1,000 mg by mouth daily.   VITAMIN D3 PO Take 1 tablet by mouth daily.        Past Medical History:  Diagnosis Date   Acute coronary syndrome ([place]) 09/28/2019   Arthritis 01/15/2017   Atrial fibrillation (HCC)    Cancer (HCC)    skin cancer   Chest mass 12/04/2015   Chronic hyponatremia 02/04/2022   Coronary artery disease    Coronary artery disease of native artery of native heart with stable angina pectoris ([place])    COVID-19 virus infection 01/30/2020   Cutaneous abscess of chest wall 12/04/2015  Dyslipidemia 02/04/2022   Dyspnea    Dysrhythmia    afib   Encounter for postoperative wound check 02/01/2020   Epidermoid cyst of skin 12/04/2015   GERD (gastroesophageal reflux disease) 01/15/2017   High risk medication use 01/10/2015   Overview:  Overview:  flecanide   Hyperlipidemia 01/15/2017   Ileus ([place]) 02/04/2022   LAE (left atrial enlargement) 01/15/2017   Open wound of chest wall 04/01/2016   PAF (paroxysmal atrial fibrillation) ([place]) 01/10/2015   Overview:  CHADS2 vasc score=1   Pneumonia due to COVID-19 virus 02/04/2022   Postop check 11/03/2019   S/P CABG x 3 10/04/2019   Troponin I above reference range 02/04/2022    Past Surgical History:  Procedure Laterality Date   APPENDECTOMY     CARDIAC CATHETERIZATION     09/23/19   CHOLECYSTECTOMY      CORONARY ARTERY BYPASS GRAFT N/A 10/04/2019   Procedure: CORONARY ARTERY BYPASS GRAFTING (CABG) times three using right greater saphenous vein and left internal mammary ;  Surgeon: Ivin Poot, MD;  Location: [facility];  Service: Open Heart Surgery;  Laterality: N/A;   CYST EXCISION     ENDOVEIN HARVEST OF GREATER SAPHENOUS VEIN Right 10/04/2019   Procedure: [name] Of Greater Saphenous Vein;  Surgeon: Ivin Poot, MD;  Location: [facility];  Service: Open Heart Surgery;  Laterality: Right;   EXCISION OF KELOID N/A 10/04/2019   Procedure: Excision Of Sternal Keloid;  Surgeon: Ivin Poot, MD;  Location: [place] [place];  Service: Open Heart Surgery;  Laterality: N/A;   LAPAROSCOPIC LYSIS INTESTINAL ADHESIONS  2012   LEFT HEART CATH AND CORONARY ANGIOGRAPHY N/A 09/23/2019   Procedure: LEFT HEART CATH AND CORONARY ANGIOGRAPHY;  Surgeon: Belva Crome, MD;  Location: [facility];  Service: Cardiovascular;  Laterality: N/A;   TEE WITHOUT CARDIOVERSION N/A 10/04/2019   Procedure: TRANSESOPHAGEAL ECHOCARDIOGRAM (TEE);  Surgeon: Prescott Gum, Collier Salina, MD;  Location: [facility];  Service: Open Heart Surgery;  Laterality: N/A;    Review of systems negative except as noted in HPI / PMHx or noted below:  Review of Systems  Constitutional: Negative.   HENT: Negative.    Eyes: Negative.   Respiratory: Negative.    Cardiovascular: Negative.   Gastrointestinal: Negative.   Genitourinary: Negative.   Musculoskeletal: Negative.   Skin: Negative.   Neurological: Negative.   Endo/Heme/Allergies: Negative.   Psychiatric/Behavioral: Negative.       Objective:   Vitals:   04/09/22 1037  BP: 122/84  Pulse: (!) 56  Resp: 12  SpO2: 97%   Height: '5\' 10"'$  (177.8 cm)  Weight: 221 lb (100.2 kg)   Physical Exam Constitutional:      Appearance: He is not diaphoretic.  HENT:     Head: Normocephalic.     Right Ear: Tympanic membrane, ear canal and external ear normal.     Left Ear: Tympanic membrane,  ear canal and external ear normal.     Nose: Nose normal. No mucosal edema or rhinorrhea.     Mouth/Throat:     Pharynx: Uvula midline. No oropharyngeal exudate.  Eyes:     Conjunctiva/sclera: Conjunctivae normal.  Neck:     Thyroid: No thyromegaly.     Trachea: Trachea normal. No tracheal tenderness or tracheal deviation.  Cardiovascular:     Rate and Rhythm: Normal rate and regular rhythm.     Heart sounds: Normal heart sounds, S1 normal and S2 normal. No murmur heard. Pulmonary:     Effort:  No respiratory distress.     Breath sounds: Normal breath sounds. No stridor. No wheezing or rales.  Lymphadenopathy:     Head:     Right side of head: No tonsillar adenopathy.     Left side of head: No tonsillar adenopathy.     Cervical: No cervical adenopathy.  Skin:    Findings: No erythema or rash.     Nails: There is no clubbing.  Neurological:     Mental Status: He is alert.     Diagnostics:    Spirometry was performed and demonstrated an FEV1 of 2.49 at 84 % of predicted.  Assessment and Plan:   1. COPD with asthma   2. Perennial allergic rhinitis   3. LPRD (laryngopharyngeal reflux disease)     1.  Continue to treat and prevent inflammation:   A.  Nasacort- 1-2 sprays each nostril 1 time per day  B.  Breztri - 2 inhalations 2 times per day  2. Continue to Treat and prevent reflux:   A. Famotidine '40mg'$  tablet 1-2 times per day  3. If needed:   A. Nasal saline  B. OTC antihistamine  C. Albuterol HFA - 2 inhalations every 4-6 hours  4.  Return to clinic in 6 months or earlier if problem  5.  Consider obtaining RSV vaccine  I think that Johnn is doing pretty well regarding his airway issue on his current plan of anti-inflammatory agents for his airway.  He may have a little bit more problems with LPR and I made the suggestion that he consider using famotidine twice a day for a month to see if it clears up this issue.  If he does well with this plan I will see him back  in this clinic in 6 months or earlier if there is a problem.  Allena Katz, MD Allergy / Immunology [place]

## 2022-04-09 NOTE — Patient Instructions (Signed)
  1.  Continue to treat and prevent inflammation:   A.  Nasacort- 1-2 sprays each nostril 1 time per day  B.  Breztri - 2 inhalations 2 times per day  2. Continue to Treat and prevent reflux:   A. Famotidine '40mg'$  tablet 1-2 times per day  3. If needed:   A. Nasal saline  B. OTC antihistamine  C. Albuterol HFA - 2 inhalations every 4-6 hours  4.  Return to clinic in 6 months or earlier if problem  5.  Consider obtaining RSV vaccine

## 2022-04-10 ENCOUNTER — Encounter: Payer: Self-pay | Admitting: Allergy and Immunology

## 2022-04-29 ENCOUNTER — Other Ambulatory Visit: Payer: Self-pay | Admitting: Cardiology

## 2022-04-29 NOTE — Telephone Encounter (Signed)
Prescription refill request for Eliquis received. Indication:a fib Last office visit:10/23 Scr:0.9 Age: 77 Weight:100.2  kg  Prescription refilled

## 2022-06-04 DIAGNOSIS — L821 Other seborrheic keratosis: Secondary | ICD-10-CM | POA: Diagnosis not present

## 2022-06-04 DIAGNOSIS — L853 Xerosis cutis: Secondary | ICD-10-CM | POA: Diagnosis not present

## 2022-06-04 DIAGNOSIS — L570 Actinic keratosis: Secondary | ICD-10-CM | POA: Diagnosis not present

## 2022-06-04 DIAGNOSIS — L814 Other melanin hyperpigmentation: Secondary | ICD-10-CM | POA: Diagnosis not present

## 2022-06-04 DIAGNOSIS — D225 Melanocytic nevi of trunk: Secondary | ICD-10-CM | POA: Diagnosis not present

## 2022-06-10 DIAGNOSIS — J309 Allergic rhinitis, unspecified: Secondary | ICD-10-CM | POA: Diagnosis not present

## 2022-06-18 ENCOUNTER — Ambulatory Visit: Payer: PPO | Admitting: Allergy and Immunology

## 2022-06-18 ENCOUNTER — Encounter: Payer: Self-pay | Admitting: Allergy and Immunology

## 2022-06-18 VITALS — BP 120/76 | HR 56 | Resp 16

## 2022-06-18 DIAGNOSIS — J4489 Other specified chronic obstructive pulmonary disease: Secondary | ICD-10-CM | POA: Diagnosis not present

## 2022-06-18 DIAGNOSIS — J3089 Other allergic rhinitis: Secondary | ICD-10-CM

## 2022-06-18 DIAGNOSIS — K219 Gastro-esophageal reflux disease without esophagitis: Secondary | ICD-10-CM | POA: Diagnosis not present

## 2022-06-18 NOTE — Patient Instructions (Addendum)
  1.  Continue to treat and prevent inflammation:   A.  Nasacort- 1-2 sprays each nostril 1 time per day  B.  Breztri - 2 inhalations 1-2 times per day  2. Continue to Treat and prevent reflux:   A. Famotidine 25m tablet 1-2 times per day  3. If needed:   A. Nasal saline  B. OTC antihistamine  C. Albuterol HFA - 2 inhalations every 4-6 hours  4. For this recent event:   A. Consistently use Breztri - 2 times per day  B. Consistently use famotidine 2 times per day  C. Mucinex DM - 2 times per day  D. Loratadine - 2 times per day  E. Nasal saline if needed  5.  Return to clinic in 6 months or earlier if problem

## 2022-06-18 NOTE — Progress Notes (Unsigned)
[facility] [place] [facility] [place]   Follow-up Note  Referring Provider: Street, Sharon Mt, * Primary Provider: Street, Sharon Mt, MD Date of Office Visit: 06/18/2022  Subjective:   Steve Frye (DOB: August 22, 1945) is a 77 y.o. male who returns to the [facility] on 06/18/2022 in re-evaluation of the following:  HPI: Jenesis presents to this clinic in evaluation of COPD/asthma overlap, rhinitis, LPR.  I last saw him in this clinic 09 April 2022.  He was really doing very well until he contracted what appears to be a viral respiratory tract infection associated with a negative Covid swab.  Approximately 7 days ago after putting out some pine needles he developed sneezing and coughing and clear rhinorrhea and now he has an irritated throat and some mucus in his throat and he thinks that some of the mucus might be pink.  He does not have any anosmia or ugly nasal discharge and he does not think his reflux has been active.  He thinks that he has been wheezing.  He saw his primary care doctor and was treated with prednisone which she finished a few days ago.  Should be noted that his wife is at home with a "cold" which she contracted within 3 days of the development of Ariez's symptoms.  Allergies as of 06/18/2022       Reactions   Penicillins Hives   In his 20's        Medication List    ALIGN PO Take 1 tablet by mouth daily.   amiodarone 200 MG tablet Commonly known as: PACERONE TAKE 1 TABLET BY MOUTH EVERY DAY   atorvastatin 40 MG tablet Commonly known as: LIPITOR Take 40 mg by mouth at bedtime.   Breztri Aerosphere 160-9-4.8 MCG/ACT Aero Generic drug: Budeson-Glycopyrrol-Formoterol Inhale two puffs twice daily to prevent cough or wheeze.  Rinse, gargle, and spit after use.   Eliquis 5 MG Tabs tablet Generic drug: apixaban TAKE 1 TABLET BY MOUTH TWICE A DAY   famotidine 40 MG tablet Commonly known as: PEPCID Take one tablet one  to two times daily as directed.   finasteride 5 MG tablet Commonly known as: PROSCAR Take 5 mg by mouth daily.   metoprolol tartrate 25 MG tablet Commonly known as: LOPRESSOR TAKE 1 TABLET BY MOUTH TWICE A DAY   PRESERVISION AREDS 2 PO Take by mouth daily.   triamcinolone 55 MCG/ACT Aero nasal inhaler Commonly known as: NASACORT Place 2 sprays into the nose at bedtime.   Ventolin HFA 108 (90 Base) MCG/ACT inhaler Generic drug: albuterol Inhale 2 puffs into the lungs as needed for wheezing or shortness of breath.   vitamin C 1000 MG tablet Take 1,000 mg by mouth daily.   VITAMIN D3 PO Take 1 tablet by mouth daily.    Past Medical History:  Diagnosis Date   Acute coronary syndrome ([place]) 09/28/2019   Arthritis 01/15/2017   Atrial fibrillation (HCC)    Cancer (HCC)    skin cancer   Chest mass 12/04/2015   Chronic hyponatremia 02/04/2022   Coronary artery disease    Coronary artery disease of native artery of native heart with stable angina pectoris ([place])    COVID-19 virus infection 01/30/2020   Cutaneous abscess of chest wall 12/04/2015   Dyslipidemia 02/04/2022   Dyspnea    Dysrhythmia    afib   Encounter for postoperative wound check 02/01/2020   Epidermoid cyst of skin 12/04/2015   GERD (gastroesophageal reflux  disease) 01/15/2017   High risk medication use 01/10/2015   Overview:  Overview:  flecanide   Hyperlipidemia 01/15/2017   Ileus ([place]) 02/04/2022   LAE (left atrial enlargement) 01/15/2017   Open wound of chest wall 04/01/2016   PAF (paroxysmal atrial fibrillation) ([place]) 01/10/2015   Overview:  CHADS2 vasc score=1   Pneumonia due to COVID-19 virus 02/04/2022   Postop check 11/03/2019   S/P CABG x 3 10/04/2019   Troponin I above reference range 02/04/2022    Past Surgical History:  Procedure Laterality Date   APPENDECTOMY     CARDIAC CATHETERIZATION     09/23/19   CHOLECYSTECTOMY     CORONARY ARTERY BYPASS GRAFT N/A 10/04/2019   Procedure: CORONARY ARTERY BYPASS  GRAFTING (CABG) times three using right greater saphenous vein and left internal mammary ;  Surgeon: Ivin Poot, MD;  Location: [facility];  Service: Open Heart Surgery;  Laterality: N/A;   CYST EXCISION     ENDOVEIN HARVEST OF GREATER SAPHENOUS VEIN Right 10/04/2019   Procedure: [name] Of Greater Saphenous Vein;  Surgeon: Ivin Poot, MD;  Location: [facility];  Service: Open Heart Surgery;  Laterality: Right;   EXCISION OF KELOID N/A 10/04/2019   Procedure: Excision Of Sternal Keloid;  Surgeon: Ivin Poot, MD;  Location: [facility];  Service: Open Heart Surgery;  Laterality: N/A;   LAPAROSCOPIC LYSIS INTESTINAL ADHESIONS  2012   LEFT HEART CATH AND CORONARY ANGIOGRAPHY N/A 09/23/2019   Procedure: LEFT HEART CATH AND CORONARY ANGIOGRAPHY;  Surgeon: Belva Crome, MD;  Location: [facility];  Service: Cardiovascular;  Laterality: N/A;   TEE WITHOUT CARDIOVERSION N/A 10/04/2019   Procedure: TRANSESOPHAGEAL ECHOCARDIOGRAM (TEE);  Surgeon: Prescott Gum, Collier Salina, MD;  Location: [facility];  Service: Open Heart Surgery;  Laterality: N/A;    Review of systems negative except as noted in HPI / PMHx or noted below:  Review of Systems  Constitutional: Negative.   HENT: Negative.    Eyes: Negative.   Respiratory: Negative.    Cardiovascular: Negative.   Gastrointestinal: Negative.   Genitourinary: Negative.   Musculoskeletal: Negative.   Skin: Negative.   Neurological: Negative.   Endo/Heme/Allergies: Negative.   Psychiatric/Behavioral: Negative.       Objective:   Vitals:   06/18/22 1046  BP: 120/76  Pulse: (!) 56  Resp: 16  SpO2: 97%          Physical Exam Constitutional:      Appearance: He is not diaphoretic.  HENT:     Head: Normocephalic.     Right Ear: Tympanic membrane, ear canal and external ear normal.     Left Ear: Tympanic membrane, ear canal and external ear normal.     Nose: Nose normal. No mucosal edema or rhinorrhea.     Mouth/Throat:     Pharynx: Uvula  midline. No oropharyngeal exudate.  Eyes:     Conjunctiva/sclera: Conjunctivae normal.  Neck:     Thyroid: No thyromegaly.     Trachea: Trachea normal. No tracheal tenderness or tracheal deviation.  Cardiovascular:     Rate and Rhythm: Normal rate and regular rhythm.     Heart sounds: Normal heart sounds, S1 normal and S2 normal. No murmur heard. Pulmonary:     Effort: No respiratory distress.     Breath sounds: Normal breath sounds. No stridor. No wheezing or rales.  Lymphadenopathy:     Head:     Right side of head: No tonsillar adenopathy.  Left side of head: No tonsillar adenopathy.     Cervical: No cervical adenopathy.  Skin:    Findings: No erythema or rash.     Nails: There is no clubbing.  Neurological:     Mental Status: He is alert.     Diagnostics:    Spirometry was performed and demonstrated an FEV1 of 2.34 at 80 % of predicted.  The patient had an Asthma Control Test with the following results:  .    Assessment and Plan:   1. COPD with asthma   2. Perennial allergic rhinitis   3. LPRD (laryngopharyngeal reflux disease)    1.  Continue to treat and prevent inflammation:   A.  Nasacort- 1-2 sprays each nostril 1 time per day  B.  Breztri - 2 inhalations 1-2 times per day  2. Continue to Treat and prevent reflux:   A. Famotidine 58m tablet 1-2 times per day  3. If needed:   A. Nasal saline  B. OTC antihistamine  C. Albuterol HFA - 2 inhalations every 4-6 hours  4. For this recent event:   A. Consistently use Breztri - 2 times per day  B. Consistently use famotidine 2 times per day  C. Mucinex DM - 2 times per day  D. Loratadine - 2 times per day  E. Nasal saline if needed  5.  Return to clinic in 6 months or earlier if problem  It appears that JArvishas developed a viral respiratory tract infection as has his wife and he should get over this issue within another week.  Will have him use the plan noted above to address his reflux and  inflammation that was preexistent to this event and I do not think he is going to require any additional therapy at this point.  There does not appear to be evidence of a secondary bacterial infection at this point.  Assuming he does well with this plan I will see him back in this clinic in 6 months or earlier if there is a problem.  EAllena Katz MD [facility]

## 2022-06-19 ENCOUNTER — Encounter: Payer: Self-pay | Admitting: Allergy and Immunology

## 2022-07-11 DIAGNOSIS — I4891 Unspecified atrial fibrillation: Secondary | ICD-10-CM | POA: Diagnosis not present

## 2022-07-11 DIAGNOSIS — J329 Chronic sinusitis, unspecified: Secondary | ICD-10-CM | POA: Diagnosis not present

## 2022-07-11 DIAGNOSIS — I25119 Atherosclerotic heart disease of native coronary artery with unspecified angina pectoris: Secondary | ICD-10-CM | POA: Diagnosis not present

## 2022-07-11 DIAGNOSIS — D6869 Other thrombophilia: Secondary | ICD-10-CM | POA: Diagnosis not present

## 2022-07-11 DIAGNOSIS — J40 Bronchitis, not specified as acute or chronic: Secondary | ICD-10-CM | POA: Diagnosis not present

## 2022-07-24 DIAGNOSIS — I110 Hypertensive heart disease with heart failure: Secondary | ICD-10-CM | POA: Diagnosis not present

## 2022-07-24 DIAGNOSIS — M1990 Unspecified osteoarthritis, unspecified site: Secondary | ICD-10-CM | POA: Diagnosis not present

## 2022-07-24 DIAGNOSIS — K219 Gastro-esophageal reflux disease without esophagitis: Secondary | ICD-10-CM | POA: Diagnosis not present

## 2022-07-24 DIAGNOSIS — E663 Overweight: Secondary | ICD-10-CM | POA: Diagnosis not present

## 2022-07-24 DIAGNOSIS — I2510 Atherosclerotic heart disease of native coronary artery without angina pectoris: Secondary | ICD-10-CM | POA: Diagnosis not present

## 2022-07-24 DIAGNOSIS — J449 Chronic obstructive pulmonary disease, unspecified: Secondary | ICD-10-CM | POA: Diagnosis not present

## 2022-07-24 DIAGNOSIS — I4891 Unspecified atrial fibrillation: Secondary | ICD-10-CM | POA: Diagnosis not present

## 2022-07-24 DIAGNOSIS — I509 Heart failure, unspecified: Secondary | ICD-10-CM | POA: Diagnosis not present

## 2022-07-24 DIAGNOSIS — J309 Allergic rhinitis, unspecified: Secondary | ICD-10-CM | POA: Diagnosis not present

## 2022-07-24 DIAGNOSIS — E785 Hyperlipidemia, unspecified: Secondary | ICD-10-CM | POA: Diagnosis not present

## 2022-07-24 DIAGNOSIS — D6869 Other thrombophilia: Secondary | ICD-10-CM | POA: Diagnosis not present

## 2022-07-24 DIAGNOSIS — I10 Essential (primary) hypertension: Secondary | ICD-10-CM | POA: Diagnosis not present

## 2022-07-29 DIAGNOSIS — E782 Mixed hyperlipidemia: Secondary | ICD-10-CM | POA: Diagnosis not present

## 2022-07-29 DIAGNOSIS — D6869 Other thrombophilia: Secondary | ICD-10-CM | POA: Diagnosis not present

## 2022-07-29 DIAGNOSIS — Z951 Presence of aortocoronary bypass graft: Secondary | ICD-10-CM | POA: Diagnosis not present

## 2022-07-29 DIAGNOSIS — Z0000 Encounter for general adult medical examination without abnormal findings: Secondary | ICD-10-CM | POA: Diagnosis not present

## 2022-07-29 DIAGNOSIS — N401 Enlarged prostate with lower urinary tract symptoms: Secondary | ICD-10-CM | POA: Diagnosis not present

## 2022-07-29 DIAGNOSIS — Z8719 Personal history of other diseases of the digestive system: Secondary | ICD-10-CM | POA: Diagnosis not present

## 2022-07-29 DIAGNOSIS — Z79899 Other long term (current) drug therapy: Secondary | ICD-10-CM | POA: Diagnosis not present

## 2022-07-29 DIAGNOSIS — R7301 Impaired fasting glucose: Secondary | ICD-10-CM | POA: Diagnosis not present

## 2022-07-29 DIAGNOSIS — E871 Hypo-osmolality and hyponatremia: Secondary | ICD-10-CM | POA: Diagnosis not present

## 2022-07-29 DIAGNOSIS — N138 Other obstructive and reflux uropathy: Secondary | ICD-10-CM | POA: Diagnosis not present

## 2022-07-29 DIAGNOSIS — I25119 Atherosclerotic heart disease of native coronary artery with unspecified angina pectoris: Secondary | ICD-10-CM | POA: Diagnosis not present

## 2022-07-29 DIAGNOSIS — I4891 Unspecified atrial fibrillation: Secondary | ICD-10-CM | POA: Diagnosis not present

## 2022-08-15 DIAGNOSIS — L820 Inflamed seborrheic keratosis: Secondary | ICD-10-CM | POA: Diagnosis not present

## 2022-08-15 DIAGNOSIS — D485 Neoplasm of uncertain behavior of skin: Secondary | ICD-10-CM | POA: Diagnosis not present

## 2022-09-11 DIAGNOSIS — C44622 Squamous cell carcinoma of skin of right upper limb, including shoulder: Secondary | ICD-10-CM | POA: Diagnosis not present

## 2022-09-26 DIAGNOSIS — I4891 Unspecified atrial fibrillation: Secondary | ICD-10-CM | POA: Diagnosis not present

## 2022-09-26 DIAGNOSIS — E782 Mixed hyperlipidemia: Secondary | ICD-10-CM | POA: Diagnosis not present

## 2022-09-26 DIAGNOSIS — D6869 Other thrombophilia: Secondary | ICD-10-CM | POA: Diagnosis not present

## 2022-10-01 DIAGNOSIS — C44629 Squamous cell carcinoma of skin of left upper limb, including shoulder: Secondary | ICD-10-CM | POA: Diagnosis not present

## 2022-10-02 ENCOUNTER — Other Ambulatory Visit: Payer: Self-pay | Admitting: Allergy and Immunology

## 2022-10-08 DIAGNOSIS — H40012 Open angle with borderline findings, low risk, left eye: Secondary | ICD-10-CM | POA: Diagnosis not present

## 2022-10-08 DIAGNOSIS — D3132 Benign neoplasm of left choroid: Secondary | ICD-10-CM | POA: Diagnosis not present

## 2022-10-09 ENCOUNTER — Ambulatory Visit: Payer: Medicare Other | Admitting: Allergy and Immunology

## 2022-10-11 ENCOUNTER — Other Ambulatory Visit: Payer: Self-pay | Admitting: Cardiology

## 2022-10-13 NOTE — Telephone Encounter (Signed)
Prescription refill request for Eliquis received. Indication: PAF Last office visit: 02/06/22  Caryl Pina MD Scr: 0.94 on 02/06/22  Epic Age: 77 Weight: 97.5kg  Based on above findings Eliquis 5mg  twice daily is the appropriate dose.  Refill approved.

## 2022-10-23 ENCOUNTER — Other Ambulatory Visit: Payer: Self-pay | Admitting: Cardiology

## 2022-12-10 NOTE — Progress Notes (Unsigned)
Cardiology Office Note:    Date:  12/11/2022   ID:  Steve Frye, DOB 10-08-1945, MRN [id number]  PCP:  Street, Stephanie Coup, MD  Cardiologist:  Norman Herrlich, MD    Referring MD: [address] Stephanie Coup, *    ASSESSMENT:    1. Paroxysmal atrial fibrillation (HCC)   2. On amiodarone therapy   3. Chronic anticoagulation   4. Coronary artery disease of native artery of native heart with stable angina pectoris (HCC)   5. Mixed hyperlipidemia    PLAN:    In order of problems listed above:  He continues to do well maintaining sinus rhythm on low-dose amiodarone and continue his current anticoagulant I placed future labs for October to screen him for thyroid and liver toxicity Stable CAD having no anginal discomfort continue his current treatment including low-dose beta-blocker and statin Continue his high intensity statin LDL is at target Check B12 level with paresthesias lower extremity I do not think that this is amiodarone neurotoxicity   Next appointment: 9 months   Medication Adjustments/Labs and Tests Ordered: Current medicines are reviewed at length with the patient today.  Concerns regarding medicines are outlined above.  Orders Placed This Encounter  Procedures   EKG 12-Lead   No orders of the defined types were placed in this encounter.    History of Present Illness:    Steve Frye is a 77 y.o. male with a hx of paroxysmal atrial fibrillation maintaining sinus rhythm on low-dose amiodarone chronic anticoagulation CAD with CABG and hyperlipidemia last seen 02/06/2022.  Compliance with diet, lifestyle and medications: Yes  Has a smart watch she has had no high or low rate or atrial fibrillation alerts He feels well he is having no cardiovascular symptoms of edema shortness of breath orthopnea chest pain palpitation or syncope He tolerates his amiodarone without side effects and anticoagulant without bleeding complication Recent lab 07/29/2022 cholesterol 141 LDL 83  triglycerides 88 A1c 5.6 hemoglobin 13.5 creatinine 0.9 potassium 4.3  His only complaint is paresthesias bilateral lower extremity, B12 level be drawn today Past Medical History:  Diagnosis Date   Acute coronary syndrome (HCC) 09/28/2019   Arthritis 01/15/2017   Atrial fibrillation (HCC)    Cancer (HCC)    skin cancer   Chest mass 12/04/2015   Chronic hyponatremia 02/04/2022   Coronary artery disease    Coronary artery disease of native artery of native heart with stable angina pectoris (HCC)    COVID-19 virus infection 01/30/2020   Cutaneous abscess of chest wall 12/04/2015   Dyslipidemia 02/04/2022   Dyspnea    Dysrhythmia    afib   Encounter for postoperative wound check 02/01/2020   Epidermoid cyst of skin 12/04/2015   GERD (gastroesophageal reflux disease) 01/15/2017   High risk medication use 01/10/2015   Overview:  Overview:  flecanide   Hyperlipidemia 01/15/2017   Ileus (HCC) 02/04/2022   LAE (left atrial enlargement) 01/15/2017   Open wound of chest wall 04/01/2016   PAF (paroxysmal atrial fibrillation) (HCC) 01/10/2015   Overview:  CHADS2 vasc score=1   Pneumonia due to COVID-19 virus 02/04/2022   Postop check 11/03/2019   S/P CABG x 3 10/04/2019   Troponin I above reference range 02/04/2022    Current Medications: Current Meds  Medication Sig   amiodarone (PACERONE) 200 MG tablet TAKE 1 TABLET BY MOUTH EVERY DAY   Ascorbic Acid (VITAMIN C) 1000 MG tablet Take 1,000 mg by mouth daily.   atorvastatin (LIPITOR) 40 MG tablet Take 40 mg by  mouth at bedtime.   BREZTRI AEROSPHERE 160-9-4.8 MCG/ACT AERO Inhale two puffs twice daily to prevent cough or wheeze.  Rinse, gargle, and spit after use.   Cholecalciferol (VITAMIN D3 PO) Take 1 tablet by mouth daily.   ELIQUIS 5 MG TABS tablet TAKE 1 TABLET BY MOUTH TWICE A DAY   famotidine (PEPCID) 40 MG tablet TAKE 1 TABLET BY MOUTH EVERY DAY   finasteride (PROSCAR) 5 MG tablet Take 5 mg by mouth daily.   metoprolol tartrate (LOPRESSOR) 25 MG  tablet Take 1 tablet (25 mg total) by mouth 2 (two) times daily.   Multiple Vitamins-Minerals (PRESERVISION AREDS 2 PO) Take by mouth daily.   Probiotic Product (ALIGN PO) Take 1 tablet by mouth daily.   triamcinolone (NASACORT) 55 MCG/ACT AERO nasal inhaler Place 2 sprays into the nose at bedtime.   VENTOLIN HFA 108 (90 Base) MCG/ACT inhaler Inhale 2 puffs into the lungs as needed for wheezing or shortness of breath.      EKGs/Labs/Other Studies Reviewed:    The following studies were reviewed today:  Cardiac Studies & Procedures   CARDIAC CATHETERIZATION  CARDIAC CATHETERIZATION 09/23/2019  Narrative  75% left main stenosis distally.  50% proximal/ostial with catheter damping.  80% proximal LAD before origin of large diagonal.  Patent circumflex.  The circumflex gives origin to 2 large obtuse marginal branches.  RCA is not well visualized due to inability to selectively engage.  There is ostial narrowing that is significant.  Distal vessel never well seen.  The vessel should be grafted distally if there are acceptable branches.  Normal left ventricular function.  Normal LV end-diastolic pressure.  RECOMMENDATIONS:   TCTS consultation for early CABG.  Nitroglycerin will be prescribed  Continue aspirin and beta-blocker therapy.  Cannot therapy until after CABG.  Findings Coronary Findings Diagnostic  Dominance: Right  Left Main Ost LM lesion is 50% stenosed. Mid LM to Dist LM lesion is 75% stenosed.  Left Anterior Descending Prox LAD lesion is 85% stenosed.  Right Coronary Artery Ost RCA lesion is 80% stenosed.  Intervention  No interventions have been documented.   STRESS TESTS  MYOCARDIAL PERFUSION IMAGING 08/02/2020  Narrative  Nuclear stress EF: 65%.  There was no ST segment deviation noted during stress.  The study is normal.  This is a low risk study.  The left ventricular ejection fraction is normal (55-65%).    ECHOCARDIOGRAM  ECHOCARDIOGRAM COMPLETE 08/15/2019  Narrative ECHOCARDIOGRAM REPORT    Patient Name:   Steve Frye Date of Exam: 08/15/2019 Medical Rec #:  [id number]   Height:       72.0 in Accession #:    [id number]  Weight:       220.0 lb Date of Birth:  1945/08/30   BSA:          2.219 m Patient Age:    77 years    BP:           138/69 mmHg Patient Gender: M           HR:           56 bpm. Exam Location:  [facility]  Procedure: 2D Echo, Color Doppler and Cardiac Doppler  Indications:    R06.9 DOE  History:        Patient has no prior history of Echocardiogram examinations. Arrythmias:Atrial Fibrillation; Risk Factors:Dyslipidemia.  Sonographer:    Irving Burton Senior RDCS Referring Phys: [phone]  J   IMPRESSIONS   1. Left ventricular ejection fraction, by estimation, is  60 to 65%. The left ventricle has normal function. The left ventricle has no regional wall motion abnormalities. There is mild left ventricular hypertrophy. 2. Right ventricular systolic function is normal. The right ventricular size is normal. There is moderately elevated pulmonary artery systolic pressure. 3. Left atrial size was mildly dilated. 4. The mitral valve is normal in structure. Mild mitral valve regurgitation. No evidence of mitral stenosis.  FINDINGS Left Ventricle: Left ventricular ejection fraction, by estimation, is 60 to 65%. The left ventricle has normal function. The left ventricle has no regional wall motion abnormalities. The left ventricular internal cavity size was normal in size. There is mild left ventricular hypertrophy. Left ventricular diastolic parameters are consistent with Grade II diastolic dysfunction (pseudonormalization).  Right Ventricle: The right ventricular size is normal. No increase in right ventricular wall thickness. Right ventricular systolic function is normal. There is moderately elevated pulmonary artery systolic pressure. The tricuspid regurgitant velocity  is 2.89 m/s, and with an assumed right atrial pressure of 15 mmHg, the estimated right ventricular systolic pressure is 48.4 mmHg.  Left Atrium: Left atrial size was mildly dilated.  Right Atrium: Right atrial size was normal in size.  Pericardium: There is no evidence of pericardial effusion.  Mitral Valve: The mitral valve is normal in structure. Normal mobility of the mitral valve leaflets. Mild mitral valve regurgitation. No evidence of mitral valve stenosis.  Tricuspid Valve: The tricuspid valve is normal in structure. Tricuspid valve regurgitation is mild . No evidence of tricuspid stenosis.  Aortic Valve: The aortic valve is normal in structure. Aortic valve regurgitation is not visualized. No aortic stenosis is present.  Pulmonic Valve: The pulmonic valve was normal in structure. Pulmonic valve regurgitation is not visualized. No evidence of pulmonic stenosis.  Aorta: The aortic root is normal in size and structure.  Venous: The inferior vena cava is normal in size with greater than 50% respiratory variability, suggesting right atrial pressure of 3 mmHg.  IAS/Shunts: No atrial level shunt detected by color flow Doppler.   LEFT VENTRICLE PLAX 2D LVIDd:         4.32 cm  Diastology LVIDs:         3.41 cm  LV e' lateral:   9.46 cm/s LV PW:         0.85 cm  LV E/e' lateral: 8.5 LV IVS:        1.34 cm  LV e' medial:    7.83 cm/s LVOT diam:     2.20 cm  LV E/e' medial:  10.3 LV SV:         86 LV SV Index:   39 LVOT Area:     3.80 cm   RIGHT VENTRICLE RV S prime:     11.50 cm/s TAPSE (M-mode): 2.2 cm  LEFT ATRIUM             Index       RIGHT ATRIUM           Index LA diam:        4.10 cm 1.85 cm/m  RA Area:     20.60 cm LA Vol (A2C):   65.1 ml 29.34 ml/m RA Volume:   58.30 ml  26.28 ml/m LA Vol (A4C):   56.7 ml 25.56 ml/m LA Biplane Vol: 60.6 ml 27.31 ml/m AORTIC VALVE LVOT Vmax:   90.90 cm/s LVOT Vmean:  63.400 cm/s LVOT VTI:    0.225 m  AORTA Ao Root diam:  3.50 cm Ao Asc diam:  3.50 cm  MITRAL VALVE               TRICUSPID VALVE MV Area (PHT): 2.95 cm    TR Peak grad:   33.4 mmHg MV Decel Time: 257 msec    TR Vmax:        289.00 cm/s MV E velocity: 80.50 cm/s MV A velocity: 84.40 cm/s  SHUNTS MV E/A ratio:  0.95        Systemic VTI:  0.22 m Systemic Diam: 2.20 cm  Belva Crome MD Electronically signed by Belva Crome MD Signature Date/Time: 08/15/2019/12:23:53 PM    Final   TEE  ECHO INTRAOPERATIVE TEE 10/04/2019  Narrative *INTRAOPERATIVE TRANSESOPHAGEAL REPORT *    Patient Name:   Steve Frye    Date of Exam: 10/04/2019 Medical Rec #:  [id number]      Height:       72.0 in Accession #:    [id number]     Weight:       213.6 lb Date of Birth:  [birth year]      BSA:          2.19 m Patient Age:    77 years       BP:           148/61 mmHg Patient Gender: M              HR:           62 bpm. Exam Location:  Anesthesiology  Transesophogeal exam was perform intraoperatively during surgical procedure. Patient was closely monitored under general anesthesia during the entirety of examination.  Indications:     CAD Performing Phys: [id number] PETER VAN TRIGT  Complications: No known complications during this procedure. PRE-OP FINDINGS Left Ventricle: The left ventricle has normal systolic function, with an ejection fraction of 60-65%. The cavity size was normal. There is no increase in left ventricular wall thickness. On the post-bypass exam, there was normal LV systolic function which appeared unchanged from the pre-bypass study. There were no regional wall motion abnormailties.  Right Ventricle: The right ventricle has normal systolic function. The cavity was normal. There is no increase in right ventricular wall thickness. On the post-bypass exam, the RV systolic function appeared normal.  Left Atrium: The left atrium was enlarged and measured 5.0 cm in the medial-lateral dimension. There was no thrombus seen in the LA cavity or LA  appendage. The LAA emptying velocity was normal.  Right Atrium: Right atrial size was dilated.  Interatrial Septum: No atrial level shunt detected by color flow Doppler.  Pericardium: There is no evidence of pericardial effusion.  Mitral Valve: The mitral valve is normal in structure. No thickening of the mitral valve leaflet. Mitral valve regurgitation is trivial by color flow Doppler. There is No evidence of mitral stenosis.  Tricuspid Valve: The Tricuspid valve appeared structurally normal. The annulus measured 3.6 cm in diameter in the 4 chamber view. There was mild tricuspid regurgitaion.  Aortic Valve: The aortic valve is tricuspid Aortic valve regurgitation was not visualized by color flow Doppler. There is no evidence of aortic valve stenosis. There is no evidence of a vegetation on the aortic valve.  Pulmonic Valve: The pulmonic valve was normal in structure, with normal leaflet motion. Pulmonic valve regurgitation is trivial by color flow Doppler.   Aorta: The aortic root and proximal ascending aorta were normal in size with a well defiined aortic root and sino-tubular junction without effacement. There was normal appearing wall thickness.  The descending aorta was normal in diameter with grade 2 -3 atheromatous disease present. There was intimal thickening with atheromatous plaques measuring less than 0.4 cm in diameter.  +--------------+-------++ LEFT VENTRICLE        +--------------+-------++ PLAX 2D               +--------------+-------++ LVIDd:        4.00 cm +--------------+-------++ LVIDs:        2.60 cm +--------------+-------++ LV SV:        45 ml   +--------------+-------++ LV SV Index:  20.28   +--------------+-------++                       +--------------+-------++  +------------------+---------++ LV Volumes (MOD)            +------------------+---------++ LV area d, A2C:   27.60  cm +------------------+---------++ LV area d, A4C:   29.60 cm +------------------+---------++ LV area s, A2C:   14.60 cm +------------------+---------++ LV major d, A2C:  8.04 cm   +------------------+---------++ LV major d, A4C:  7.06 cm   +------------------+---------++ LV major s, A2C:  6.76 cm   +------------------+---------++ LV vol d, MOD A2C:78.2 ml   +------------------+---------++ LV vol d, MOD A4C:102.0 ml  +------------------+---------++ LV vol s, MOD A2C:27.2 ml   +------------------+---------++ LV SV MOD A2C:    51.0 ml   +------------------+---------++ LV SV MOD A4C:    102.0 ml  +------------------+---------++   Kipp Brood MD Electronically signed by Kipp Brood MD Signature Date/Time: 10/04/2019/5:16:47 PM    Final   MONITORS  LONG TERM MONITOR (3-14 DAYS) 08/24/2019  Narrative A ZIO monitor was used for 6 days 23 hours beginning 08/11/2019 to assess for paroxysmal atrial fibrillation in a patient taking flecainide.  The cardiac rhythm throughout was sinus with average, minimum and maximum heart rates of 62, 47 and 98 bpm.  There were 3 triggered and 3 diary events all associated with sinus rhythm.  Ventricular ectopy was rare with isolated PVCs.  Supraventricular ectopy was rare there were 2 brief runs of atrial premature contractions the longest 7 beats in duration at a rate of 133 bpm.  There were no episodes of atrial fibrillation or flutter.  There were no pauses of 3 seconds or greater and no episodes of second or third-degree AV nodal or sinus node exit block   Conclusion, unremarkable 7-day ZIO monitor with no recurrence of atrial fibrillation.   CT SCANS  CT CORONARY MORPH W/CTA COR W/SCORE 09/16/2019  Addendum 09/19/2019  3:17 PM ADDENDUM REPORT: 09/19/2019 15:15  EXAM: OVER-READ INTERPRETATION  CT CHEST  The following report is an over-read performed by radiologist Dr. Maryelizabeth Rowan [facility] on 09/19/2019. This over-read does not include interpretation of cardiac or coronary anatomy or pathology. The coronary CTA interpretation by the cardiologist is attached.  COMPARISON:  None.  FINDINGS: Limited view of the lung parenchyma demonstrates pleural-parenchymal thickening at the RIGHT lung base measuring 1.9 cm (image 28/12). Airways are normal.  Limited view of the mediastinum demonstrates no adenopathy. Esophagus normal.  Limited view of the upper abdomen unremarkable.  Limited view of the skeleton and chest wall is unremarkable.  IMPRESSION: Focus of pleuroparenchymal thickening at the RIGHT lung base. Recommend follow-up CT in 3 months to evaluate for persistence.  These results will be called to the ordering clinician or representative by the Radiologist Assistant, and communication documented in the PACS or [employer].   Electronically Signed By: Loura Halt.D.  On: 09/19/2019 15:15  Narrative CLINICAL DATA:  Chest pain  EXAM: Cardiac CTA  MEDICATIONS: Sub lingual nitro. 4mg  x 2  TECHNIQUE: The patient was scanned on a Siemens 192 slice scanner. Gantry rotation speed was 250 msecs. Collimation was 0.6 mm. A 100 kV prospective scan was triggered in the ascending thoracic aorta at 35-75% of the R-R interval. Average HR during the scan was 60 bpm. The 3D data set was interpreted on a dedicated work station using MPR, MIP and VRT modes. A total of 80cc of contrast was used.  FINDINGS: Non-cardiac: See separate report from [facility].  Pulmonary veins drain normally to the left atrium.  Calcium Score: 427 Agatston units.  Coronary Arteries: Right dominant with no anomalies  LM: Proximal left main with extensive mixed plaque, concern for severe (70-90%) stenosis. The distal left main has a small aneurysmal segment.  LAD system: Mixed plaque proximal LAD, suspect moderate (51-69%) stenosis.  Moderate D1 with proximal mixed plaque, suspect mild (<50%) stenosis.  Circumflex system: Small ramus without significant disease. Mixed plaque proximal LCx, possible moderate (51-69%) stenosis. Calcified plaque distal LCx, cannot rule out moderate (51-69%) stenosis. Moderate OM2, mixed plaque with moderate (51-69%) stenosis.  RCA system: Mixed plaque ostial RCA, possible severe (70-90%) stenosis.  IMPRESSION: 1. Coronary artery calcium score 427 Agatston units. This places the patient in the 63rd percentile for age and gender, suggesting intermediate risk for future cardiac events.  2.  Suspected severe ostial RCA stenosis.  3. Extensive left main plaque, concern for severe stenosis proximally with a small aneurysmal area distally.  4.  Suspect moderate stenosis within the LCx system.  5.  Moderate stenosis proximal LAD.  Calcified plaque may lead to over-estimation of disease, but concerning study. Result called to Dr. Dulce Sellar. Will send for FFR.  [employer] account executive  Electronically Signed: By: Marca Ancona M.D. On: 09/16/2019 17:16              Recent Labs: 02/06/2022: ALT 33; BUN 10; Creatinine, Ser 0.94; Potassium 4.3; Sodium 139; TSH 1.130  Recent Lipid Panel    Component Value Date/Time   CHOL 146 02/06/2022 0828   TRIG 79 02/06/2022 0828   HDL 48 02/06/2022 0828   CHOLHDL 3.0 02/06/2022 0828   LDLCALC 83 02/06/2022 0828    Physical Exam:    VS:  BP 118/60 (BP Location: Right Arm, Patient Position: Sitting, Cuff Size: Normal)   Pulse (!) 50   Ht 6' (1.829 m)   Wt 216 lb 3.2 oz (98.1 kg)   SpO2 99%   BMI 29.32 kg/m     Wt Readings from Last 3 Encounters:  12/11/22 216 lb 3.2 oz (98.1 kg)  04/09/22 221 lb (100.2 kg)  02/06/22 215 lb (97.5 kg)     GEN:  Well nourished, well developed in no acute distress he has a very prominent sternal keloid HEENT: Normal NECK: No JVD; No carotid bruits LYMPHATICS: No lymphadenopathy CARDIAC: RRR, no murmurs,  rubs, gallops RESPIRATORY:  Clear to auscultation without rales, wheezing or rhonchi  ABDOMEN: Soft, non-tender, non-distended MUSCULOSKELETAL:  No edema; No deformity  SKIN: Warm and dry NEUROLOGIC:  Alert and oriented x 3 PSYCHIATRIC:  Normal affect    Signed, Norman Herrlich, MD  12/11/2022 9:56 AM    [facility]

## 2022-12-11 ENCOUNTER — Ambulatory Visit: Payer: PPO | Attending: Cardiology | Admitting: Cardiology

## 2022-12-11 ENCOUNTER — Encounter: Payer: Self-pay | Admitting: Cardiology

## 2022-12-11 VITALS — BP 118/60 | HR 50 | Ht 72.0 in | Wt 216.2 lb

## 2022-12-11 DIAGNOSIS — Z7901 Long term (current) use of anticoagulants: Secondary | ICD-10-CM | POA: Diagnosis not present

## 2022-12-11 DIAGNOSIS — Z79899 Other long term (current) drug therapy: Secondary | ICD-10-CM | POA: Diagnosis not present

## 2022-12-11 DIAGNOSIS — I480 Paroxysmal atrial fibrillation: Secondary | ICD-10-CM | POA: Diagnosis not present

## 2022-12-11 DIAGNOSIS — E782 Mixed hyperlipidemia: Secondary | ICD-10-CM | POA: Diagnosis not present

## 2022-12-11 DIAGNOSIS — I25118 Atherosclerotic heart disease of native coronary artery with other forms of angina pectoris: Secondary | ICD-10-CM

## 2022-12-11 NOTE — Addendum Note (Signed)
Addended by: Roxanne Mins I on: 12/11/2022 10:09 AM   Modules accepted: Orders

## 2022-12-11 NOTE — Addendum Note (Signed)
Addended by: Roxanne Mins I on: 12/11/2022 10:19 AM   Modules accepted: Orders

## 2022-12-11 NOTE — Patient Instructions (Signed)
Medication Instructions:  Your physician recommends that you continue on your current medications as directed. Please refer to the Current Medication list given to you today.  *If you need a refill on your cardiac medications before your next appointment, please call your pharmacy*   Lab Work: Your physician recommends that you return for lab work in:   Labs today: Vitamin B 12 Labs in October: CMP, Lipids, TSH T3 T4  If you have labs (blood work) drawn today and your tests are completely normal, you will receive your results only by: MyChart Message (if you have MyChart) OR A paper copy in the mail If you have any lab test that is abnormal or we need to change your treatment, we will call you to review the results.   Testing/Procedures: None   Follow-Up: At [facility], you and your health needs are our priority.  As part of our continuing mission to provide you with exceptional heart care, we have created designated Provider Care Teams.  These Care Teams include your primary Cardiologist (physician) and Advanced Practice Providers (APPs -  Physician Assistants and Nurse Practitioners) who all work together to provide you with the care you need, when you need it.  We recommend signing up for the patient portal called "MyChart".  Sign up information is provided on this After Visit Summary.  MyChart is used to connect with patients for Virtual Visits (Telemedicine).  Patients are able to view lab/test results, encounter notes, upcoming appointments, etc.  Non-urgent messages can be sent to your provider as well.   To learn more about what you can do with MyChart, go to [email or web address].    Your next appointment:   9 month(s)  Provider:   Norman Herrlich, MD    Other Instructions None

## 2022-12-12 LAB — VITAMIN B12: Vitamin B-12: 710 pg/mL (ref 232–1245)

## 2022-12-17 ENCOUNTER — Ambulatory Visit: Payer: PPO | Admitting: Allergy and Immunology

## 2022-12-17 ENCOUNTER — Encounter: Payer: Self-pay | Admitting: Allergy and Immunology

## 2022-12-17 VITALS — BP 122/78 | HR 54 | Resp 18

## 2022-12-17 DIAGNOSIS — J3089 Other allergic rhinitis: Secondary | ICD-10-CM | POA: Diagnosis not present

## 2022-12-17 DIAGNOSIS — K219 Gastro-esophageal reflux disease without esophagitis: Secondary | ICD-10-CM

## 2022-12-17 DIAGNOSIS — J4540 Moderate persistent asthma, uncomplicated: Secondary | ICD-10-CM

## 2022-12-17 NOTE — Progress Notes (Unsigned)
[place] - [place] - [place] - [place] - [place]   Follow-up Note  Referring Provider: Street, Steve Frye, * Primary Provider: Street, Steve Coup, MD Date of Office Visit: 12/17/2022  Subjective:   Steve Frye (DOB: 06/09/1945) is a 77 y.o. male who [facility] in re-evaluation of the following:  HPI: Steve Frye returns to this clinic in evaluation of asthma, rhinitis, LPR.  I last saw him in this clinic 18 June 2022.  He has had an excellent interval of time regarding his lower airways without the need for systemic steroid to treat an exacerbation of asthma and rare use of a short acting bronchodilator while he continues on [place] mostly 1 time per day.  His nose is doing very well while using Nasacort.  He might be a little stuffy in the morning but as soon as he uses his Nasacort everything clears out.  He has not required an antibiotic to treat an episode of sinusitis.  His reflux is under excellent control as is his throat issue while using famotidine just 1 time per day.  Allergies as of 12/17/2022       Reactions   Penicillins Hives   In his 20's        Medication List    ALIGN PO Take 1 tablet by mouth daily.   amiodarone 200 MG tablet Commonly known as: PACERONE TAKE 1 TABLET BY MOUTH EVERY DAY   atorvastatin 40 MG tablet Commonly known as: LIPITOR Take 40 mg by mouth at bedtime.   Breztri Aerosphere 160-9-4.8 MCG/ACT Aero Generic drug: Budeson-Glycopyrrol-Formoterol Inhale two puffs twice daily to prevent cough or wheeze.  Rinse, gargle, and spit after use.   Eliquis 5 MG Tabs tablet Generic drug: apixaban TAKE 1 TABLET BY MOUTH TWICE A DAY   famotidine 40 MG tablet Commonly known as: PEPCID TAKE 1 TABLET BY MOUTH EVERY DAY   finasteride 5 MG tablet Commonly known as: PROSCAR Take 5 mg by mouth daily.   metoprolol tartrate 25 MG tablet Commonly known as: LOPRESSOR Take 1 tablet (25 mg  total) by mouth 2 (two) times daily.   PRESERVISION AREDS 2 PO Take by mouth daily.   triamcinolone 55 MCG/ACT Aero nasal inhaler Commonly known as: NASACORT Place 2 sprays into the nose at bedtime.   Ventolin HFA 108 (90 Base) MCG/ACT inhaler Generic drug: albuterol Inhale 2 puffs into the lungs as needed for wheezing or shortness of breath.   vitamin C 1000 MG tablet Take 1,000 mg by mouth daily.   VITAMIN D3 PO Take 1 tablet by mouth daily.    Past Medical History:  Diagnosis Date   Acute coronary syndrome (HCC) 09/28/2019   Arthritis 01/15/2017   Atrial fibrillation (HCC)    Cancer (HCC)    skin cancer   Chest mass 12/04/2015   Chronic hyponatremia 02/04/2022   Coronary artery disease    Coronary artery disease of native artery of native heart with stable angina pectoris (HCC)    COVID-19 virus infection 01/30/2020   Cutaneous abscess of chest wall 12/04/2015   Dyslipidemia 02/04/2022   Dyspnea    Dysrhythmia    afib   Encounter for postoperative wound check 02/01/2020   Epidermoid cyst of skin 12/04/2015   GERD (gastroesophageal reflux disease) 01/15/2017   High risk medication use 01/10/2015   Overview:  Overview:  flecanide   Hyperlipidemia 01/15/2017   Ileus (HCC) 02/04/2022   LAE (left atrial enlargement) 01/15/2017  Open wound of chest wall 04/01/2016   PAF (paroxysmal atrial fibrillation) (HCC) 01/10/2015   Overview:  CHADS2 vasc score=1   Pneumonia due to COVID-19 virus 02/04/2022   Postop check 11/03/2019   S/P CABG x 3 10/04/2019   Troponin I above reference range 02/04/2022    Past Surgical History:  Procedure Laterality Date   APPENDECTOMY     CARDIAC CATHETERIZATION     09/23/19   CHOLECYSTECTOMY     CORONARY ARTERY BYPASS GRAFT N/A 10/04/2019   Procedure: CORONARY ARTERY BYPASS GRAFTING (CABG) times three using right greater saphenous vein and left internal mammary ;  Surgeon: Kerin Perna, MD;  Location: [facility] OR;  Service: Open Heart Surgery;  Laterality:  N/A;   CYST EXCISION     ENDOVEIN HARVEST OF GREATER SAPHENOUS VEIN Right 10/04/2019   Procedure: Mack Guise Of Greater Saphenous Vein;  Surgeon: Kerin Perna, MD;  Location: [facility] OR;  Service: Open Heart Surgery;  Laterality: Right;   EXCISION OF KELOID N/A 10/04/2019   Procedure: Excision Of Sternal Keloid;  Surgeon: Kerin Perna, MD;  [facility];  Service: Open Heart Surgery;  Laterality: N/A;   LAPAROSCOPIC LYSIS INTESTINAL ADHESIONS  2012   LEFT HEART CATH AND CORONARY ANGIOGRAPHY N/A 09/23/2019   Procedure: LEFT HEART CATH AND CORONARY ANGIOGRAPHY;  Surgeon: Lyn Records, MD;  [facility] INV[facility];  Service: Cardiovascular;  Laterality: N/A;   TEE WITHOUT CARDIOVERSION N/A 10/04/2019   Procedure: TRANSESOPHAGEAL ECHOCARDIOGRAM (TEE);  Surgeon: Donata Clay, Theron Arista, MD;  Location: [facility] OR;  Service: Open Heart Surgery;  Laterality: N/A;    Review of systems negative except as noted in HPI / PMHx or noted below:  Review of Systems  Constitutional: Negative.   HENT: Negative.    Eyes: Negative.   Respiratory: Negative.    Cardiovascular: Negative.   Gastrointestinal: Negative.   Genitourinary: Negative.   Musculoskeletal: Negative.   Skin: Negative.   Neurological: Negative.   Endo/Heme/Allergies: Negative.   Psychiatric/Behavioral: Negative.       Objective:   Vitals:   12/17/22 1050  BP: 122/78  Pulse: (!) 54  Resp: 18  SpO2: 98%          Physical Exam Constitutional:      Appearance: He is not diaphoretic.  HENT:     Head: Normocephalic.     Right Ear: Tympanic membrane, ear canal and external ear normal.     Left Ear: Tympanic membrane, ear canal and external ear normal.     Nose: Nose normal. No mucosal edema or rhinorrhea.     Mouth/Throat:     Pharynx: Uvula midline. No oropharyngeal exudate.  Eyes:     Conjunctiva/sclera: Conjunctivae normal.  Neck:     Thyroid: No thyromegaly.     Trachea: Trachea normal. No tracheal tenderness or  tracheal deviation.  Cardiovascular:     Rate and Rhythm: Normal rate and regular rhythm.     Heart sounds: Normal heart sounds, S1 normal and S2 normal. No murmur heard. Pulmonary:     Effort: No respiratory distress.     Breath sounds: Normal breath sounds. No stridor. No wheezing or rales.  Lymphadenopathy:     Head:     Right side of head: No tonsillar adenopathy.     Left side of head: No tonsillar adenopathy.     Cervical: No cervical adenopathy.  Skin:    Findings: No erythema or rash.     Nails: There is no  clubbing.  Neurological:     Mental Status: He is alert.     Diagnostics: Spirometry was performed and demonstrated an FEV1 of 2.65 at 90 % of predicted.  Assessment and Plan:   1. Asthma, moderate persistent, well-controlled   2. Perennial allergic rhinitis   3. LPRD (laryngopharyngeal reflux disease)     1.  Continue to treat and prevent inflammation:   A.  Nasacort - 1 spray each nostril 1-2 time per day  B.  Breztri - 2 inhalations 1-2 times per day  2. Continue to Treat and prevent reflux:   A. Famotidine 40mg  tablet 1-2 times per day  3. If needed:   A. Nasal saline  B. OTC antihistamine  C. Albuterol HFA - 2 inhalations every 4-6 hours  4. Plan for fall flu vaccine  5.  Return to clinic in 6 months or earlier if problem  Alvino Chapel appears to be doing very well while utilizing anti-inflammatory agents for both his upper and lower airway and also addressing the issue of her reflux trigger with a H2 receptor blocker.  He has a very good understanding of his disease state and how his medications work and appropriate dosing of his medications depending on disease activity.  And he will continue on this plan and we will see him back in this clinic in 6 months or earlier if there is a problem.  Laurette Schimke, MD Allergy / Immunology [facility]

## 2022-12-17 NOTE — Patient Instructions (Signed)
  1.  Continue to treat and prevent inflammation:   A.  Nasacort - 1 spray each nostril 1-2 time per day  B.  Breztri - 2 inhalations 1-2 times per day  2. Continue to Treat and prevent reflux:   A. Famotidine 40mg  tablet 1-2 times per day  3. If needed:   A. Nasal saline  B. OTC antihistamine  C. Albuterol HFA - 2 inhalations every 4-6 hours  4. Plan for fall flu vaccine  5.  Return to clinic in 6 months or earlier if problem

## 2022-12-18 ENCOUNTER — Encounter: Payer: Self-pay | Admitting: Allergy and Immunology

## 2023-01-02 DIAGNOSIS — D485 Neoplasm of uncertain behavior of skin: Secondary | ICD-10-CM | POA: Diagnosis not present

## 2023-01-02 DIAGNOSIS — L570 Actinic keratosis: Secondary | ICD-10-CM | POA: Diagnosis not present

## 2023-01-10 ENCOUNTER — Other Ambulatory Visit: Payer: Self-pay | Admitting: Cardiology

## 2023-01-29 DIAGNOSIS — J449 Chronic obstructive pulmonary disease, unspecified: Secondary | ICD-10-CM | POA: Diagnosis not present

## 2023-01-29 DIAGNOSIS — D6869 Other thrombophilia: Secondary | ICD-10-CM | POA: Diagnosis not present

## 2023-01-29 DIAGNOSIS — N400 Enlarged prostate without lower urinary tract symptoms: Secondary | ICD-10-CM | POA: Diagnosis not present

## 2023-01-29 DIAGNOSIS — I2510 Atherosclerotic heart disease of native coronary artery without angina pectoris: Secondary | ICD-10-CM | POA: Diagnosis not present

## 2023-01-29 DIAGNOSIS — K219 Gastro-esophageal reflux disease without esophagitis: Secondary | ICD-10-CM | POA: Diagnosis not present

## 2023-01-29 DIAGNOSIS — I480 Paroxysmal atrial fibrillation: Secondary | ICD-10-CM | POA: Diagnosis not present

## 2023-01-29 DIAGNOSIS — E785 Hyperlipidemia, unspecified: Secondary | ICD-10-CM | POA: Diagnosis not present

## 2023-01-29 DIAGNOSIS — C4490 Unspecified malignant neoplasm of skin, unspecified: Secondary | ICD-10-CM | POA: Diagnosis not present

## 2023-02-10 LAB — COMPREHENSIVE METABOLIC PANEL

## 2023-02-10 LAB — LIPID PANEL

## 2023-02-10 LAB — TSH+T4F+T3FREE

## 2023-02-11 ENCOUNTER — Telehealth: Payer: Self-pay

## 2023-02-11 ENCOUNTER — Other Ambulatory Visit: Payer: Self-pay

## 2023-02-11 DIAGNOSIS — E782 Mixed hyperlipidemia: Secondary | ICD-10-CM

## 2023-02-11 DIAGNOSIS — Z79899 Other long term (current) drug therapy: Secondary | ICD-10-CM

## 2023-02-11 NOTE — Telephone Encounter (Signed)
Spoke with the [facility] phlebotomist and she called [facility] and was told that they did not have enough blood. Called the patient to have him come back to have the lab work drawn again. Patient did not answer the phone. Left message for the patient to call back.

## 2023-02-11 NOTE — Telephone Encounter (Signed)
Patient returned phone call and was informed that [facility] did not have enough blood to run the test and we would like to re-draw his blood to have the test run again. Patient verbalized understanding and was agreeable with the plan. He stated that he would be in tomorrow morning to have his labs re-drawn. Patient had no further questions at this time.

## 2023-02-12 DIAGNOSIS — E782 Mixed hyperlipidemia: Secondary | ICD-10-CM | POA: Diagnosis not present

## 2023-02-12 DIAGNOSIS — Z79899 Other long term (current) drug therapy: Secondary | ICD-10-CM | POA: Diagnosis not present

## 2023-02-13 LAB — COMPREHENSIVE METABOLIC PANEL
ALT: 28 [IU]/L (ref 0–44)
AST: 23 [IU]/L (ref 0–40)
Albumin: 4 g/dL (ref 3.8–4.8)
Alkaline Phosphatase: 86 [IU]/L (ref 44–121)
BUN/Creatinine Ratio: 12 (ref 10–24)
BUN: 11 mg/dL (ref 8–27)
Bilirubin Total: 0.7 mg/dL (ref 0.0–1.2)
CO2: 25 mmol/L (ref 20–29)
Calcium: 9 mg/dL (ref 8.6–10.2)
Chloride: 104 mmol/L (ref 96–106)
Creatinine, Ser: 0.91 mg/dL (ref 0.76–1.27)
Globulin, Total: 2.1 g/dL (ref 1.5–4.5)
Glucose: 108 mg/dL — ABNORMAL HIGH (ref 70–99)
Potassium: 4.3 mmol/L (ref 3.5–5.2)
Sodium: 138 mmol/L (ref 134–144)
Total Protein: 6.1 g/dL (ref 6.0–8.5)
eGFR: 86 mL/min/{1.73_m2} (ref >59)

## 2023-02-13 LAB — LIPID PANEL
Chol/HDL Ratio: 2.6 {ratio} (ref 0.0–5.0)
Cholesterol, Total: 133 mg/dL (ref 100–199)
HDL: 51 mg/dL (ref >39)
LDL Chol Calc (NIH): 65 mg/dL (ref 0–99)
Triglycerides: 87 mg/dL (ref 0–149)
VLDL Cholesterol Cal: 17 mg/dL (ref 5–40)

## 2023-02-13 LAB — TSH+T4F+T3FREE
Free T4: 1.82 ng/dL — ABNORMAL HIGH (ref 0.82–1.77)
T3, Free: 2.5 pg/mL (ref 2.0–4.4)
TSH: 0.95 u[IU]/mL (ref 0.450–4.500)

## 2023-02-20 DIAGNOSIS — C44319 Basal cell carcinoma of skin of other parts of face: Secondary | ICD-10-CM | POA: Diagnosis not present

## 2023-02-28 ENCOUNTER — Other Ambulatory Visit: Payer: Self-pay | Admitting: Allergy and Immunology

## 2023-03-09 DIAGNOSIS — D6869 Other thrombophilia: Secondary | ICD-10-CM | POA: Diagnosis not present

## 2023-03-09 DIAGNOSIS — I480 Paroxysmal atrial fibrillation: Secondary | ICD-10-CM | POA: Diagnosis not present

## 2023-03-09 DIAGNOSIS — C44321 Squamous cell carcinoma of skin of nose: Secondary | ICD-10-CM | POA: Diagnosis not present

## 2023-03-09 DIAGNOSIS — J449 Chronic obstructive pulmonary disease, unspecified: Secondary | ICD-10-CM | POA: Diagnosis not present

## 2023-03-09 DIAGNOSIS — Z7901 Long term (current) use of anticoagulants: Secondary | ICD-10-CM | POA: Diagnosis not present

## 2023-04-03 DIAGNOSIS — L300 Nummular dermatitis: Secondary | ICD-10-CM | POA: Diagnosis not present

## 2023-04-03 DIAGNOSIS — L820 Inflamed seborrheic keratosis: Secondary | ICD-10-CM | POA: Diagnosis not present

## 2023-04-03 DIAGNOSIS — C44319 Basal cell carcinoma of skin of other parts of face: Secondary | ICD-10-CM | POA: Diagnosis not present

## 2023-04-08 DIAGNOSIS — Z6828 Body mass index (BMI) 28.0-28.9, adult: Secondary | ICD-10-CM | POA: Diagnosis not present

## 2023-04-08 DIAGNOSIS — R030 Elevated blood-pressure reading, without diagnosis of hypertension: Secondary | ICD-10-CM | POA: Diagnosis not present

## 2023-04-08 DIAGNOSIS — M542 Cervicalgia: Secondary | ICD-10-CM | POA: Diagnosis not present

## 2023-04-18 DIAGNOSIS — M791 Myalgia, unspecified site: Secondary | ICD-10-CM | POA: Diagnosis not present

## 2023-04-18 DIAGNOSIS — R0981 Nasal congestion: Secondary | ICD-10-CM | POA: Diagnosis not present

## 2023-04-24 ENCOUNTER — Other Ambulatory Visit: Payer: Self-pay | Admitting: Cardiology

## 2023-04-24 NOTE — Telephone Encounter (Signed)
Prescription refill request for Eliquis received. Indication:afib Last office visit:8/24 Scr:0.91  10/24 Age: 77 Weight:98.1  kg  Prescription refilled

## 2023-05-01 DIAGNOSIS — Z6828 Body mass index (BMI) 28.0-28.9, adult: Secondary | ICD-10-CM | POA: Diagnosis not present

## 2023-05-01 DIAGNOSIS — R030 Elevated blood-pressure reading, without diagnosis of hypertension: Secondary | ICD-10-CM | POA: Diagnosis not present

## 2023-06-18 ENCOUNTER — Ambulatory Visit: Payer: PPO | Admitting: Allergy and Immunology

## 2023-06-29 ENCOUNTER — Ambulatory Visit: Payer: PPO | Admitting: Allergy and Immunology

## 2023-06-29 ENCOUNTER — Encounter: Payer: Self-pay | Admitting: Allergy and Immunology

## 2023-06-29 VITALS — BP 150/88 | HR 56 | Resp 14 | Ht 69.5 in | Wt 217.0 lb

## 2023-06-29 DIAGNOSIS — J3089 Other allergic rhinitis: Secondary | ICD-10-CM | POA: Diagnosis not present

## 2023-06-29 DIAGNOSIS — J4540 Moderate persistent asthma, uncomplicated: Secondary | ICD-10-CM | POA: Diagnosis not present

## 2023-06-29 DIAGNOSIS — K219 Gastro-esophageal reflux disease without esophagitis: Secondary | ICD-10-CM

## 2023-06-29 NOTE — Progress Notes (Unsigned)
 [place] - [place] - [place] - [place] - [place]   Follow-up Note  Referring Provider: Street, Steve Frye, * Primary Provider: Street, Steve Coup, MD Date of Office Visit: 06/29/2023  Subjective:   Steve Frye (DOB: 20-Apr-1946) is a 78 y.o. male who [facility]23 in re-evaluation of the following:  HPI: Steve Frye returns to this clinic in evaluation of asthma, rhinitis, LPR.  I last saw him in this clinic 17 December 2022.  He has really done well with his asthma and he rarely uses a short acting bronchodilator and he exercises just about every day while he continues on [place] mostly just 1 time per day.  He has not required a systemic steroid or an antibiotic for any type of airway issue.  He did contract COVID in December 2020 for which was treated with Paxlovid with a good response.  His nose is doing very well while using some Nasacort.  Although his reflux is under good control he does have some phlegm in his throat with some throat clearing.  He is only using his famotidine 1 time per day.  Allergies as of 06/29/2023       Reactions   Penicillins Hives   In his 20's        Medication List    ALIGN PO Take 1 tablet by mouth daily.   amiodarone 200 MG tablet Commonly known as: PACERONE TAKE 1 TABLET BY MOUTH EVERY DAY   atorvastatin 40 MG tablet Commonly known as: LIPITOR Take 40 mg by mouth at bedtime.   Breztri Aerosphere 160-9-4.8 MCG/ACT Aero Generic drug: Budeson-Glycopyrrol-Formoterol INHALE TWO PUFFS TWICE DAILY TO PREVENT COUGH OR WHEEZE. RINSE, GARGLE, AND SPIT AFTER USE.   Eliquis 5 MG Tabs tablet Generic drug: apixaban TAKE 1 TABLET BY MOUTH TWICE A DAY   famotidine 40 MG tablet Commonly known as: PEPCID TAKE 1 TABLET BY MOUTH EVERY DAY   finasteride 5 MG tablet Commonly known as: PROSCAR Take 5 mg by mouth daily.   metoprolol tartrate 25 MG tablet Commonly known as: LOPRESSOR Take 1 tablet  (25 mg total) by mouth 2 (two) times daily.   PRESERVISION AREDS 2 PO Take by mouth daily.   triamcinolone 55 MCG/ACT Aero nasal inhaler Commonly known as: NASACORT Place 2 sprays into the nose at bedtime.   Ventolin HFA 108 (90 Base) MCG/ACT inhaler Generic drug: albuterol Inhale 2 puffs into the lungs as needed for wheezing or shortness of breath.   vitamin C 1000 MG tablet Take 1,000 mg by mouth daily.   VITAMIN D3 PO Take 1 tablet by mouth daily.    Past Medical History:  Diagnosis Date   Acute coronary syndrome (HCC) 09/28/2019   Arthritis 01/15/2017   Atrial fibrillation (HCC)    Cancer (HCC)    skin cancer   Chest mass 12/04/2015   Chronic hyponatremia 02/04/2022   Coronary artery disease    Coronary artery disease of native artery of native heart with stable angina pectoris (HCC)    COVID-19 virus infection 01/30/2020   Cutaneous abscess of chest wall 12/04/2015   Dyslipidemia 02/04/2022   Dyspnea    Dysrhythmia    afib   Encounter for postoperative wound check 02/01/2020   Epidermoid cyst of skin 12/04/2015   GERD (gastroesophageal reflux disease) 01/15/2017   High risk medication use 01/10/2015   Overview:  Overview:  flecanide   Hyperlipidemia 01/15/2017   Ileus (HCC) 02/04/2022   LAE (left atrial  enlargement) 01/15/2017   Open wound of chest wall 04/01/2016   PAF (paroxysmal atrial fibrillation) (HCC) 01/10/2015   Overview:  CHADS2 vasc score=1   Pneumonia due to COVID-19 virus 02/04/2022   Postop check 11/03/2019   S/P CABG x 3 10/04/2019   Troponin I above reference range 02/04/2022    Past Surgical History:  Procedure Laterality Date   APPENDECTOMY     CARDIAC CATHETERIZATION     09/23/19   CHOLECYSTECTOMY     CORONARY ARTERY BYPASS GRAFT N/A 10/04/2019   Procedure: CORONARY ARTERY BYPASS GRAFTING (CABG) times three using right greater saphenous vein and left internal mammary ;  Surgeon: Steve Perna, MD;  Location: [facility] OR;  Service: Open Heart Surgery;   Laterality: N/A;   CYST EXCISION     ENDOVEIN HARVEST OF GREATER SAPHENOUS VEIN Right 10/04/2019   Procedure: Steve Frye Of Greater Saphenous Vein;  Surgeon: Steve Perna, MD;  Location: [facility] OR;  Service: Open Heart Surgery;  Laterality: Right;   EXCISION OF KELOID N/A 10/04/2019   Procedure: Excision Of Sternal Keloid;  Surgeon: Steve Perna, MD;  Location: [facility] OR;  Service: Open Heart Surgery;  Laterality: N/A;   LAPAROSCOPIC LYSIS INTESTINAL ADHESIONS  2012   LEFT HEART CATH AND CORONARY ANGIOGRAPHY N/A 09/23/2019   Procedure: LEFT HEART CATH AND CORONARY ANGIOGRAPHY;  Surgeon: Steve Records, MD;  Location: [facility];  Service: Cardiovascular;  Laterality: N/A;   TEE WITHOUT CARDIOVERSION N/A 10/04/2019   Procedure: TRANSESOPHAGEAL ECHOCARDIOGRAM (TEE);  Surgeon: Steve Frye, Steve Arista, MD;  Location: [facility] OR;  Service: Open Heart Surgery;  Laterality: N/A;    Review of systems negative except as noted in HPI / PMHx or noted below:  Review of Systems  Constitutional: Negative.   HENT: Negative.    Eyes: Negative.   Respiratory: Negative.    Cardiovascular: Negative.   Gastrointestinal: Negative.   Genitourinary: Negative.   Musculoskeletal: Negative.   Skin: Negative.   Neurological: Negative.   Endo/Heme/Allergies: Negative.   Psychiatric/Behavioral: Negative.       Objective:   Vitals:   06/29/23 1100 06/29/23 1135  BP: (!) 166/92 (!) 150/88  Pulse: (!) 56   Resp: 14   SpO2: 98%    Height: 5' 9.5" (176.5 cm)  Weight: 217 lb (98.4 kg)   Physical Exam Constitutional:      Appearance: He is not diaphoretic.  HENT:     Head: Normocephalic.     Right Ear: Tympanic membrane, ear canal and external ear normal.     Left Ear: Tympanic membrane, ear canal and external ear normal.     Nose: Nose normal. No mucosal edema or rhinorrhea.     Mouth/Throat:     Pharynx: Uvula midline. No oropharyngeal exudate.  Eyes:     Conjunctiva/sclera: Conjunctivae normal.   Neck:     Thyroid: No thyromegaly.     Trachea: Trachea normal. No tracheal tenderness or tracheal deviation.  Cardiovascular:     Rate and Rhythm: Normal rate and regular rhythm.     Heart sounds: Normal heart sounds, S1 normal and S2 normal. No murmur heard. Pulmonary:     Effort: No respiratory distress.     Breath sounds: Normal breath sounds. No stridor. No wheezing or rales.  Lymphadenopathy:     Head:     Right side of head: No tonsillar adenopathy.     Left side of head: No tonsillar adenopathy.     Cervical: No cervical adenopathy.  Skin:    Findings: No erythema or rash.     Nails: There is no clubbing.  Neurological:     Mental Status: He is alert.     Diagnostics: Spirometry was performed and demonstrated an FEV1 of 2.47 at 86 % of predicted.  Assessment and Plan:   1. Asthma, moderate persistent, well-controlled   2. Perennial allergic rhinitis   3. LPRD (laryngopharyngeal reflux disease)    1.  Continue to treat and prevent inflammation:   A.  Nasacort - 1 spray each nostril 1-2 time per day  B.  Breztri - 2 inhalations 1-2 times per day  2. Continue to Treat and prevent reflux:   A. Famotidine 40mg  tablet 1-2 times per day  3. If needed:   A. Nasal saline  B. OTC antihistamine  C. Albuterol HFA - 2 inhalations every 4-6 hours  4. Influenza = Tamiflu. Covid = Paxlovid  5. Return to clinic in 6 months or earlier if problem  Catalino appears to be doing quite well with his airway and he will remain on anti-inflammatory agents for both his upper and lower airway.  There does appear to be an issue with some phlegm in his throat and I recommend that he try a month of famotidine twice a day instead of once a day and if that takes care of that issue then it is LPR for why he has all that phlegm in his throat.  If he does well I will see him back in this clinic in 6 months or earlier if there is a problem with this plan.  Laurette Schimke, MD Allergy /  Immunology [facility]

## 2023-06-29 NOTE — Patient Instructions (Signed)
  1.  Continue to treat and prevent inflammation:   A.  Nasacort - 1 spray each nostril 1-2 time per day  B.  Breztri - 2 inhalations 1-2 times per day  2. Continue to Treat and prevent reflux:   A. Famotidine 40mg  tablet 1-2 times per day  3. If needed:   A. Nasal saline  B. OTC antihistamine  C. Albuterol HFA - 2 inhalations every 4-6 hours  4. Influenza = Tamiflu. Covid = Paxlovid  5. Return to clinic in 6 months or earlier if problem

## 2023-06-30 ENCOUNTER — Other Ambulatory Visit: Payer: Self-pay | Admitting: Allergy and Immunology

## 2023-06-30 ENCOUNTER — Encounter: Payer: Self-pay | Admitting: Allergy and Immunology

## 2023-07-03 DIAGNOSIS — L820 Inflamed seborrheic keratosis: Secondary | ICD-10-CM | POA: Diagnosis not present

## 2023-07-03 DIAGNOSIS — L910 Hypertrophic scar: Secondary | ICD-10-CM | POA: Diagnosis not present

## 2023-07-03 DIAGNOSIS — L570 Actinic keratosis: Secondary | ICD-10-CM | POA: Diagnosis not present

## 2023-07-31 DIAGNOSIS — S51811A Laceration without foreign body of right forearm, initial encounter: Secondary | ICD-10-CM | POA: Diagnosis not present

## 2023-07-31 DIAGNOSIS — Z0000 Encounter for general adult medical examination without abnormal findings: Secondary | ICD-10-CM | POA: Diagnosis not present

## 2023-07-31 DIAGNOSIS — N138 Other obstructive and reflux uropathy: Secondary | ICD-10-CM | POA: Diagnosis not present

## 2023-07-31 DIAGNOSIS — N401 Enlarged prostate with lower urinary tract symptoms: Secondary | ICD-10-CM | POA: Diagnosis not present

## 2023-07-31 DIAGNOSIS — I4891 Unspecified atrial fibrillation: Secondary | ICD-10-CM | POA: Diagnosis not present

## 2023-07-31 DIAGNOSIS — Z79899 Other long term (current) drug therapy: Secondary | ICD-10-CM | POA: Diagnosis not present

## 2023-07-31 DIAGNOSIS — I25119 Atherosclerotic heart disease of native coronary artery with unspecified angina pectoris: Secondary | ICD-10-CM | POA: Diagnosis not present

## 2023-07-31 DIAGNOSIS — D6869 Other thrombophilia: Secondary | ICD-10-CM | POA: Diagnosis not present

## 2023-07-31 DIAGNOSIS — Z951 Presence of aortocoronary bypass graft: Secondary | ICD-10-CM | POA: Diagnosis not present

## 2023-07-31 DIAGNOSIS — E782 Mixed hyperlipidemia: Secondary | ICD-10-CM | POA: Diagnosis not present

## 2023-07-31 DIAGNOSIS — J301 Allergic rhinitis due to pollen: Secondary | ICD-10-CM | POA: Diagnosis not present

## 2023-07-31 DIAGNOSIS — Z131 Encounter for screening for diabetes mellitus: Secondary | ICD-10-CM | POA: Diagnosis not present

## 2023-08-03 ENCOUNTER — Other Ambulatory Visit: Payer: Self-pay | Admitting: Cardiology

## 2023-08-04 MED ORDER — METOPROLOL TARTRATE 25 MG PO TABS: 25.0000 mg | ORAL_TABLET | Freq: Two times a day (BID) | ORAL | 0 refills | Status: DC

## 2023-09-11 ENCOUNTER — Ambulatory Visit: Admitting: Cardiology

## 2023-09-11 ENCOUNTER — Other Ambulatory Visit: Payer: Self-pay

## 2023-09-11 ENCOUNTER — Ambulatory Visit

## 2023-09-11 VITALS — BP 142/76 | HR 67 | Ht 72.0 in | Wt 215.6 lb

## 2023-09-11 DIAGNOSIS — E782 Mixed hyperlipidemia: Secondary | ICD-10-CM

## 2023-09-11 DIAGNOSIS — I480 Paroxysmal atrial fibrillation: Secondary | ICD-10-CM | POA: Diagnosis not present

## 2023-09-11 DIAGNOSIS — I25118 Atherosclerotic heart disease of native coronary artery with other forms of angina pectoris: Secondary | ICD-10-CM | POA: Diagnosis not present

## 2023-09-11 DIAGNOSIS — I10 Essential (primary) hypertension: Secondary | ICD-10-CM

## 2023-09-11 HISTORY — DX: Essential (primary) hypertension: I10

## 2023-09-11 MED ORDER — LOSARTAN POTASSIUM 25 MG PO TABS: 25.0000 mg | ORAL_TABLET | Freq: Every day | ORAL | 3 refills | Status: DC

## 2023-09-11 NOTE — Assessment & Plan Note (Signed)
 Last lipid panel from 07/31/2023 total cholesterol 127, HDL 48, LDL 65, triglycerides 91.  Optimal. Continue atorvastatin 40 mg once daily.

## 2023-09-11 NOTE — Patient Instructions (Signed)
 Medication Instructions:  Your physician has recommended you make the following change in your medication:   START: Losartan 25 mg daily  *If you need a refill on your cardiac medications before your next appointment, please call your pharmacy*  Lab Work: Your physician recommends that you return for lab work in:   Labs in 4 weeks: BMP  If you have labs (blood work) drawn today and your tests are completely normal, you will receive your results only by: MyChart Message (if you have MyChart) OR A paper copy in the mail If you have any lab test that is abnormal or we need to change your treatment, we will call you to review the results.  Testing/Procedures: None  Follow-Up: At [facility], you and your health needs are our priority.  As part of our continuing mission to provide you with exceptional heart care, our providers are all part of one team.  This team includes your primary Cardiologist (physician) and Advanced Practice Providers or APPs (Physician Assistants and Nurse Practitioners) who all work together to provide you with the care you need, when you need it.  Your next appointment:   6 month(s)  Provider:   Bertha Broad, MD    We recommend signing up for the patient portal called "MyChart".  Sign up information is provided on this After Visit Summary.  MyChart is used to connect with patients for Virtual Visits (Telemedicine).  Patients are able to view lab/test results, encounter notes, upcoming appointments, etc.  Non-urgent messages can be sent to your provider as well.   To learn more about what you can do with MyChart, go to [email or web address].   Other Instructions None

## 2023-09-11 NOTE — Progress Notes (Signed)
 Cardiology Consultation:    Date:  09/11/2023   ID:  Steve Frye, DOB [birth year], MRN [id number]  PCP:  Street, Renford Cartwright, MD  Cardiologist:  Daymon Evans Breslin Hemann, MD   Referring MD: Street, Renford Cartwright, *   No chief complaint on file.    ASSESSMENT AND PLAN:   Steve Frye 78 year old male  history of coronary artery disease with abnormal cardiac CT imaging May 2021 followed by cardiac cath and subsequently s/p CABG June 2021 with last stress test from April 2022 showing no ischemia on Lexiscan  nuclear imaging, and last echocardiogram available to review from June 2021 LVEF grossly normal with limited imaging due to recent CABG at the time, paroxysmal atrial fibrillation on rhythm control with amiodarone , hyperlipidemia, COPD, prediabetes   Problem List Items Addressed This Visit     Hyperlipidemia - Primary   Last lipid panel from 07/31/2023 total cholesterol 127, HDL 48, LDL 65, triglycerides 91.  Optimal. Continue atorvastatin 40 mg once daily.       Relevant Medications   losartan (COZAAR) 25 MG tablet   Other Relevant Orders   EKG 12-Lead (Completed)   Basic Metabolic Panel (BMET)   Coronary artery disease of native artery of native heart with stable angina pectoris (HCC)   Abnormal cardiac CT imaging followed by cardiac cath and s/p CABG June 2021. Normal biventricular function pre and post surgery on echocardiogram in 2021.  Last ischemic workup with Lexiscan  stress test April 2022 no ischemia.  Good functional status. Remains asymptomatic. Not on aspirin  as he remains on Eliquis  for A-fib. Continue atorvastatin 40 mg once daily. Remains on low-dose metoprolol  tartrate 25 mg twice daily.       Relevant Medications   losartan (COZAAR) 25 MG tablet   Atrial fibrillation (HCC)   Paroxysmal atrial fibrillation. Remains in sinus rhythm today by EKG. Relatively asymptomatic and reports no breakthrough episodes. Remains on rhythm control with amiodarone  200 mg  once daily. Last TSH 0.668 normal 07/31/2023. Follows up consistently with pulmonologist for COPD management. Transaminases from 07/31/2023 normal.  CHA2DS2-VASc score 4. Remains on anticoagulation with Eliquis  5 mg twice daily. Continue the same.        Relevant Medications   losartan (COZAAR) 25 MG tablet   Hypertension   Suboptimal blood pressure control. Consistently elevated systolic blood pressure above 409W at home per patient.  Continue metoprolol  titrate 25 mg twice daily. Will add losartan 25 mg once daily. Discussed mechanism of action and potential side effects such as electrolyte abnormalities, renal dysfunction, allergies.  Advised to keep a log of blood pressures for the next couple weeks at home consistently. Blood work in 3 to 4 weeks to follow-up on basic metabolic panel to monitor electrolytes and kidney function. Last creatinine 08/18/2023 was 1.3.       Relevant Medications   losartan (COZAAR) 25 MG tablet  Return to clinic tentatively in 6 months.    History of Present Illness:    Steve Frye is a 78 y.o. male who is being seen today for follow-up visit. PCP is Street, [place], *. Last visit at our office was 12/11/2022 with Dr. Richardo Chandler man here for the visit by himself.  Lives with his wife at home.  Keeps himself busy with day-to-day activities and walks regularly for exercise.  Has history of coronary artery disease with abnormal cardiac CT imaging May 2021 followed by cardiac cath and subsequently s/p CABG June 2021 with last stress test from April 2022 showing no  ischemia on Lexiscan  nuclear imaging, and last echocardiogram available to review from June 2021 LVEF grossly normal with limited imaging due to recent CABG at the time, paroxysmal atrial fibrillation on rhythm control with amiodarone , hyperlipidemia, COPD, prediabetes [hemoglobin A1c 6.1 on 07/31/2023]  Here today for follow-up visit denies any active symptoms.  Keeps himself  busy with day-to-day activities. Mentions occasional bruising when he bumps into things or scratches the skin but no major bleeding concerns. No other significant cardiac symptoms. He does report mild COPD related symptoms which have been steady over the years.  Good compliance with medications. Mentions blood pressures at home typically running around systolic 130s to 960A.  EKG in the clinic today shows sinus rhythm heart rate 67/min, PR interval 198 ms, QRS duration 108 ms, QTc 471 ms.   Past Medical History:  Diagnosis Date   Acute coronary syndrome (HCC) 09/28/2019   Arthritis 01/15/2017   Atrial fibrillation (HCC)    Cancer (HCC)    skin cancer   Chest mass 12/04/2015   Chronic hyponatremia 02/04/2022   Coronary artery disease    Coronary artery disease of native artery of native heart with stable angina pectoris (HCC)    COVID-19 virus infection 01/30/2020   Cutaneous abscess of chest wall 12/04/2015   Dyslipidemia 02/04/2022   Dyspnea    Dysrhythmia    afib   Encounter for postoperative wound check 02/01/2020   Epidermoid cyst of skin 12/04/2015   GERD (gastroesophageal reflux disease) 01/15/2017   High risk medication use 01/10/2015   Overview:  Overview:  flecanide   Hyperlipidemia 01/15/2017   Ileus (HCC) 02/04/2022   LAE (left atrial enlargement) 01/15/2017   Open wound of chest wall 04/01/2016   PAF (paroxysmal atrial fibrillation) (HCC) 01/10/2015   Overview:  CHADS2 vasc score=1   Pneumonia due to COVID-19 virus 02/04/2022   Postop check 11/03/2019   S/P CABG x 3 10/04/2019   Troponin I above reference range 02/04/2022    Past Surgical History:  Procedure Laterality Date   APPENDECTOMY     CARDIAC CATHETERIZATION     09/23/19   CHOLECYSTECTOMY     CORONARY ARTERY BYPASS GRAFT N/A 10/04/2019   Procedure: CORONARY ARTERY BYPASS GRAFTING (CABG) times three using right greater saphenous vein and left internal mammary ;  Surgeon: Heriberto London, MD;  Location: [facility] OR;   Service: Open Heart Surgery;  Laterality: N/A;   CYST EXCISION     ENDOVEIN HARVEST OF GREATER SAPHENOUS VEIN Right 10/04/2019   Procedure: Astrid Blamer Of Greater Saphenous Vein;  Surgeon: Heriberto London, MD;  Location: [facility] OR;  Service: Open Heart Surgery;  Laterality: Right;   EXCISION OF KELOID N/A 10/04/2019   Procedure: Excision Of Sternal Keloid;  Surgeon: Heriberto London, MD;  Location: [facility] OR;  Service: Open Heart Surgery;  Laterality: N/A;   LAPAROSCOPIC LYSIS INTESTINAL ADHESIONS  2012   LEFT HEART CATH AND CORONARY ANGIOGRAPHY N/A 09/23/2019   Procedure: LEFT HEART CATH AND CORONARY ANGIOGRAPHY;  Surgeon: Arty Binning, MD;  Location: [facility] IN[facility];  Service: Cardiovascular;  Laterality: N/A;   TEE WITHOUT CARDIOVERSION N/A 10/04/2019   Procedure: TRANSESOPHAGEAL ECHOCARDIOGRAM (TEE);  Surgeon: Matt Song, Donata Fryer, MD;  Location: [facility] OR;  Service: Open Heart Surgery;  Laterality: N/A;    Current Medications: Current Meds  Medication Sig   amiodarone  (PACERONE ) 200 MG tablet TAKE 1 TABLET BY MOUTH EVERY DAY   Ascorbic Acid (VITAMIN C) 1000 MG tablet Take 1,000 mg by  mouth daily.   atorvastatin (LIPITOR) 40 MG tablet Take 40 mg by mouth at bedtime.   BREZTRI  AEROSPHERE 160-9-4.8 MCG/ACT AERO INHALE TWO PUFFS TWICE DAILY TO PREVENT COUGH OR WHEEZE. RINSE, GARGLE, AND SPIT AFTER USE.   Cholecalciferol (VITAMIN D3 PO) Take 1 tablet by mouth daily.   clobetasol ointment (TEMOVATE) 0.05 % Apply 1 Application topically daily.   ELIQUIS  5 MG TABS tablet TAKE 1 TABLET BY MOUTH TWICE A DAY   famotidine  (PEPCID ) 40 MG tablet TAKE ONE TABLET ONE TO TWO TIMES DAILY AS DIRECTED.   finasteride (PROSCAR) 5 MG tablet Take 5 mg by mouth daily.   losartan (COZAAR) 25 MG tablet Take 1 tablet (25 mg total) by mouth daily.   metoprolol  tartrate (LOPRESSOR ) 25 MG tablet Take 1 tablet (25 mg total) by mouth 2 (two) times daily.   Multiple Vitamins-Minerals (PRESERVISION AREDS 2 PO) Take 1 tablet by  mouth daily.   Probiotic Product (ALIGN PO) Take 1 tablet by mouth daily.   triamcinolone  (NASACORT ) 55 MCG/ACT AERO nasal inhaler Place 2 sprays into the nose at bedtime.   VENTOLIN HFA 108 (90 Base) MCG/ACT inhaler Inhale 2 puffs into the lungs as needed for wheezing or shortness of breath.     Allergies:   Penicillins   Social History   Socioeconomic History   Marital status: Married    Spouse name: Not on file   Number of children: Not on file   Years of education: Not on file   Highest education level: Not on file  Occupational History   Not on file  Tobacco Use   Smoking status: Former    Current packs/day: 0.00    Types: Cigarettes    Start date: 01/10/1959    Quit date: 01/10/1979    Years since quitting: 44.6   Smokeless tobacco: Never  Vaping Use   Vaping status: Never Used  Substance and Sexual Activity   Alcohol use: No   Drug use: No   Sexual activity: Not on file  Other Topics Concern   Not on file  Social History Narrative   Not on file   Social Drivers of Health   Financial Resource Strain: Not on file  Food Insecurity: Not on file  Transportation Needs: Not on file  Physical Activity: Not on file  Stress: Not on file  Social Connections: Not on file     Family History: The patient's family history includes Heart disease in his father; Heart failure in his father. ROS:   Please see the history of present illness.    All 14 point review of systems negative except as described per history of present illness.  EKGs/Labs/Other Studies Reviewed:    The following studies were reviewed today:   EKG:  EKG Interpretation Date/Time:  Friday Sep 11 2023 08:01:42 EDT Ventricular Rate:  67 PR Interval:  198 QRS Duration:  108 QT Interval:  446 QTC Calculation: 471 R Axis:   -8  Text Interpretation: Sinus rhythm with Premature atrial complexes Possible Inferior infarct , age undetermined Abnormal ECG When compared with ECG of 11-Dec-2022 09:29,  Premature atrial complexes are now Present Borderline criteria for Inferior infarct are now Present Confirmed by Bertha Broad reddy [phone]) on 09/11/2023 8:12:01 AM    Recent Labs: 02/12/2023: ALT 28; BUN 11; Creatinine, Ser 0.91; Potassium 4.3; Sodium 138; TSH 0.950  Recent Lipid Panel    Component Value Date/Time   CHOL 133 02/12/2023 0922   TRIG 87 02/12/2023 0922   HDL  51 02/12/2023 0922   CHOLHDL 2.6 02/12/2023 0922   LDLCALC 65 02/12/2023 0922    Physical Exam:    VS:  BP (!) 142/76   Pulse 67   Ht 6' (1.829 m)   Wt 215 lb 9.6 oz (97.8 kg)   SpO2 95%   BMI 29.24 kg/m     Wt Readings from Last 3 Encounters:  09/11/23 215 lb 9.6 oz (97.8 kg)  06/29/23 217 lb (98.4 kg)  12/11/22 216 lb 3.2 oz (98.1 kg)     GENERAL:  Well nourished, well developed in no acute distress NECK: No JVD; No carotid bruits CARDIAC: RRR, S1 and S2 present, no murmurs, no rubs, no gallops CHEST:  Clear to auscultation without rales, wheezing or rhonchi  Extremities: No pitting pedal edema. Pulses bilaterally symmetric with radial 2+ and dorsalis pedis 2+ NEUROLOGIC:  Alert and oriented x 3  Medication Adjustments/Labs and Tests Ordered: Current medicines are reviewed at length with the patient today.  Concerns regarding medicines are outlined above.  Orders Placed This Encounter  Procedures   Basic Metabolic Panel (BMET)   EKG 12-Lead   Meds ordered this encounter  Medications   losartan (COZAAR) 25 MG tablet    Sig: Take 1 tablet (25 mg total) by mouth daily.    Dispense:  90 tablet    Refill:  3    Signed, Reyhan Moronta reddy Charmika Macdonnell, MD, MPH, [facility]. 09/11/2023 8:32 AM    [facility]

## 2023-09-11 NOTE — Assessment & Plan Note (Signed)
 Abnormal cardiac CT imaging followed by cardiac cath and s/p CABG June 2021. Normal biventricular function pre and post surgery on echocardiogram in 2021.  Last ischemic workup with Lexiscan  stress test April 2022 no ischemia.  Good functional status. Remains asymptomatic. Not on aspirin  as he remains on Eliquis  for A-fib. Continue atorvastatin 40 mg once daily. Remains on low-dose metoprolol  tartrate 25 mg twice daily.

## 2023-09-11 NOTE — Assessment & Plan Note (Addendum)
 Suboptimal blood pressure control. Consistently elevated systolic blood pressure above 409W at home per patient.  Continue metoprolol  titrate 25 mg twice daily. Will add losartan 25 mg once daily. Discussed mechanism of action and potential side effects such as electrolyte abnormalities, renal dysfunction, allergies.  Advised to keep a log of blood pressures for the next couple weeks at home consistently. Blood work in 3 to 4 weeks to follow-up on basic metabolic panel to monitor electrolytes and kidney function. Last creatinine 08/18/2023 was 1.3.

## 2023-09-11 NOTE — Assessment & Plan Note (Signed)
 Paroxysmal atrial fibrillation. Remains in sinus rhythm today by EKG. Relatively asymptomatic and reports no breakthrough episodes. Remains on rhythm control with amiodarone  200 mg once daily. Last TSH 0.668 normal 07/31/2023. Follows up consistently with pulmonologist for COPD management. Transaminases from 07/31/2023 normal.  CHA2DS2-VASc score 4. Remains on anticoagulation with Eliquis  5 mg twice daily. Continue the same.

## 2023-10-08 DIAGNOSIS — L300 Nummular dermatitis: Secondary | ICD-10-CM | POA: Diagnosis not present

## 2023-10-08 DIAGNOSIS — D485 Neoplasm of uncertain behavior of skin: Secondary | ICD-10-CM | POA: Diagnosis not present

## 2023-10-12 DIAGNOSIS — D3132 Benign neoplasm of left choroid: Secondary | ICD-10-CM | POA: Diagnosis not present

## 2023-10-12 DIAGNOSIS — H40012 Open angle with borderline findings, low risk, left eye: Secondary | ICD-10-CM | POA: Diagnosis not present

## 2023-10-13 ENCOUNTER — Other Ambulatory Visit: Payer: Self-pay | Admitting: Cardiology

## 2023-10-13 DIAGNOSIS — E782 Mixed hyperlipidemia: Secondary | ICD-10-CM | POA: Diagnosis not present

## 2023-10-14 LAB — BASIC METABOLIC PANEL WITH GFR
BUN/Creatinine Ratio: 13 (ref 10–24)
BUN: 12 mg/dL (ref 8–27)
CO2: 21 mmol/L (ref 20–29)
Calcium: 9.1 mg/dL (ref 8.6–10.2)
Chloride: 102 mmol/L (ref 96–106)
Creatinine, Ser: 0.89 mg/dL (ref 0.76–1.27)
Glucose: 104 mg/dL — ABNORMAL HIGH (ref 70–99)
Potassium: 4.4 mmol/L (ref 3.5–5.2)
Sodium: 139 mmol/L (ref 134–144)
eGFR: 88 mL/min/{1.73_m2} (ref >59)

## 2023-10-14 NOTE — Telephone Encounter (Signed)
 Pt last saw Dr Madireddy 09/11/23, last labs 10/13/23 Creat 0.89, age 78, weight 97.8kg, based on specified criteria pt is on appropriate dosage of Eliquis  5mg  BID for afib.  Will refill rx.

## 2023-10-15 DIAGNOSIS — C44622 Squamous cell carcinoma of skin of right upper limb, including shoulder: Secondary | ICD-10-CM | POA: Diagnosis not present

## 2023-10-28 DIAGNOSIS — M79671 Pain in right foot: Secondary | ICD-10-CM | POA: Diagnosis not present

## 2023-10-28 DIAGNOSIS — Z6828 Body mass index (BMI) 28.0-28.9, adult: Secondary | ICD-10-CM | POA: Diagnosis not present

## 2023-11-08 DIAGNOSIS — H66003 Acute suppurative otitis media without spontaneous rupture of ear drum, bilateral: Secondary | ICD-10-CM | POA: Diagnosis not present

## 2023-11-19 DIAGNOSIS — Z6828 Body mass index (BMI) 28.0-28.9, adult: Secondary | ICD-10-CM | POA: Diagnosis not present

## 2023-11-19 DIAGNOSIS — D6489 Other specified anemias: Secondary | ICD-10-CM | POA: Diagnosis not present

## 2023-11-19 DIAGNOSIS — D6869 Other thrombophilia: Secondary | ICD-10-CM | POA: Diagnosis not present

## 2023-11-19 DIAGNOSIS — I4891 Unspecified atrial fibrillation: Secondary | ICD-10-CM | POA: Diagnosis not present

## 2023-11-23 ENCOUNTER — Other Ambulatory Visit: Payer: Self-pay | Admitting: Cardiology

## 2023-11-26 DIAGNOSIS — L820 Inflamed seborrheic keratosis: Secondary | ICD-10-CM | POA: Diagnosis not present

## 2023-11-26 DIAGNOSIS — C44622 Squamous cell carcinoma of skin of right upper limb, including shoulder: Secondary | ICD-10-CM | POA: Diagnosis not present

## 2023-11-26 DIAGNOSIS — D485 Neoplasm of uncertain behavior of skin: Secondary | ICD-10-CM | POA: Diagnosis not present

## 2023-12-04 MED ORDER — LOSARTAN POTASSIUM 50 MG PO TABS
50.0000 mg | ORAL_TABLET | Freq: Every day | ORAL | 3 refills | Status: AC
Start: 2023-12-04 — End: ?

## 2023-12-22 DIAGNOSIS — C44622 Squamous cell carcinoma of skin of right upper limb, including shoulder: Secondary | ICD-10-CM | POA: Diagnosis not present

## 2023-12-23 DIAGNOSIS — M5442 Lumbago with sciatica, left side: Secondary | ICD-10-CM | POA: Diagnosis not present

## 2023-12-29 DIAGNOSIS — Z6829 Body mass index (BMI) 29.0-29.9, adult: Secondary | ICD-10-CM | POA: Diagnosis not present

## 2023-12-29 DIAGNOSIS — M5442 Lumbago with sciatica, left side: Secondary | ICD-10-CM | POA: Diagnosis not present

## 2023-12-30 ENCOUNTER — Ambulatory Visit: Admitting: Allergy and Immunology

## 2024-01-19 DIAGNOSIS — M25652 Stiffness of left hip, not elsewhere classified: Secondary | ICD-10-CM | POA: Diagnosis not present

## 2024-01-19 DIAGNOSIS — M25651 Stiffness of right hip, not elsewhere classified: Secondary | ICD-10-CM | POA: Diagnosis not present

## 2024-01-19 DIAGNOSIS — M79605 Pain in left leg: Secondary | ICD-10-CM | POA: Diagnosis not present

## 2024-01-19 DIAGNOSIS — R293 Abnormal posture: Secondary | ICD-10-CM | POA: Diagnosis not present

## 2024-01-19 DIAGNOSIS — M5442 Lumbago with sciatica, left side: Secondary | ICD-10-CM | POA: Diagnosis not present

## 2024-01-19 DIAGNOSIS — M2560 Stiffness of unspecified joint, not elsewhere classified: Secondary | ICD-10-CM | POA: Diagnosis not present

## 2024-01-19 DIAGNOSIS — M6281 Muscle weakness (generalized): Secondary | ICD-10-CM | POA: Diagnosis not present

## 2024-01-25 ENCOUNTER — Ambulatory Visit: Admitting: Allergy and Immunology

## 2024-01-25 ENCOUNTER — Encounter: Payer: Self-pay | Admitting: Allergy and Immunology

## 2024-01-25 VITALS — BP 128/64 | HR 57 | Resp 18

## 2024-01-25 DIAGNOSIS — J4540 Moderate persistent asthma, uncomplicated: Secondary | ICD-10-CM | POA: Diagnosis not present

## 2024-01-25 DIAGNOSIS — K219 Gastro-esophageal reflux disease without esophagitis: Secondary | ICD-10-CM | POA: Diagnosis not present

## 2024-01-25 DIAGNOSIS — J3089 Other allergic rhinitis: Secondary | ICD-10-CM | POA: Diagnosis not present

## 2024-01-25 NOTE — Patient Instructions (Signed)
  1.  Continue to treat and prevent inflammation:   A.  Nasacort - 1 spray each nostril 1-2 time per day  B.  Breztri - 2 inhalations 1-2 times per day  2. Continue to Treat and prevent reflux:   A. Famotidine 40mg  tablet 1-2 times per day  3. If needed:   A. Nasal saline  B. OTC antihistamine  C. Albuterol HFA - 2 inhalations every 4-6 hours  4. Influenza = Tamiflu. Covid = Paxlovid  5. Return to clinic in 6 months or earlier if problem

## 2024-01-25 NOTE — Progress Notes (Unsigned)
 [place] [facility] [place] [facility] [place] [facility] [place] [facility] [place]   Follow-up Note  Referring Provider: Street, Lonni HERO, * Primary Provider: Street, Lonni HERO, MD Date of Office Visit: 01/25/2024  Subjective:   Steve Frye (DOB: [birth year]) is a 78 y.o. male who [facility] in re-evaluation of the following:  HPI: Steve Frye returns to this clinic in reevaluation of asthma, rhinitis, LPR.  I last saw him in this clinic 29 June 2023.  He has really done well with his asthma and he has not required a systemic steroid to treat an exacerbation and he rarely uses a short acting bronchodilator and he can exert himself without any problem while he continues on Breztri  mostly just 1 time per day.  And his nose has really been doing quite well while using Nasacort  on a daily basis.  He has not required an antibiotic treat an episode of sinusitis.  Reflux in his throat issue appears to be under very good control while using famotidine  mostly just 1 time per day.  He did have a change in his health status recently.  He has high blood pressure and is now on losartan .  He apparently ruptured a disc in his lower back and had sciatica requiring him to go to the emergency room and received systemic steroids with resolution of that issue.  Allergies as of 01/25/2024       Reactions   Penicillins Hives   In his 20's        Medication List    ALIGN PO Take 1 tablet by mouth daily.   amiodarone  200 MG tablet Commonly known as: PACERONE  TAKE 1 TABLET BY MOUTH EVERY DAY   atorvastatin 40 MG tablet Commonly known as: LIPITOR Take 40 mg by mouth at bedtime.   Breztri  Aerosphere 160-9-4.8 MCG/ACT Aero inhaler Generic drug: budesonide-glycopyrrolate-formoterol INHALE TWO PUFFS TWICE DAILY TO PREVENT COUGH OR WHEEZE. RINSE, GARGLE, AND SPIT AFTER USE.   clobetasol ointment 0.05 % Commonly known as: TEMOVATE Apply 1 Application topically daily.    Eliquis  5 MG Tabs tablet Generic drug: apixaban  TAKE 1 TABLET BY MOUTH TWICE A DAY   famotidine  40 MG tablet Commonly known as: PEPCID  TAKE ONE TABLET ONE TO TWO TIMES DAILY AS DIRECTED.   finasteride 5 MG tablet Commonly known as: PROSCAR Take 5 mg by mouth daily.   gabapentin 100 MG capsule Commonly known as: NEURONTIN Take 100-200 mg by mouth at bedtime as needed.   losartan  50 MG tablet Commonly known as: COZAAR  Take 1 tablet (50 mg total) by mouth daily.   methocarbamol 500 MG tablet Commonly known as: ROBAXIN Take 500 mg by mouth 3 (three) times daily as needed.   metoprolol  tartrate 25 MG tablet Commonly known as: LOPRESSOR  TAKE 1 TABLET BY MOUTH TWICE A DAY   PRESERVISION AREDS 2 PO Take 1 tablet by mouth daily.   traMADol  50 MG tablet Commonly known as: ULTRAM  Take 50 mg by mouth 3 (three) times daily as needed.   triamcinolone  55 MCG/ACT Aero nasal inhaler Commonly known as: NASACORT  Place 2 sprays into the nose at bedtime.   Ventolin HFA 108 (90 Base) MCG/ACT inhaler Generic drug: albuterol Inhale 2 puffs into the lungs as needed for wheezing or shortness of breath.   vitamin C 1000 MG tablet Take 1,000 mg by mouth daily.   VITAMIN D3 PO Take 1 tablet by mouth daily.    Past Medical History:  Diagnosis Date  Acute coronary syndrome (HCC) 09/28/2019   Arthritis 01/15/2017   Atrial fibrillation (HCC)    Cancer (HCC)    skin cancer   Chest mass 12/04/2015   Chronic hyponatremia 02/04/2022   Coronary artery disease    Coronary artery disease of native artery of native heart with stable angina pectoris    COVID-19 virus infection 01/30/2020   Cutaneous abscess of chest wall 12/04/2015   Dyslipidemia 02/04/2022   Dyspnea    Dysrhythmia    afib   Encounter for postoperative wound check 02/01/2020   Epidermoid cyst of skin 12/04/2015   GERD (gastroesophageal reflux disease) 01/15/2017   High risk medication use 01/10/2015   Overview:  Overview:   flecanide   Hyperlipidemia 01/15/2017   Ileus (HCC) 02/04/2022   LAE (left atrial enlargement) 01/15/2017   Open wound of chest wall 04/01/2016   PAF (paroxysmal atrial fibrillation) (HCC) 01/10/2015   Overview:  CHADS2 vasc score=1   Pneumonia due to COVID-19 virus 02/04/2022   Postop check 11/03/2019   S/P CABG x 3 10/04/2019   Troponin I above reference range 02/04/2022    Past Surgical History:  Procedure Laterality Date   APPENDECTOMY     CARDIAC CATHETERIZATION     09/23/19   CHOLECYSTECTOMY     CORONARY ARTERY BYPASS GRAFT N/A 10/04/2019   Procedure: CORONARY ARTERY BYPASS GRAFTING (CABG) times three using right greater saphenous vein and left internal mammary ;  Surgeon: Fleeta Hanford Coy, MD;  Location: [facility] OR;  Service: Open Heart Surgery;  Laterality: N/A;   CYST EXCISION     ENDOVEIN HARVEST OF GREATER SAPHENOUS VEIN Right 10/04/2019   Procedure: Jethro Rubins Of Greater Saphenous Vein;  Surgeon: Fleeta Hanford Coy, MD;  Location: [facility] OR;  Service: Open Heart Surgery;  Laterality: Right;   EXCISION OF KELOID N/A 10/04/2019   Procedure: Excision Of Sternal Keloid;  Surgeon: Fleeta Hanford Coy, MD;  Location: [facility] OR;  Service: Open Heart Surgery;  Laterality: N/A;   LAPAROSCOPIC LYSIS INTESTINAL ADHESIONS  2012   LEFT HEART CATH AND CORONARY ANGIOGRAPHY N/A 09/23/2019   Procedure: LEFT HEART CATH AND CORONARY ANGIOGRAPHY;  Surgeon: Claudene Victory ORN, MD;  Location: [facility];  Service: Cardiovascular;  Laterality: N/A;   TEE WITHOUT CARDIOVERSION N/A 10/04/2019   Procedure: TRANSESOPHAGEAL ECHOCARDIOGRAM (TEE);  Surgeon: Fleeta Hanford, Coy, MD;  Location: [facility] OR;  Service: Open Heart Surgery;  Laterality: N/A;    Review of systems negative except as noted in HPI / PMHx or noted below:  Review of Systems  Constitutional: Negative.   HENT: Negative.    Eyes: Negative.   Respiratory: Negative.    Cardiovascular: Negative.   Gastrointestinal: Negative.   Genitourinary: Negative.    Musculoskeletal: Negative.   Skin: Negative.   Neurological: Negative.   Endo/Heme/Allergies: Negative.   Psychiatric/Behavioral: Negative.       Objective:   Vitals:   01/25/24 0958  BP: 128/64  Pulse: (!) 57  Resp: 18  SpO2: 98%          Physical Exam Constitutional:      Appearance: He is not diaphoretic.  HENT:     Head: Normocephalic.     Right Ear: Tympanic membrane, ear canal and external ear normal.     Left Ear: Tympanic membrane, ear canal and external ear normal.     Nose: Nose normal. No mucosal edema or rhinorrhea.     Mouth/Throat:     Pharynx: Uvula midline. No oropharyngeal exudate.  Eyes:  Conjunctiva/sclera: Conjunctivae normal.  Neck:     Thyroid : No thyromegaly.     Trachea: Trachea normal. No tracheal tenderness or tracheal deviation.  Cardiovascular:     Rate and Rhythm: Normal rate and regular rhythm.     Heart sounds: Normal heart sounds, S1 normal and S2 normal. No murmur heard. Pulmonary:     Effort: No respiratory distress.     Breath sounds: Normal breath sounds. No stridor. No wheezing or rales.  Lymphadenopathy:     Head:     Right side of head: No tonsillar adenopathy.     Left side of head: No tonsillar adenopathy.     Cervical: No cervical adenopathy.  Skin:    Findings: No erythema or rash.     Nails: There is no clubbing.  Neurological:     Mental Status: He is alert.     Diagnostics: Spirometry was performed and demonstrated an FEV1 of 2.55 at 89 % of predicted.   Assessment and Plan:   1. Asthma, moderate persistent, well-controlled   2. Perennial allergic rhinitis   3. LPRD (laryngopharyngeal reflux disease)    1.  Continue to treat and prevent inflammation:   A.  Nasacort  - 1 spray each nostril 1-2 time per day  B.  Breztri  - 2 inhalations 1-2 times per day  2. Continue to Treat and prevent reflux:   A. Famotidine  40mg  tablet 1-2 times per day  3. If needed:   A. Nasal saline  B. OTC  antihistamine  C. Albuterol HFA - 2 inhalations every 4-6 hours  4. Influenza = Tamiflu. Covid = Paxlovid  5. Return to clinic in 6 months or earlier if problem  Steve Frye is doing very well on his current therapy which includes anti-inflammatory medicines for both his upper and lower airway and attended to his reflux condition with a H2 receptor blocker.  He will continue on the plan noted above and we will see him back in this clinic in 6 months or earlier if there is a problem.  Camellia Denis, MD Allergy  / Immunology [facility]

## 2024-01-26 ENCOUNTER — Encounter: Payer: Self-pay | Admitting: Allergy and Immunology

## 2024-01-27 DIAGNOSIS — M79605 Pain in left leg: Secondary | ICD-10-CM | POA: Diagnosis not present

## 2024-01-27 DIAGNOSIS — M2560 Stiffness of unspecified joint, not elsewhere classified: Secondary | ICD-10-CM | POA: Diagnosis not present

## 2024-01-27 DIAGNOSIS — M6281 Muscle weakness (generalized): Secondary | ICD-10-CM | POA: Diagnosis not present

## 2024-01-27 DIAGNOSIS — R293 Abnormal posture: Secondary | ICD-10-CM | POA: Diagnosis not present

## 2024-01-27 DIAGNOSIS — M25651 Stiffness of right hip, not elsewhere classified: Secondary | ICD-10-CM | POA: Diagnosis not present

## 2024-01-27 DIAGNOSIS — M25662 Stiffness of left knee, not elsewhere classified: Secondary | ICD-10-CM | POA: Diagnosis not present

## 2024-01-27 DIAGNOSIS — M5442 Lumbago with sciatica, left side: Secondary | ICD-10-CM | POA: Diagnosis not present

## 2024-02-10 DIAGNOSIS — C44622 Squamous cell carcinoma of skin of right upper limb, including shoulder: Secondary | ICD-10-CM | POA: Diagnosis not present

## 2024-02-18 ENCOUNTER — Telehealth: Payer: Self-pay | Admitting: *Deleted

## 2024-02-18 DIAGNOSIS — K219 Gastro-esophageal reflux disease without esophagitis: Secondary | ICD-10-CM | POA: Diagnosis not present

## 2024-02-18 NOTE — Telephone Encounter (Signed)
   Pre-operative Risk Assessment    Patient Name: Steve Frye  DOB: 07/23/1945 MRN: [id number]      Request for Surgical Clearance    Procedure:  EGD  Date of Surgery:  Clearance 03/15/24                                 Surgeon:  Dr. Evalene ALF Misenheimer Surgeon's Group or Practice Name:  [facility] Phone number:  [phone] Fax number:  [phone]   Type of Clearance Requested:   - Pharmacy:  Hold Apixaban  (Eliquis ) Is it okay to stop this medication?   Type of Anesthesia:  Not Indicated   Additional requests/questions:    [name]   02/18/2024, 11:43 AM

## 2024-02-26 NOTE — Telephone Encounter (Signed)
 Primary Cardiologist:Brian Monetta, MD   Preoperative team, please contact this patient and set up a phone call appointment for further preoperative risk assessment. Please obtain consent and complete medication review. Thank you for your help.   I confirm that guidance regarding antiplatelet and oral anticoagulation therapy has been completed and, if necessary, noted below.  Per office protocol, patient can hold Eliquis  for 2 days prior to procedure and should resume as soon as hemodynamically stable post procedure.  I also confirmed the patient resides in the state of [place] . As per [facility] Medical Board telemedicine laws, the patient must reside in the state in which the provider is licensed.   Rosaline EMERSON Bane, NP-C  02/26/2024, 11:24 AM [address], [address] [place], [place] [address] Office [phone] Fax [phone]

## 2024-02-26 NOTE — Telephone Encounter (Signed)
 Patient with diagnosis of afib on Eliquis  for anticoagulation.    Procedure: EGD Date of procedure: 03/15/24   CHA2DS2-VASc Score = 4   This indicates a 4.8% annual risk of stroke. The patient's score is based upon: CHF History: 0 HTN History: 1 Diabetes History: 0 Stroke History: 0 Vascular Disease History: 1 Age Score: 2 Gender Score: 0      CrCl 93 ml/min  Patient has not had an Afib/aflutter ablation in the last 3 months, DCCV within the last 4 weeks or a watchman implanted in the last 45 days   Per office protocol, patient can hold Eliquis  for 2 days prior to procedure.    **This guidance is not considered finalized until pre-operative APP has relayed final recommendations.**

## 2024-02-26 NOTE — Telephone Encounter (Signed)
 Will d/w the preop APP to see if we are able to move up sooner with MD would that be suitable. Preop APP agreeable to move in office appt up sooner if pt agrees.    Pt has been moved to see Dr. Liborio 03/01/24 @ 11:20.

## 2024-02-29 DIAGNOSIS — C44622 Squamous cell carcinoma of skin of right upper limb, including shoulder: Secondary | ICD-10-CM | POA: Diagnosis not present

## 2024-03-01 ENCOUNTER — Ambulatory Visit

## 2024-03-01 VITALS — BP 100/62 | HR 58 | Ht 72.0 in | Wt 218.4 lb

## 2024-03-01 DIAGNOSIS — I480 Paroxysmal atrial fibrillation: Secondary | ICD-10-CM | POA: Diagnosis not present

## 2024-03-01 DIAGNOSIS — E782 Mixed hyperlipidemia: Secondary | ICD-10-CM

## 2024-03-01 DIAGNOSIS — I25118 Atherosclerotic heart disease of native coronary artery with other forms of angina pectoris: Secondary | ICD-10-CM

## 2024-03-01 DIAGNOSIS — Z01810 Encounter for preprocedural cardiovascular examination: Secondary | ICD-10-CM | POA: Diagnosis not present

## 2024-03-01 DIAGNOSIS — I10 Essential (primary) hypertension: Secondary | ICD-10-CM | POA: Diagnosis not present

## 2024-03-01 NOTE — Assessment & Plan Note (Signed)
 Last lipid panel reviewed from 07/31/2023. Total cholesterol 127, HDL 48, LDL 65 and triglycerides 91.  Optimal. Patient atorvastatin 40 mg once daily.

## 2024-03-01 NOTE — Assessment & Plan Note (Signed)
 Abnormal cardiac CT imaging followed by cardiac cath and s/p CABG June 2021. Normal biventricular function pre and post surgery on echocardiogram in 2021.   Last ischemic workup with Lexiscan  stress test April 2022 no ischemia.  Good functional status. No significant change from his baseline. Remains on Eliquis  for A-fib, hence not on aspirin . Would recommend aspirin  when Eliquis  is interrupted for any procedures unless it is prohibitive from a procedural standpoint.  Continue atorvastatin 40 mg once daily. Remains on low-dose metoprolol  to tartrate

## 2024-03-01 NOTE — Progress Notes (Signed)
 Cardiology Consultation:    Date:  03/01/2024   ID:  Steve Frye, DOB Jul 19, 1945, MRN [id number]  PCP:  Street, Lonni HERO, MD  Cardiologist:  Alean SAUNDERS Kandyce Dieguez, MD   Referring MD: Street, Lonni HERO, *   No chief complaint on file.    ASSESSMENT AND PLAN:   [name] 78 year old male with history of coronary artery disease with abnormal cardiac CT imaging May 2021 followed by cardiac cath and subsequently s/p CABG June 2021 with last stress test from April 2022 showing no ischemia on Lexiscan  nuclear imaging, and last echocardiogram available to review from June 2021 LVEF grossly normal with limited imaging due to recent CABG at the time, paroxysmal atrial fibrillation on rhythm control with amiodarone , hyperlipidemia, COPD, prediabetes.    Problem List Items Addressed This Visit     Hyperlipidemia   Last lipid panel reviewed from 07/31/2023. Total cholesterol 127, HDL 48, LDL 65 and triglycerides 91.  Optimal. Patient atorvastatin 40 mg once daily.      Relevant Orders   EKG 12-Lead (Completed)   Coronary artery disease of native artery of native heart with stable angina pectoris - Primary   Abnormal cardiac CT imaging followed by cardiac cath and s/p CABG June 2021. Normal biventricular function pre and post surgery on echocardiogram in 2021.   Last ischemic workup with Lexiscan  stress test April 2022 no ischemia.  Good functional status. No significant change from his baseline. Remains on Eliquis  for A-fib, hence not on aspirin . Would recommend aspirin  when Eliquis  is interrupted for any procedures unless it is prohibitive from a procedural standpoint.  Continue atorvastatin 40 mg once daily. Remains on low-dose metoprolol  to tartrate      Atrial fibrillation (HCC)   Paroxysmal A-fib. Remains in sinus rhythm.  On rhythm control with amiodarone  200 mg once daily. Last TSH 0.668 on 07/31/2023. ALT 26 on 07/31/2023. Has annual eye exams. Follows up with  pulmonologist for his COPD.  CHA2DS2-VASc score 4. On anticoagulation with Eliquis  5 mg twice daily. Tolerating well. Continue the same. Okay to interrupt as needed for upcoming EGD.  Discussed about long-term need for anticoagulation and options for left atrial appendage occluding device such as watchman as an alternative.      Hypertension   Well-controlled. Lower end of normal. Continue losartan  50 mg once daily. Continue metoprolol  tartrate which he is taking 12.5 mg twice daily.       Preop cardiovascular exam   From cardiac standpoint no active chest pain, heart failure or anginal symptoms. Stable rhythm.  Okay to proceed with his elective EGD as being planned. Okay to hold Eliquis  as required for the procedure. Given his history of CAD and CABG would recommend continuing baby aspirin  81 mg once daily while he is off Eliquis .  Aspirin  can be discontinued once he resumes Eliquis  after the procedure.  If aspirin  is considered prohibitive from the procedural standpoint, okay to hold aspirin  as well, after an informed and shared decision making with the surgeon.       RTC in 6 months.    History of Present Illness:    Steve Frye is a 78 y.o. male who is being seen today for follow-up visit. PCP is Street, [name], *.  Visit with me in the office was 09/11/2023.  Here for follow-up visit. Also requesting preop clearance for EGD tentatively being planned for 03/15/2024 with Dr. Larene.  history of coronary artery disease with abnormal cardiac CT imaging May 2021 followed by cardiac  cath and subsequently s/p CABG June 2021 with last stress test from April 2022 showing no ischemia on Lexiscan  nuclear imaging, and last echocardiogram available to review from June 2021 LVEF grossly normal with limited imaging due to recent CABG at the time, paroxysmal atrial fibrillation on rhythm control with amiodarone , hyperlipidemia, COPD, prediabetes.  Keeps himself busy with  activities at home.  [facility]  Can walk up to 3 times before having to take a pause. No chest pain, orthopnea or paroxysmal nocturnal dyspnea. No palpitations, lightheadedness, dizziness or syncopal episodes.  Is currently pending further evaluation with EGD for evaluation of mild anemia.  Denies any noticeable blood in urine or stools.  EKG in the clinic today shows sinus rhythm heart rate 58/min, PR interval 144 ms, QRS duration 112 ms, QTc 451 ms.  No ischemic changes.  Blood work 10/13/2023 BUN 12, creatinine 0.89, eGFR 88 Sodium 139 and potassium 4.4    Past Medical History:  Diagnosis Date   Acute coronary syndrome (HCC) 09/28/2019   Arthritis 01/15/2017   Atrial fibrillation (HCC)    Cancer (HCC)    skin cancer   Chest mass 12/04/2015   Chronic hyponatremia 02/04/2022   Coronary artery disease    Coronary artery disease of native artery of native heart with stable angina pectoris    COVID-19 virus infection 01/30/2020   Cutaneous abscess of chest wall 12/04/2015   Dyslipidemia 02/04/2022   Dyspnea    Dysrhythmia    afib   Encounter for postoperative wound check 02/01/2020   Epidermoid cyst of skin 12/04/2015   GERD (gastroesophageal reflux disease) 01/15/2017   High risk medication use 01/10/2015   Overview:  Overview:  flecanide   Hyperlipidemia 01/15/2017   Hypertension 09/11/2023   Ileus (HCC) 02/04/2022   LAE (left atrial enlargement) 01/15/2017   Open wound of chest wall 04/01/2016   PAF (paroxysmal atrial fibrillation) (HCC) 01/10/2015   Overview:  CHADS2 vasc score=1   Pneumonia due to COVID-19 virus 02/04/2022   Postop check 11/03/2019   S/P CABG x 3 10/04/2019   Troponin I above reference range 02/04/2022    Past Surgical History:  Procedure Laterality Date   APPENDECTOMY     CARDIAC CATHETERIZATION     09/23/19   CHOLECYSTECTOMY     CORONARY ARTERY BYPASS GRAFT N/A 10/04/2019   Procedure: CORONARY ARTERY BYPASS GRAFTING (CABG)  times three using right greater saphenous vein and left internal mammary ;  Surgeon: Fleeta Hanford Coy, MD;  Location: [facility] OR;  Service: Open Heart Surgery;  Laterality: N/A;   CYST EXCISION     ENDOVEIN HARVEST OF GREATER SAPHENOUS VEIN Right 10/04/2019   Procedure: Jethro Rubins Of Greater Saphenous Vein;  Surgeon: Fleeta Hanford Coy, MD;  Location: [facility] OR;  Service: Open Heart Surgery;  Laterality: Right;   EXCISION OF KELOID N/A 10/04/2019   Procedure: Excision Of Sternal Keloid;  Surgeon: Fleeta Hanford Coy, MD;  Location: [facility] OR;  Service: Open Heart Surgery;  Laterality: N/A;   LAPAROSCOPIC LYSIS INTESTINAL ADHESIONS  2012   LEFT HEART CATH AND CORONARY ANGIOGRAPHY N/A 09/23/2019   Procedure: LEFT HEART CATH AND CORONARY ANGIOGRAPHY;  Surgeon: Claudene Victory ORN, MD;  Location: [facility] INVASIVE CV LAB;  Service: Cardiovascular;  Laterality: N/A;   TEE WITHOUT CARDIOVERSION N/A 10/04/2019   Procedure: TRANSESOPHAGEAL ECHOCARDIOGRAM (TEE);  Surgeon: Fleeta Hanford, Coy, MD;  Location: [facility] OR;  Service: Open Heart Surgery;  Laterality: N/A;    Current Medications: Current Meds  Medication Sig   amiodarone  (PACERONE ) 200 MG tablet TAKE 1 TABLET BY MOUTH EVERY DAY   Ascorbic Acid (VITAMIN C) 1000 MG tablet Take 1,000 mg by mouth daily.   atorvastatin (LIPITOR) 40 MG tablet Take 40 mg by mouth at bedtime.   BREZTRI  AEROSPHERE 160-9-4.8 MCG/ACT AERO INHALE TWO PUFFS TWICE DAILY TO PREVENT COUGH OR WHEEZE. RINSE, GARGLE, AND SPIT AFTER USE.   Cholecalciferol (VITAMIN D3 PO) Take 1 tablet by mouth daily.   clobetasol ointment (TEMOVATE) 0.05 % Apply 1 Application topically daily.   ELIQUIS  5 MG TABS tablet TAKE 1 TABLET BY MOUTH TWICE A DAY   famotidine  (PEPCID ) 40 MG tablet TAKE ONE TABLET ONE TO TWO TIMES DAILY AS DIRECTED.   finasteride (PROSCAR) 5 MG tablet Take 5 mg by mouth daily.   gabapentin (NEURONTIN) 100 MG capsule Take 100-200 mg by mouth at bedtime as needed.   losartan  (COZAAR ) 50 MG tablet Take 1  tablet (50 mg total) by mouth daily.   methocarbamol (ROBAXIN) 500 MG tablet Take 500 mg by mouth 3 (three) times daily as needed.   metoprolol  tartrate (LOPRESSOR ) 25 MG tablet TAKE 1 TABLET BY MOUTH TWICE A DAY   Multiple Vitamins-Minerals (PRESERVISION AREDS 2 PO) Take 1 tablet by mouth daily.   Probiotic Product (ALIGN PO) Take 1 tablet by mouth daily.   traMADol  (ULTRAM ) 50 MG tablet Take 50 mg by mouth 3 (three) times daily as needed.   triamcinolone  (NASACORT ) 55 MCG/ACT AERO nasal inhaler Place 2 sprays into the nose at bedtime.   [DISCONTINUED] VENTOLIN HFA 108 (90 Base) MCG/ACT inhaler Inhale 2 puffs into the lungs as needed for wheezing or shortness of breath.     Allergies:   Penicillins   Social History   Socioeconomic History   Marital status: Married    Spouse name: Not on file   Number of children: Not on file   Years of education: Not on file   Highest education level: Not on file  Occupational History   Not on file  Tobacco Use   Smoking status: Former    Current packs/day: 0.00    Types: Cigarettes    Start date: 01/10/1959    Quit date: 01/10/1979    Years since quitting: 45.1   Smokeless tobacco: Never  Vaping Use   Vaping status: Never Used  Substance and Sexual Activity   Alcohol use: No   Drug use: No   Sexual activity: Not on file  Other Topics Concern   Not on file  Social History Narrative   Not on file   Social Drivers of Health   Financial Resource Strain: Not on file  Food Insecurity: Not on file  Transportation Needs: Not on file  Physical Activity: Not on file  Stress: Not on file  Social Connections: Not on file     Family History: The patient's family history includes Heart disease in his father; Heart failure in his father. ROS:   Please see the history of present illness.    All 14 point review of systems negative except as described per history of present illness.  EKGs/Labs/Other Studies Reviewed:    The following studies  were reviewed today:   EKG:  EKG Interpretation Date/Time:  Tuesday March 01 2024 11:29:24 EST Ventricular Rate:  58 PR Interval:  144 QRS Duration:  112 QT Interval:  460 QTC Calculation: 451 R Axis:   -33  Text Interpretation: Sinus bradycardia Left axis deviation Abnormal ECG When compared with ECG  of 11-Sep-2023 08:01, Premature atrial complexes are no longer Present Confirmed by Liborio Hai reddy [phone]) on 03/01/2024 12:00:17 PM    Recent Labs: 10/13/2023: BUN 12; Creatinine, Ser 0.89; Potassium 4.4; Sodium 139  Recent Lipid Panel    Component Value Date/Time   CHOL 133 02/12/2023 0922   TRIG 87 02/12/2023 0922   HDL 51 02/12/2023 0922   CHOLHDL 2.6 02/12/2023 0922   LDLCALC 65 02/12/2023 0922    Physical Exam:    VS:  BP 100/62   Pulse (!) 58   Ht 6' (1.829 m)   Wt 218 lb 6.4 oz (99.1 kg)   SpO2 99%   BMI 29.62 kg/m     Wt Readings from Last 3 Encounters:  03/01/24 218 lb 6.4 oz (99.1 kg)  09/11/23 215 lb 9.6 oz (97.8 kg)  06/29/23 217 lb (98.4 kg)     GENERAL:  Well nourished, well developed in no acute distress NECK: No JVD; No carotid bruits CARDIAC: RRR, S1 and S2 present, no murmurs, no rubs, no gallops CHEST:  Clear to auscultation without rales, wheezing or rhonchi  Extremities: No pitting pedal edema. Pulses bilaterally symmetric with radial 2+ and dorsalis pedis 2+ NEUROLOGIC:  Alert and oriented x 3  Medication Adjustments/Labs and Tests Ordered: Current medicines are reviewed at length with the patient today.  Concerns regarding medicines are outlined above.  Orders Placed This Encounter  Procedures   EKG 12-Lead   No orders of the defined types were placed in this encounter.   Signed, Gennett Garcia reddy [place], MD, MPH, [facility]. 03/01/2024 1:09 PM    [facility]

## 2024-03-01 NOTE — Assessment & Plan Note (Signed)
 From cardiac standpoint no active chest pain, heart failure or anginal symptoms. Stable rhythm.  Okay to proceed with his elective EGD as being planned. Okay to hold Eliquis  as required for the procedure. Given his history of CAD and CABG would recommend continuing baby aspirin  81 mg once daily while he is off Eliquis .  Aspirin  can be discontinued once he resumes Eliquis  after the procedure.  If aspirin  is considered prohibitive from the procedural standpoint, okay to hold aspirin  as well, after an informed and shared decision making with the surgeon.

## 2024-03-01 NOTE — Assessment & Plan Note (Addendum)
 Paroxysmal A-fib. Remains in sinus rhythm.  On rhythm control with amiodarone  200 mg once daily. Last TSH 0.668 on 07/31/2023. ALT 26 on 07/31/2023. Has annual eye exams. Follows up with pulmonologist for his COPD.  CHA2DS2-VASc score 4. On anticoagulation with Eliquis  5 mg twice daily. Tolerating well. Continue the same. Okay to interrupt as needed for upcoming EGD.  Discussed about long-term need for anticoagulation and options for left atrial appendage occluding device such as watchman as an alternative.

## 2024-03-01 NOTE — Assessment & Plan Note (Signed)
 Well-controlled. Lower end of normal. Continue losartan  50 mg once daily. Continue metoprolol  tartrate which he is taking 12.5 mg twice daily.

## 2024-03-01 NOTE — Patient Instructions (Signed)
 Medication Instructions:  Your physician recommends that you continue on your current medications as directed. Please refer to the Current Medication list given to you today.  *If you need a refill on your cardiac medications before your next appointment, please call your pharmacy*  Lab Work: None If you have labs (blood work) drawn today and your tests are completely normal, you will receive your results only by: MyChart Message (if you have MyChart) OR A paper copy in the mail If you have any lab test that is abnormal or we need to change your treatment, we will call you to review the results.  Testing/Procedures: None  Follow-Up: At [facility], you and your health needs are our priority.  As part of our continuing mission to provide you with exceptional heart care, our providers are all part of one team.  This team includes your primary Cardiologist (physician) and Advanced Practice Providers or APPs (Physician Assistants and Nurse Practitioners) who all work together to provide you with the care you need, when you need it.  Your next appointment:   6 month(s)  Provider:   Alean Kobus, MD    We recommend signing up for the patient portal called MyChart.  Sign up information is provided on this After Visit Summary.  MyChart is used to connect with patients for Virtual Visits (Telemedicine).  Patients are able to view lab/test results, encounter notes, upcoming appointments, etc.  Non-urgent messages can be sent to your provider as well.   To learn more about what you can do with MyChart, go to [email or web address].   Other Instructions Ok to proceed with procedure and hold Eliquis  as needed per the procedure. Dr. Kobus would also like you to take aspirin  81 mg daily while off Eliquis  if ok with Dr. Madelyn.

## 2024-03-14 ENCOUNTER — Ambulatory Visit

## 2024-03-15 DIAGNOSIS — Z951 Presence of aortocoronary bypass graft: Secondary | ICD-10-CM | POA: Diagnosis not present

## 2024-03-15 DIAGNOSIS — D649 Anemia, unspecified: Secondary | ICD-10-CM | POA: Diagnosis not present

## 2024-03-15 DIAGNOSIS — Z7982 Long term (current) use of aspirin: Secondary | ICD-10-CM | POA: Diagnosis not present

## 2024-03-15 DIAGNOSIS — I10 Essential (primary) hypertension: Secondary | ICD-10-CM | POA: Diagnosis not present

## 2024-03-15 DIAGNOSIS — Z860100 Personal history of colon polyps, unspecified: Secondary | ICD-10-CM | POA: Diagnosis not present

## 2024-03-15 DIAGNOSIS — J449 Chronic obstructive pulmonary disease, unspecified: Secondary | ICD-10-CM | POA: Diagnosis not present

## 2024-03-15 DIAGNOSIS — Z880 Allergy status to penicillin: Secondary | ICD-10-CM | POA: Diagnosis not present

## 2024-03-15 DIAGNOSIS — Z79899 Other long term (current) drug therapy: Secondary | ICD-10-CM | POA: Diagnosis not present

## 2024-03-15 DIAGNOSIS — K219 Gastro-esophageal reflux disease without esophagitis: Secondary | ICD-10-CM | POA: Diagnosis not present

## 2024-03-15 DIAGNOSIS — E785 Hyperlipidemia, unspecified: Secondary | ICD-10-CM | POA: Diagnosis not present

## 2024-03-15 DIAGNOSIS — D509 Iron deficiency anemia, unspecified: Secondary | ICD-10-CM | POA: Diagnosis not present

## 2024-03-24 ENCOUNTER — Other Ambulatory Visit: Payer: Self-pay | Admitting: Allergy and Immunology

## 2024-04-16 ENCOUNTER — Other Ambulatory Visit: Payer: Self-pay | Admitting: Allergy and Immunology

## 2024-05-13 ENCOUNTER — Other Ambulatory Visit: Payer: Self-pay

## 2024-05-13 DIAGNOSIS — I480 Paroxysmal atrial fibrillation: Secondary | ICD-10-CM

## 2024-05-13 NOTE — Telephone Encounter (Signed)
 Eliquis  5mg  refill request received. Patient is 79 years old, weight-99.1kg, Crea-0.89 on 10/13/23, Diagnosis-Afib, and last seen by Dr. Liborio on 03/01/24. Dose is appropriate based on dosing criteria. Will send in refill to requested pharmacy.

## 2024-07-25 ENCOUNTER — Ambulatory Visit: Admitting: Allergy and Immunology
